# Patient Record
Sex: Female | Born: 1940 | Race: White | Hispanic: No | Marital: Married | Smoking: Former smoker
Health system: Southern US, Community
[De-identification: ages and names within clinical notes are randomized; demographics above are authoritative.]

## PROBLEM LIST (undated history)

## (undated) DIAGNOSIS — C7931 Secondary malignant neoplasm of brain: Secondary | ICD-10-CM

## (undated) DIAGNOSIS — I10 Essential (primary) hypertension: Secondary | ICD-10-CM

## (undated) DIAGNOSIS — C50919 Malignant neoplasm of unspecified site of unspecified female breast: Secondary | ICD-10-CM

## (undated) DIAGNOSIS — J45909 Unspecified asthma, uncomplicated: Secondary | ICD-10-CM

## (undated) DIAGNOSIS — C7800 Secondary malignant neoplasm of unspecified lung: Secondary | ICD-10-CM

## (undated) HISTORY — PX: BREAST LUMPECTOMY: SHX2

---

## 2016-01-03 ENCOUNTER — Other Ambulatory Visit: Payer: Self-pay | Admitting: Internal Medicine

## 2016-01-03 DIAGNOSIS — Z1231 Encounter for screening mammogram for malignant neoplasm of breast: Secondary | ICD-10-CM

## 2016-09-18 ENCOUNTER — Emergency Department (HOSPITAL_COMMUNITY): Payer: Medicare Other

## 2016-09-18 ENCOUNTER — Encounter (HOSPITAL_COMMUNITY): Payer: Self-pay | Admitting: Emergency Medicine

## 2016-09-18 ENCOUNTER — Inpatient Hospital Stay (HOSPITAL_COMMUNITY)
Admission: EM | Admit: 2016-09-18 | Discharge: 2016-09-24 | DRG: 054 | Disposition: A | Discharge status: Home Health | Payer: Medicare Other | Attending: Internal Medicine | Admitting: Internal Medicine

## 2016-09-18 DIAGNOSIS — C801 Malignant (primary) neoplasm, unspecified: Secondary | ICD-10-CM

## 2016-09-18 DIAGNOSIS — G936 Cerebral edema: Secondary | ICD-10-CM | POA: Diagnosis present

## 2016-09-18 DIAGNOSIS — R590 Localized enlarged lymph nodes: Secondary | ICD-10-CM | POA: Diagnosis present

## 2016-09-18 DIAGNOSIS — R112 Nausea with vomiting, unspecified: Secondary | ICD-10-CM

## 2016-09-18 DIAGNOSIS — Z923 Personal history of irradiation: Secondary | ICD-10-CM

## 2016-09-18 DIAGNOSIS — C7931 Secondary malignant neoplasm of brain: Principal | ICD-10-CM | POA: Diagnosis present

## 2016-09-18 DIAGNOSIS — I119 Hypertensive heart disease without heart failure: Secondary | ICD-10-CM | POA: Diagnosis present

## 2016-09-18 DIAGNOSIS — I495 Sick sinus syndrome: Secondary | ICD-10-CM | POA: Diagnosis present

## 2016-09-18 DIAGNOSIS — I4891 Unspecified atrial fibrillation: Secondary | ICD-10-CM

## 2016-09-18 DIAGNOSIS — H9203 Otalgia, bilateral: Secondary | ICD-10-CM | POA: Diagnosis present

## 2016-09-18 DIAGNOSIS — R600 Localized edema: Secondary | ICD-10-CM | POA: Diagnosis present

## 2016-09-18 DIAGNOSIS — Z7982 Long term (current) use of aspirin: Secondary | ICD-10-CM

## 2016-09-18 DIAGNOSIS — C7800 Secondary malignant neoplasm of unspecified lung: Secondary | ICD-10-CM | POA: Diagnosis present

## 2016-09-18 DIAGNOSIS — C50911 Malignant neoplasm of unspecified site of right female breast: Secondary | ICD-10-CM | POA: Diagnosis present

## 2016-09-18 DIAGNOSIS — J45909 Unspecified asthma, uncomplicated: Secondary | ICD-10-CM | POA: Diagnosis present

## 2016-09-18 DIAGNOSIS — D496 Neoplasm of unspecified behavior of brain: Secondary | ICD-10-CM | POA: Diagnosis present

## 2016-09-18 DIAGNOSIS — C799 Secondary malignant neoplasm of unspecified site: Secondary | ICD-10-CM

## 2016-09-18 DIAGNOSIS — Z79899 Other long term (current) drug therapy: Secondary | ICD-10-CM

## 2016-09-18 DIAGNOSIS — R278 Other lack of coordination: Secondary | ICD-10-CM | POA: Diagnosis present

## 2016-09-18 DIAGNOSIS — D259 Leiomyoma of uterus, unspecified: Secondary | ICD-10-CM | POA: Diagnosis present

## 2016-09-18 DIAGNOSIS — R001 Bradycardia, unspecified: Secondary | ICD-10-CM

## 2016-09-18 DIAGNOSIS — I6340 Cerebral infarction due to embolism of unspecified cerebral artery: Secondary | ICD-10-CM | POA: Diagnosis present

## 2016-09-18 DIAGNOSIS — Z853 Personal history of malignant neoplasm of breast: Secondary | ICD-10-CM

## 2016-09-18 DIAGNOSIS — I10 Essential (primary) hypertension: Secondary | ICD-10-CM

## 2016-09-18 DIAGNOSIS — R900 Intracranial space-occupying lesion found on diagnostic imaging of central nervous system: Secondary | ICD-10-CM

## 2016-09-18 DIAGNOSIS — I6523 Occlusion and stenosis of bilateral carotid arteries: Secondary | ICD-10-CM | POA: Diagnosis present

## 2016-09-18 DIAGNOSIS — I639 Cerebral infarction, unspecified: Secondary | ICD-10-CM

## 2016-09-18 HISTORY — DX: Unspecified asthma, uncomplicated: J45.909

## 2016-09-18 HISTORY — DX: Malignant neoplasm of unspecified site of unspecified female breast: C50.919

## 2016-09-18 HISTORY — DX: Essential (primary) hypertension: I10

## 2016-09-18 LAB — BASIC METABOLIC PANEL
ANION GAP: 9 (ref 5–15)
BUN: 12 mg/dL (ref 6–20)
CALCIUM: 9.6 mg/dL (ref 8.9–10.3)
CO2: 26 mmol/L (ref 22–32)
Chloride: 103 mmol/L (ref 101–111)
Creatinine, Ser: 0.74 mg/dL (ref 0.44–1.00)
GFR calc Af Amer: >60 mL/min (ref >60)
GFR calc non Af Amer: >60 mL/min (ref >60)
GLUCOSE: 112 mg/dL — AB (ref 65–99)
POTASSIUM: 3.6 mmol/L (ref 3.5–5.1)
Sodium: 138 mmol/L (ref 135–145)

## 2016-09-18 LAB — URINALYSIS, ROUTINE W REFLEX MICROSCOPIC
BACTERIA UA: NONE SEEN
Bilirubin Urine: NEGATIVE
GLUCOSE, UA: NEGATIVE mg/dL
KETONES UR: 80 mg/dL — AB
Nitrite: NEGATIVE
PROTEIN: 30 mg/dL — AB
Specific Gravity, Urine: 1.027 (ref 1.005–1.030)
pH: 5 (ref 5.0–8.0)

## 2016-09-18 LAB — RAPID STREP SCREEN (MED CTR MEBANE ONLY): Streptococcus, Group A Screen (Direct): NEGATIVE

## 2016-09-18 LAB — CBC
HEMATOCRIT: 43.3 % (ref 36.0–46.0)
HEMOGLOBIN: 14.9 g/dL (ref 12.0–15.0)
MCH: 32.2 pg (ref 26.0–34.0)
MCHC: 34.4 g/dL (ref 30.0–36.0)
MCV: 93.5 fL (ref 78.0–100.0)
Platelets: 286 10*3/uL (ref 150–400)
RBC: 4.63 MIL/uL (ref 3.87–5.11)
RDW: 12.1 % (ref 11.5–15.5)
WBC: 10.8 10*3/uL — ABNORMAL HIGH (ref 4.0–10.5)

## 2016-09-18 LAB — I-STAT TROPONIN, ED: Troponin i, poc: 0 ng/mL (ref 0.00–0.08)

## 2016-09-18 LAB — CBG MONITORING, ED: Glucose-Capillary: 107 mg/dL — ABNORMAL HIGH (ref 65–99)

## 2016-09-18 MED ORDER — FENTANYL CITRATE (PF) 100 MCG/2ML IJ SOLN
50.0000 ug | Freq: Once | INTRAMUSCULAR | Status: AC
  Administered 2016-09-18: 50 ug via INTRAVENOUS
  Filled 2016-09-18: qty 2

## 2016-09-18 MED ORDER — SODIUM CHLORIDE 0.9 % IV BOLUS (SEPSIS)
1000.0000 mL | Freq: Once | INTRAVENOUS | Status: AC
  Administered 2016-09-18: 1000 mL via INTRAVENOUS

## 2016-09-18 MED ORDER — ONDANSETRON HCL 4 MG/2ML IJ SOLN
4.0000 mg | Freq: Once | INTRAMUSCULAR | Status: AC
  Administered 2016-09-18: 4 mg via INTRAVENOUS
  Filled 2016-09-18: qty 2

## 2016-09-18 NOTE — ED Provider Notes (Signed)
[facility] DEPT Provider Note   CSN: [id number] Arrival date & time: 09/18/16  1533     History   Chief Complaint Chief Complaint  Patient presents with  . Emesis  . Generalized Body Aches  . Weakness    HPI Deanna Washington is a 76 y.o. female past medical history of asthma who presents with 3 days of generalized body aches/weakness, chills, nausea/vomiting. Patient states that she has not been able to tolerate any PO. She reports that she has had multiple episodes of nonbloody, nonbilious vomiting since onset. Patient reports that she also experiencedgeneralized weakness associated from the aches. No focal weakness complaints. Patient also reports gradually worsening headache that has been ongoing for the past few days. She states that headache is worsened when laying down and with positional changes. Patient states that she has not taken any medications for the pain. Patient reports that initially when symptoms began, she began experiencing some dizziness but states that has improved. Patient states that she also aches around her bilateral jaw, face and ears. She reports some bilateral ear pain, right greater than left. She reports that her granddaughter was recently diagnosed with strep and she had been around. Patient denies any fever, chest pain, difficulty breathing, abdominal pain, diarrhea, melena, dysuria, hematuria.  The history is provided by the patient.    Past Medical History:  Diagnosis Date  . Asthma     Patient Active Problem List   Diagnosis Date Noted  . Brain tumor ([place]) 09/19/2016    History reviewed. No pertinent surgical history.  OB History    No data available       Home Medications    Prior to Admission medications   Medication Sig Start Date End Date Taking? Authorizing Provider  ADVAIR DISKUS 500-50 MCG/DOSE AEPB Place 1 puff into alternate nostrils 2 (two) times daily. 08/23/16  Yes [provider]  aspirin 325 MG tablet Take 325  mg by mouth once.   Yes [provider]  diphenhydrAMINE (BENADRYL) 25 mg capsule Take 25 mg by mouth every 6 (six) hours as needed for sleep.   Yes [provider]  losartan-hydrochlorothiazide (HYZAAR) 50-12.5 MG tablet Take 1 tablet by mouth daily. 07/06/16  Yes [provider]  PROAIR HFA 108 (90 Base) MCG/ACT inhaler Place 1-2 puffs into alternate nostrils every 4 (four) hours as needed for wheezing. 08/01/16  Yes [provider]    Family History No family history on file.  Social History Social History  Substance Use Topics  . Smoking status: Never Smoker  . Smokeless tobacco: Never Used  . Alcohol use Not on file     Allergies   Patient has no known allergies.   Review of Systems Review of Systems  Constitutional: Negative for chills and fever.  HENT: Positive for ear pain. Negative for congestion and sore throat.   Eyes: Negative for visual disturbance.  Respiratory: Negative for cough and shortness of breath.   Cardiovascular: Negative for chest pain.  Gastrointestinal: Positive for nausea and vomiting. Negative for abdominal pain and diarrhea.  Genitourinary: Negative for dysuria and hematuria.  Musculoskeletal: Negative for back pain and neck pain.  Skin: Negative for rash.  Neurological: Positive for dizziness (improved), weakness (Generalized) and headaches. Negative for numbness.  Psychiatric/Behavioral: Negative for confusion.  All other systems reviewed and are negative.    Physical Exam Updated Vital Signs BP (!) 144/86 (BP Location: Right Arm)   Pulse (!) 51   Temp 98.1 F (36.7 C) (Oral)  Resp 19   Ht 5\' 8"  (1.727 m)   Wt 67.1 kg (148 lb)   SpO2 97%   BMI 22.50 kg/m   Physical Exam  Constitutional: She is oriented to person, place, and time. She appears well-developed and well-nourished.  Appears uncomfortable but no acute distress  HENT:  Head: Normocephalic and atraumatic.  Right Ear: Tympanic membrane  normal.  Left Ear: Tympanic membrane normal.  Mouth/Throat: Oropharynx is clear and moist and mucous membranes are normal.  Bilateral external and auditory canals are erythematous.  Eyes: Pupils are equal, round, and reactive to light. Conjunctivae, EOM and lids are normal.  Neck: Full passive range of motion without pain.  Cardiovascular: Normal rate, regular rhythm, normal heart sounds and normal pulses.  Exam reveals no gallop and no friction rub.   No murmur heard. Pulmonary/Chest: Effort normal and breath sounds normal.  Abdominal: Soft. Normal appearance. There is no tenderness. There is no rigidity and no guarding.  Musculoskeletal: Normal range of motion.  Neurological: She is alert and oriented to person, place, and time.  Cranial nerves III-XII intact Follows commands, Moves all extremities  5/5 strength to BUE and BLE  Sensation intact throughout all major nerve distributions Normal finger to nose. No dysdiadochokinesia. No pronator drift. No slurred speech. No facial droop.   Skin: Skin is warm and dry. Capillary refill takes less than 2 seconds.  Psychiatric: She has a normal mood and affect. Her speech is normal.  Nursing note and vitals reviewed.    ED Treatments / Results  Labs (all labs ordered are listed, but only abnormal results are displayed) Labs Reviewed  BASIC METABOLIC PANEL - Abnormal; Notable for the following:       Result Value   Glucose, Bld 112 (*)    All other components within normal limits  CBC - Abnormal; Notable for the following:    WBC 10.8 (*)    All other components within normal limits  URINALYSIS, ROUTINE W REFLEX MICROSCOPIC - Abnormal; Notable for the following:    Color, Urine AMBER (*)    APPearance HAZY (*)    Hgb urine dipstick SMALL (*)    Ketones, ur 80 (*)    Protein, ur 30 (*)    Leukocytes, UA MODERATE (*)    Squamous Epithelial / LPF 0-5 (*)    All other components within normal limits  CBG MONITORING, ED - Abnormal;  Notable for the following:    Glucose-Capillary 107 (*)    All other components within normal limits  RAPID STREP SCREEN (NOT AT [facility])  CULTURE, GROUP A STREP [facility])  ALT  AST  LIPASE, BLOOD  COMPREHENSIVE METABOLIC PANEL  CBC  I-STAT TROPONIN, ED    EKG  EKG Interpretation  Date/Time:  Wednesday September 18 2016 16:03:55 EDT Ventricular Rate:  59 PR Interval:    QRS Duration: 100 QT Interval:  426 QTC Calculation: 422 R Axis:   116 Text Interpretation:  Sinus rhythm Anteroseptal infarct, age indeterminate No acute changes No significant change since last tracing Confirmed by Varney Biles [phone]) on 09/18/2016 6:53:19 PM       Radiology Dg Chest 2 View  Result Date: 09/18/2016 CLINICAL DATA:  Generalized body aches, fever, chills, nausea and vomiting for 3 days. EXAM: CHEST  2 VIEW COMPARISON:  None. FINDINGS: There is cardiomegaly and vascular congestion. No consolidative process or pneumothorax. Trace bilateral pleural effusions noted. No acute bony abnormality. IMPRESSION: Cardiomegaly and pulmonary vascular congestion. Trace bilateral pleural effusions. Electronically Signed  By: Inge Rise M.D.   On: 09/18/2016 19:53   Ct Head Wo Contrast  Result Date: 09/18/2016 CLINICAL DATA:  Generalized body aches, fever, chills, nausea and vomiting for 3 days. RIGHT eye and ear pain. EXAM: CT HEAD WITHOUT CONTRAST TECHNIQUE: Contiguous axial images were obtained from the base of the skull through the vertex without intravenous contrast. COMPARISON:  None. FINDINGS: BRAIN: 4.3 x 7.6 cm rounded hypodensity with heterogeneous component RIGHT prop occipital lobe effacing the RIGHT atrium. Surrounding apparent vasogenic edema. Local mass-effect, 3 mm RIGHT to LEFT midline shift. No intraparenchymal hemorrhage. No hydrocephalus. No abnormal extra-axial fluid collections. VASCULAR: Moderate calcific atherosclerosis of the carotid siphons. SKULL: No skull fracture. No significant scalp  soft tissue swelling. SINUSES/ORBITS: Trace paranasal sinus mucosal thickening. Mastoid air cells are well aerated. The included ocular globes and orbital contents are non-suspicious. Status post bilateral ocular lens implants. OTHER: None. IMPRESSION: 1. Large RIGHT parieto-occipital masslike hypodensity, differential diagnosis includes tumor/metastasis, cerebritis or less likely subacute MCA/ posterior watershed territory infarct. 3 mm RIGHT to LEFT midline shift. Recommend MRI of the brain with and without contrast. Acute findings discussed with and reconfirmed by [facility] NANAVATI on 09/18/2016 at 10:20 pm. Electronically Signed   By: Elon Alas M.D.   On: 09/18/2016 22:23    Procedures Procedures (including critical care time)  Medications Ordered in ED Medications  dexamethasone (DECADRON) injection 4 mg (not administered)  acetaminophen (TYLENOL) tablet 650 mg (not administered)  morphine 2 MG/ML injection 2 mg (not administered)  ondansetron (ZOFRAN) injection 4 mg (not administered)  pantoprazole (PROTONIX) injection 40 mg (not administered)  mometasone-formoterol (DULERA) 200-5 MCG/ACT inhaler 2 puff (not administered)  diphenhydrAMINE (BENADRYL) capsule 25 mg (not administered)  losartan-hydrochlorothiazide (HYZAAR) 50-12.5 MG per tablet 1 tablet (not administered)  albuterol (PROVENTIL) (2.5 MG/3ML) 0.083% nebulizer solution 3 mL (not administered)  enoxaparin (LOVENOX) injection 40 mg (not administered)  0.9 %  sodium chloride infusion (not administered)  ondansetron (ZOFRAN) injection 4 mg (4 mg Intravenous Given 09/18/16 1730)  sodium chloride 0.9 % bolus 1,000 mL (0 mLs Intravenous Stopped 09/18/16 1852)  fentaNYL (SUBLIMAZE) injection 50 mcg (50 mcg Intravenous Given 09/18/16 2123)     Initial Impression / Assessment and Plan / ED Course  I have reviewed the triage vital signs and the nursing notes.  Pertinent labs & imaging results that were available during my care  of the patient were reviewed by me and considered in my medical decision making (see chart for details).     76 y.o. female who presents with 2 days of generalized weakness, nausea/vomiting. Also associated with gradually worsening headache for the last 3 days. No focal neuro complaints. No history of trauma/fall.  Patient is afebrile, non-toxic appearing, sitting comfortably on examination table. Vital signs reviewed and stable. Patient is hypertensive. She has not taken any hypertensive medications today. No focal neuro deficits. No abdominal tenderness on exam. Consider vertigo versus dehydration versus electrolyte imbalance versus acute infectious etiology. History/physical exam are not concerning for meningitis. Plan to check basic labs including CBC, BMP, UA, troponin, EKG, rapid strep, CXR. IVF given for fluid resuscitation. Antiemetics given in the department.  Labs and imaging reviewed. CBC shows slight leukocytosis. BMP is unremarkable. Troponin negative. Rapid strep negative. EKG shows sinus bradycardia, rate 59. No priors for comparison. Chest x-ray shows cardiomegaly but no acute infectious etiology.  Discussed results with patient. She reports that she has had no more episodes of vomiting while in the department. Will plan to  fluid challenge her. Patient reports that she is still expressing headache. Will plan to give analgesics for pain relief. Discussed patient with Dr. Kathrynn Humble. Will plan to evaluate patient in the ED.   Discussed with Dr. Kathrynn Humble after evaluation of the patient in the ED. during his exam, patient was slightly tremulous with finger to nose. Patient and played in the department but had an unsteady gait. Given neuro findings and history of worsening headache, Will order a CT head for further evaluation.  CT head reviewed. There is evidence of a large right parieto-occipital masslike hypodensity that could indicate tumor, metastasis, there is possibility that it could be a  subacute MCA/posterior watershed infarct but that is less likely. There is also a 3 mm right and left midline shift. There is recommendation for further MRI evaluation. Given findings on CT and patient's symptoms, will likely need admission for further workup. Plan to consult hospitalist for further admission.  Discussed patient with Dr. Albertine Grates. Would like to have neurosurgery consulted before admission for their recommendation. Will consult neurosurgery.  Discussed patient with Dr. Saintclair Halsted (neurosurgery). Regarding CT findings. He agrees that patient will likely need admission for further MRI evaluation of the brain. Will plan to consult on patient tomorrow during admission.  Discussed with Dr. Albertine Grates. Would like oncology consultation for their recommendation for Decadron dosing and further suggestion.  Discussed patient with Dr. Alen Blew (Oncology). Agrees with plan to admit. Recommends that patient have an MRI of the brain for further evaluation of the mass found on CT. Also recommends full body CT scan for evaluation of metastasis. Would like neurosurgery to consult on patient during admission. He will plan to consult on patient after patient has had medical workup and neurosurgery consultation. He recommends that patient be started on Decadron 4 mg every 6 hours.  Discussed consult with oncology with Dr. Albertine Grates.    Final Clinical Impressions(s) / ED Diagnoses   Final diagnoses:  Intracranial mass  Nausea and vomiting, intractability of vomiting not specified, unspecified vomiting type    New Prescriptions New Prescriptions   No medications on file     Desma Mcgregor 09/19/16 Louise, Ankit, MD 09/19/16 [phone]

## 2016-09-18 NOTE — ED Triage Notes (Signed)
Patient here from home with complaints of generalized body aches, fever, chills, nausea/vomiting x3 days. Pain increased in right ear and behind right eye.

## 2016-09-19 ENCOUNTER — Encounter (HOSPITAL_COMMUNITY): Payer: Self-pay | Admitting: Radiology

## 2016-09-19 ENCOUNTER — Ambulatory Visit
Admit: 2016-09-19 | Discharge: 2016-09-19 | Disposition: A | Discharge status: Home / Self Care | Payer: Medicare Other | Source: Ambulatory Visit | Attending: Radiation Oncology | Admitting: Radiation Oncology

## 2016-09-19 ENCOUNTER — Inpatient Hospital Stay (HOSPITAL_COMMUNITY): Payer: Medicare Other

## 2016-09-19 DIAGNOSIS — R900 Intracranial space-occupying lesion found on diagnostic imaging of central nervous system: Secondary | ICD-10-CM

## 2016-09-19 DIAGNOSIS — R001 Bradycardia, unspecified: Secondary | ICD-10-CM

## 2016-09-19 DIAGNOSIS — R51 Headache: Secondary | ICD-10-CM | POA: Diagnosis not present

## 2016-09-19 DIAGNOSIS — C799 Secondary malignant neoplasm of unspecified site: Secondary | ICD-10-CM | POA: Diagnosis not present

## 2016-09-19 DIAGNOSIS — M7989 Other specified soft tissue disorders: Secondary | ICD-10-CM

## 2016-09-19 DIAGNOSIS — I639 Cerebral infarction, unspecified: Secondary | ICD-10-CM

## 2016-09-19 DIAGNOSIS — I495 Sick sinus syndrome: Secondary | ICD-10-CM

## 2016-09-19 DIAGNOSIS — I361 Nonrheumatic tricuspid (valve) insufficiency: Secondary | ICD-10-CM | POA: Diagnosis not present

## 2016-09-19 DIAGNOSIS — H9203 Otalgia, bilateral: Secondary | ICD-10-CM | POA: Diagnosis present

## 2016-09-19 DIAGNOSIS — D496 Neoplasm of unspecified behavior of brain: Secondary | ICD-10-CM

## 2016-09-19 DIAGNOSIS — J45909 Unspecified asthma, uncomplicated: Secondary | ICD-10-CM

## 2016-09-19 DIAGNOSIS — I10 Essential (primary) hypertension: Secondary | ICD-10-CM | POA: Diagnosis not present

## 2016-09-19 DIAGNOSIS — I119 Hypertensive heart disease without heart failure: Secondary | ICD-10-CM | POA: Diagnosis present

## 2016-09-19 DIAGNOSIS — B370 Candidal stomatitis: Secondary | ICD-10-CM | POA: Insufficient documentation

## 2016-09-19 DIAGNOSIS — C7800 Secondary malignant neoplasm of unspecified lung: Secondary | ICD-10-CM | POA: Diagnosis present

## 2016-09-19 DIAGNOSIS — R278 Other lack of coordination: Secondary | ICD-10-CM | POA: Diagnosis present

## 2016-09-19 DIAGNOSIS — Z923 Personal history of irradiation: Secondary | ICD-10-CM | POA: Diagnosis not present

## 2016-09-19 DIAGNOSIS — I638 Other cerebral infarction: Secondary | ICD-10-CM | POA: Diagnosis not present

## 2016-09-19 DIAGNOSIS — R42 Dizziness and giddiness: Secondary | ICD-10-CM | POA: Diagnosis not present

## 2016-09-19 DIAGNOSIS — G939 Disorder of brain, unspecified: Secondary | ICD-10-CM | POA: Diagnosis not present

## 2016-09-19 DIAGNOSIS — Z853 Personal history of malignant neoplasm of breast: Secondary | ICD-10-CM | POA: Diagnosis not present

## 2016-09-19 DIAGNOSIS — J90 Pleural effusion, not elsewhere classified: Secondary | ICD-10-CM | POA: Diagnosis not present

## 2016-09-19 DIAGNOSIS — I4891 Unspecified atrial fibrillation: Secondary | ICD-10-CM | POA: Diagnosis not present

## 2016-09-19 DIAGNOSIS — Z7982 Long term (current) use of aspirin: Secondary | ICD-10-CM | POA: Diagnosis not present

## 2016-09-19 DIAGNOSIS — Z79899 Other long term (current) drug therapy: Secondary | ICD-10-CM | POA: Diagnosis not present

## 2016-09-19 DIAGNOSIS — R600 Localized edema: Secondary | ICD-10-CM | POA: Diagnosis present

## 2016-09-19 DIAGNOSIS — C50911 Malignant neoplasm of unspecified site of right female breast: Secondary | ICD-10-CM | POA: Diagnosis present

## 2016-09-19 DIAGNOSIS — C7931 Secondary malignant neoplasm of brain: Secondary | ICD-10-CM | POA: Diagnosis present

## 2016-09-19 DIAGNOSIS — R599 Enlarged lymph nodes, unspecified: Secondary | ICD-10-CM | POA: Diagnosis not present

## 2016-09-19 DIAGNOSIS — R112 Nausea with vomiting, unspecified: Secondary | ICD-10-CM | POA: Diagnosis not present

## 2016-09-19 DIAGNOSIS — R918 Other nonspecific abnormal finding of lung field: Secondary | ICD-10-CM | POA: Diagnosis not present

## 2016-09-19 DIAGNOSIS — G936 Cerebral edema: Secondary | ICD-10-CM | POA: Diagnosis present

## 2016-09-19 DIAGNOSIS — Z7951 Long term (current) use of inhaled steroids: Secondary | ICD-10-CM | POA: Insufficient documentation

## 2016-09-19 DIAGNOSIS — D259 Leiomyoma of uterus, unspecified: Secondary | ICD-10-CM | POA: Diagnosis present

## 2016-09-19 DIAGNOSIS — I6340 Cerebral infarction due to embolism of unspecified cerebral artery: Secondary | ICD-10-CM | POA: Diagnosis present

## 2016-09-19 DIAGNOSIS — J4530 Mild persistent asthma, uncomplicated: Secondary | ICD-10-CM

## 2016-09-19 DIAGNOSIS — C50919 Malignant neoplasm of unspecified site of unspecified female breast: Secondary | ICD-10-CM | POA: Diagnosis not present

## 2016-09-19 DIAGNOSIS — R590 Localized enlarged lymph nodes: Secondary | ICD-10-CM | POA: Diagnosis present

## 2016-09-19 DIAGNOSIS — I6523 Occlusion and stenosis of bilateral carotid arteries: Secondary | ICD-10-CM | POA: Diagnosis present

## 2016-09-19 DIAGNOSIS — F40240 Claustrophobia: Secondary | ICD-10-CM | POA: Insufficient documentation

## 2016-09-19 LAB — COMPREHENSIVE METABOLIC PANEL
ALBUMIN: 3.7 g/dL (ref 3.5–5.0)
ALK PHOS: 61 U/L (ref 38–126)
ALT: 18 U/L (ref 14–54)
ANION GAP: 6 (ref 5–15)
AST: 21 U/L (ref 15–41)
BUN: 12 mg/dL (ref 6–20)
CALCIUM: 8.9 mg/dL (ref 8.9–10.3)
CHLORIDE: 105 mmol/L (ref 101–111)
CO2: 27 mmol/L (ref 22–32)
CREATININE: 0.78 mg/dL (ref 0.44–1.00)
GFR calc non Af Amer: >60 mL/min (ref >60)
GLUCOSE: 126 mg/dL — AB (ref 65–99)
Potassium: 3.8 mmol/L (ref 3.5–5.1)
SODIUM: 138 mmol/L (ref 135–145)
Total Bilirubin: 0.6 mg/dL (ref 0.3–1.2)
Total Protein: 6.3 g/dL — ABNORMAL LOW (ref 6.5–8.1)

## 2016-09-19 LAB — CBC
HCT: 40.6 % (ref 36.0–46.0)
HEMOGLOBIN: 13.7 g/dL (ref 12.0–15.0)
MCH: 32.4 pg (ref 26.0–34.0)
MCHC: 33.7 g/dL (ref 30.0–36.0)
MCV: 96 fL (ref 78.0–100.0)
Platelets: 261 10*3/uL (ref 150–400)
RBC: 4.23 MIL/uL (ref 3.87–5.11)
RDW: 12.4 % (ref 11.5–15.5)
WBC: 11.1 10*3/uL — ABNORMAL HIGH (ref 4.0–10.5)

## 2016-09-19 LAB — TSH: TSH: 0.43 u[IU]/mL (ref 0.350–4.500)

## 2016-09-19 LAB — MRSA PCR SCREENING: MRSA by PCR: NEGATIVE

## 2016-09-19 LAB — TROPONIN I

## 2016-09-19 LAB — GLUCOSE, CAPILLARY: Glucose-Capillary: 116 mg/dL — ABNORMAL HIGH (ref 65–99)

## 2016-09-19 LAB — T4, FREE: FREE T4: 0.9 ng/dL (ref 0.61–1.12)

## 2016-09-19 LAB — LIPASE, BLOOD: LIPASE: 29 U/L (ref 11–51)

## 2016-09-19 MED ORDER — AMIODARONE HCL IN DEXTROSE 360-4.14 MG/200ML-% IV SOLN
60.0000 mg/h | INTRAVENOUS | Status: DC
Start: 2016-09-19 — End: 2016-09-19

## 2016-09-19 MED ORDER — IOPAMIDOL (ISOVUE-300) INJECTION 61%
INTRAVENOUS | Status: AC
  Administered 2016-09-19: 14:00:00 via INTRAVENOUS
  Filled 2016-09-19: qty 100

## 2016-09-19 MED ORDER — SODIUM CHLORIDE 0.9 % IV SOLN
INTRAVENOUS | Status: DC
  Administered 2016-09-19 – 2016-09-21 (×3): via INTRAVENOUS

## 2016-09-19 MED ORDER — DEXAMETHASONE SODIUM PHOSPHATE 4 MG/ML IJ SOLN
10.0000 mg | Freq: Four times a day (QID) | INTRAMUSCULAR | Status: DC
  Administered 2016-09-19 – 2016-09-21 (×9): 10 mg via INTRAVENOUS
  Administered 2016-09-21: 8 mg via INTRAVENOUS
  Administered 2016-09-21 – 2016-09-24 (×10): 10 mg via INTRAVENOUS
  Filled 2016-09-19 (×19): qty 3

## 2016-09-19 MED ORDER — IOPAMIDOL (ISOVUE-300) INJECTION 61%
100.0000 mL | Freq: Once | INTRAVENOUS | Status: AC | PRN
  Administered 2016-09-19: 100 mL via INTRAVENOUS

## 2016-09-19 MED ORDER — STROKE: EARLY STAGES OF RECOVERY BOOK
Freq: Once | Status: AC
  Administered 2016-09-19: 08:00:00
  Filled 2016-09-19: qty 1

## 2016-09-19 MED ORDER — ONDANSETRON HCL 4 MG/2ML IJ SOLN
4.0000 mg | Freq: Four times a day (QID) | INTRAMUSCULAR | Status: DC | PRN
  Administered 2016-09-19 (×2): 4 mg via INTRAVENOUS
  Filled 2016-09-19 (×2): qty 2

## 2016-09-19 MED ORDER — AMIODARONE HCL 200 MG PO TABS
200.0000 mg | ORAL_TABLET | Freq: Two times a day (BID) | ORAL | Status: DC
  Administered 2016-09-21 – 2016-09-23 (×3): 200 mg via ORAL
  Filled 2016-09-19 (×3): qty 1

## 2016-09-19 MED ORDER — DEXAMETHASONE SODIUM PHOSPHATE 4 MG/ML IJ SOLN
4.0000 mg | Freq: Four times a day (QID) | INTRAMUSCULAR | Status: DC
  Administered 2016-09-19 (×2): 4 mg via INTRAVENOUS
  Filled 2016-09-19 (×2): qty 1

## 2016-09-19 MED ORDER — IOPAMIDOL (ISOVUE-300) INJECTION 61%: 15.0000 mL | Freq: Once | INTRAVENOUS | Status: DC | PRN

## 2016-09-19 MED ORDER — IOPAMIDOL (ISOVUE-300) INJECTION 61%
INTRAVENOUS | Status: AC
  Administered 2016-09-19: 08:00:00
  Filled 2016-09-19: qty 30

## 2016-09-19 MED ORDER — ASPIRIN 300 MG RE SUPP: 300.0000 mg | Freq: Every day | RECTAL | Status: DC

## 2016-09-19 MED ORDER — DIPHENHYDRAMINE HCL 25 MG PO CAPS: 25.0000 mg | ORAL_CAPSULE | Freq: Four times a day (QID) | ORAL | Status: DC | PRN

## 2016-09-19 MED ORDER — PANTOPRAZOLE SODIUM 40 MG IV SOLR
40.0000 mg | Freq: Every day | INTRAVENOUS | Status: DC
  Administered 2016-09-19 – 2016-09-20 (×3): 40 mg via INTRAVENOUS
  Filled 2016-09-19 (×3): qty 40

## 2016-09-19 MED ORDER — ALBUTEROL SULFATE (2.5 MG/3ML) 0.083% IN NEBU: 3.0000 mL | INHALATION_SOLUTION | RESPIRATORY_TRACT | Status: DC | PRN

## 2016-09-19 MED ORDER — AMIODARONE HCL IN DEXTROSE 360-4.14 MG/200ML-% IV SOLN: 30.0000 mg/h | INTRAVENOUS | Status: DC

## 2016-09-19 MED ORDER — MOMETASONE FURO-FORMOTEROL FUM 200-5 MCG/ACT IN AERO
2.0000 | INHALATION_SPRAY | Freq: Two times a day (BID) | RESPIRATORY_TRACT | Status: DC
  Administered 2016-09-19 – 2016-09-24 (×11): 2 via RESPIRATORY_TRACT
  Filled 2016-09-19: qty 8.8

## 2016-09-19 MED ORDER — LEVETIRACETAM 500 MG PO TABS
500.0000 mg | ORAL_TABLET | Freq: Two times a day (BID) | ORAL | Status: DC
  Administered 2016-09-19 – 2016-09-24 (×11): 500 mg via ORAL
  Filled 2016-09-19 (×11): qty 1

## 2016-09-19 MED ORDER — ACETAMINOPHEN 325 MG PO TABS
650.0000 mg | ORAL_TABLET | Freq: Four times a day (QID) | ORAL | Status: DC | PRN
Start: 2016-09-19 — End: 2016-09-24
  Administered 2016-09-19 – 2016-09-24 (×5): 650 mg via ORAL
  Filled 2016-09-19 (×5): qty 2

## 2016-09-19 MED ORDER — GADOBENATE DIMEGLUMINE 529 MG/ML IV SOLN
13.0000 mL | Freq: Once | INTRAVENOUS | Status: AC | PRN
  Administered 2016-09-19: 13 mL via INTRAVENOUS

## 2016-09-19 MED ORDER — ENOXAPARIN SODIUM 40 MG/0.4ML ~~LOC~~ SOLN
40.0000 mg | SUBCUTANEOUS | Status: DC
  Administered 2016-09-20 – 2016-09-23 (×4): 40 mg via SUBCUTANEOUS
  Filled 2016-09-19 (×5): qty 0.4

## 2016-09-19 MED ORDER — MORPHINE SULFATE (PF) 2 MG/ML IV SOLN
2.0000 mg | INTRAVENOUS | Status: DC | PRN
  Administered 2016-09-19 (×2): 2 mg via INTRAVENOUS
  Filled 2016-09-19 (×3): qty 1

## 2016-09-19 MED ORDER — LOSARTAN POTASSIUM 50 MG PO TABS
50.0000 mg | ORAL_TABLET | Freq: Every day | ORAL | Status: DC
  Administered 2016-09-19 – 2016-09-23 (×5): 50 mg via ORAL
  Filled 2016-09-19 (×5): qty 1

## 2016-09-19 MED ORDER — AMIODARONE LOAD VIA INFUSION
150.0000 mg | Freq: Once | INTRAVENOUS | Status: DC
  Filled 2016-09-19: qty 83.34

## 2016-09-19 MED ORDER — HYDROCHLOROTHIAZIDE 12.5 MG PO CAPS
12.5000 mg | ORAL_CAPSULE | Freq: Every day | ORAL | Status: DC
  Administered 2016-09-19 – 2016-09-23 (×5): 12.5 mg via ORAL
  Filled 2016-09-19 (×5): qty 1

## 2016-09-19 MED ORDER — ASPIRIN 325 MG PO TABS
325.0000 mg | ORAL_TABLET | Freq: Every day | ORAL | Status: DC
  Filled 2016-09-19: qty 1

## 2016-09-19 MED ORDER — ENOXAPARIN SODIUM 40 MG/0.4ML ~~LOC~~ SOLN
40.0000 mg | SUBCUTANEOUS | Status: DC
  Administered 2016-09-19: 40 mg via SUBCUTANEOUS
  Filled 2016-09-19: qty 0.4

## 2016-09-19 MED ORDER — LOSARTAN POTASSIUM-HCTZ 50-12.5 MG PO TABS: 1.0000 | ORAL_TABLET | Freq: Every day | ORAL | Status: DC

## 2016-09-19 NOTE — H&P (Addendum)
History and Physical  Maram Bently IWP:[id number] DOB: 1940/07/30 DOA: 09/18/2016  PCP:  Haywood Pao, MD   Chief Complaint:  Headache vomiting  History of Present Illness:  Pt is a 76 yo female with hx of breast cancer s/p lumpectomy and radiation 8 years ago ( in remission) and asthma who came with cc of headache and nausea/vomiting for the past 3-4 days. Headache is generalized and moderate to severe. Vomitus is non bloody. She also has abdominal discomfort with vomiting. No diarrhea/constipation. No other complaints. No focal motor deficits or sensory abnormalities or slurred speech or aphasia. She also has  Been feeling dizzy. ED called for admission. I requested neurosurgery evaluation/consult. ED called back that neurosurgery advised admitting to medicine and pt will be seen in am. No recommendations regarding medical management overnight were given. I asked for oncology evaluation as well. Oncology recs to give decadron 4 mg IV Q6H.   Review of Systems:  CONSTITUTIONAL:     No night sweats.  No fatigue.  No fever. No chills. Eyes:                            No visual changes.  No eye pain.  No eye discharge.   ENT:                              No epistaxis.  No sinus pain.  No sore throat.   No congestion. RESPIRATORY:           No cough.  No wheeze.  No hemoptysis.  No dyspnea CARDIOVASCULAR   :  No chest pains.  No palpitations. GASTROINTESTINAL:  +abdominal pain.  +nausea. +vomiting.  No diarrhea. No constipation.  No hematemesis.  No hematochezia.  No melena. GENITOURINARY:      No urgency.  No frequency.  No dysuria.  No hematuria.  No obstructive symptoms.  No discharge.  No pain.   MUSCULOSKELETAL:  No musculoskeletal pain.  No joint swelling.  No arthritis. NEUROLOGICAL:        No confusion.  No weakness. +headache. No seizure. PSYCHIATRIC:             No depression. No anxiety. No suicidal ideation. SKIN:                             No rashes.  No lesions.   No wounds. ENDOCRINE:                No weight loss.  No polydipsia.  No polyuria.  No polyphagia. HEMATOLOGIC:           No purpura.  No petechiae.  No bleeding.  ALLERGIC                 : No pruritus.  No angioedema Other:  Past Medical and Surgical History:   Past Medical History:  Diagnosis Date  . Asthma    History reviewed. No pertinent surgical history.  Social History:   reports that she has never smoked. She has never used smokeless tobacco. Her alcohol and drug histories are not on file.    No Known Allergies  No family history on file.    Prior to Admission medications   Medication Sig Start Date End Date Taking? Authorizing Provider  ADVAIR DISKUS 500-50 MCG/DOSE AEPB Place 1 puff into alternate nostrils 2 (  two) times daily. 08/23/16  Yes [provider]  aspirin 325 MG tablet Take 325 mg by mouth once.   Yes [provider]  diphenhydrAMINE (BENADRYL) 25 mg capsule Take 25 mg by mouth every 6 (six) hours as needed for sleep.   Yes [provider]  losartan-hydrochlorothiazide (HYZAAR) 50-12.5 MG tablet Take 1 tablet by mouth daily. 07/06/16  Yes [provider]  PROAIR HFA 108 (90 Base) MCG/ACT inhaler Place 1-2 puffs into alternate nostrils every 4 (four) hours as needed for wheezing. 08/01/16  Yes [provider]    Physical Exam: BP (!) 144/86 (BP Location: Right Arm)   Pulse (!) 51   Temp 98.1 F (36.7 C) (Oral)   Resp 19   Ht 5\' 8"  (1.727 m)   Wt 67.1 kg (148 lb)   SpO2 97%   BMI 22.50 kg/m   GENERAL :   Alert and cooperative, and appears to be in mild acute distress. HEAD:           normocephalic. EYES:            PERRL, EOMI.  vision is grossly intact. EARS:           hearing grossly intact.  NECK:          supple CARDIAC:    Normal S1 and S2. No gallop. No murmurs.  Vascular:     no peripheral edema.  LUNGS:       Clear to auscultation  ABDOMEN: Positive bowel sounds. Soft, nondistended, nontender. No  guarding or rebound.      MSK:           No joint erythema or tenderness.  EXT           : No significant deformity or joint abnormality. Neuro        : Alert, oriented to person, place, and time.                      CN II-XII intact.  SKIN:            No rash. No lesions. PSYCH:       No hallucination. Patient is not suicidal.          Labs on Admission:  Reviewed.   Radiological Exams on Admission: Dg Chest 2 View  Result Date: 09/18/2016 CLINICAL DATA:  Generalized body aches, fever, chills, nausea and vomiting for 3 days. EXAM: CHEST  2 VIEW COMPARISON:  None. FINDINGS: There is cardiomegaly and vascular congestion. No consolidative process or pneumothorax. Trace bilateral pleural effusions noted. No acute bony abnormality. IMPRESSION: Cardiomegaly and pulmonary vascular congestion. Trace bilateral pleural effusions. Electronically Signed   By: Inge Rise M.D.   On: 09/18/2016 19:53   Ct Head Wo Contrast  Result Date: 09/18/2016 CLINICAL DATA:  Generalized body aches, fever, chills, nausea and vomiting for 3 days. RIGHT eye and ear pain. EXAM: CT HEAD WITHOUT CONTRAST TECHNIQUE: Contiguous axial images were obtained from the base of the skull through the vertex without intravenous contrast. COMPARISON:  None. FINDINGS: BRAIN: 4.3 x 7.6 cm rounded hypodensity with heterogeneous component RIGHT prop occipital lobe effacing the RIGHT atrium. Surrounding apparent vasogenic edema. Local mass-effect, 3 mm RIGHT to LEFT midline shift. No intraparenchymal hemorrhage. No hydrocephalus. No abnormal extra-axial fluid collections. VASCULAR: Moderate calcific atherosclerosis of the carotid siphons. SKULL: No skull fracture. No significant scalp soft tissue swelling. SINUSES/ORBITS: Trace paranasal sinus mucosal thickening. Mastoid air cells are well aerated.  The included ocular globes and orbital contents are non-suspicious. Status post bilateral ocular lens implants. OTHER: None. IMPRESSION: 1.  Large RIGHT parieto-occipital masslike hypodensity, differential diagnosis includes tumor/metastasis, cerebritis or less likely subacute MCA/ posterior watershed territory infarct. 3 mm RIGHT to LEFT midline shift. Recommend MRI of the brain with and without contrast. Acute findings discussed with and reconfirmed by [facility] NANAVATI on 09/18/2016 at 10:20 pm. Electronically Signed   By: Elon Alas M.D.   On: 09/18/2016 22:23      Assessment/Plan  Right brain mass with vasogenic edema and midline shift:  Neurosurgery advised admission by hospitalist,  to evaluate pt in am. No recommendations were given for medical management of likely increased ICP.  Started on decadron IV 4mg  Q6H per oncology recds per ED.  Repeat labs in am including sodium MRI brain w/wo contrast  Neuro check Q4H  HTN: continue home medicatoins  Hold aspirin for tonight.   Workup for right brain mass:  CT chest/abdomen/pelvis w/wo contrast  Oncology consult   Headache/vomiting: likely due to above.  Will check liver enzymes, lipase zofran prn N/V Continue IVF  Pt may eat now but will keep NPO after MN awaiting neurosurg eval.  Input & Output: NA Lines & Tubes: PIV DVT prophylaxis: SCDs for tonight GI prophylaxis: PPI Consultants: Neurosurgery , oncology Code Status: full  Family Communication: none at bedside Disposition Plan: TBD    Gennaro Africa M.D [facility]

## 2016-09-19 NOTE — Progress Notes (Signed)
I was contacted by the emergency department overnight regarding this patient. Her MRI reveals a brain mass with edema. I recommended neurosurgical evaluation.   I see no role for medical oncology at this time unless her CT scan shows discrete malignancy.   If medical oncology consult is needed, please contact the person on-call for an official consult.

## 2016-09-19 NOTE — ED Notes (Signed)
Attempted to call report on patient to the floor, was told the nurse had gone to lunch and will call back. Attempted to page hospitalist pertaining to CT scans and have yet to hear back.

## 2016-09-19 NOTE — ED Notes (Signed)
Paged hospitalist x2. Still awaiting call back.

## 2016-09-19 NOTE — Progress Notes (Signed)
Spoke with pt and daughter about their desire to have durable power of attorney.  Provided education around health care power of attorney and offered support around process of securing durable.  Daughter is going to confer with her attorney for paperwork.     [place], [place]

## 2016-09-19 NOTE — Progress Notes (Signed)
*  PRELIMINARY RESULTS* Vascular Ultrasound Carotid Duplex (Doppler) has been completed.  Findings suggest 1-39% internal carotid artery stenosis bilaterally. Vertebral arteries are patent with antegrade flow.  Right lower extremity venous duplex completed. Right lower extremity is negative for deep vein thrombosis. There is no evidence of right Baker's cyst.  Incidental finding: there is a vascularized hypoechoic homogenous area of the right groin measuring 2.8cm; etiology is unknown.   09/19/2016 4:26 PM  Maudry Mayhew, BS, RVT, RDCS, RDMS

## 2016-09-19 NOTE — Progress Notes (Signed)
Met with patient in her room; reviewed situation with her. She understands we have a Bx pending and also that inguinal nodes tend to be nonspecific; she may need more invasive procedures.  I would favor resection and XRT of the brain lesion. Will discuss with RadOnc  Will follow with you. Full note to follow

## 2016-09-19 NOTE — Progress Notes (Addendum)
PROGRESS NOTE  Deanna Washington  SWF:[id number] DOB: 1940-11-10 DOA: 09/18/2016 PCP: Haywood Pao, MD  Brief Narrative:   The patient is a 76 year old female with history of breast cancer status post lumpectomy and radiation 8 years ago who has been in remission and asthma who presented with progressive headache, nausea and vomiting. CT of the head demonstrated a large right parieto-occipital mass like hypodensity insistent with a tumor or metastases with 3 mm of right to left midline shift.  She was admitted and started on Decadron. MRI confirmed a 6 x 5 x 6.4 cm heterogeneous enhancing mass in the posterior right cerebrum spinning but the parietal and occipital lobes with scattered cystic spaces and blood products. There is local mass effect and mild vasogenic edema. There are also 2 additional masses a 10 mm left cerebellar mass and a 3 mm left parietal cortex mass suggestive of metastatic disease. In addition, she had punctate areas of cortical diffusion hyperintensity in the left occipital lobe consistent with small acute infarcts with a subacute ischemia in the upper right cerebellum.  Dr. Saintclair Halsted from neurosurgery reviewed the CT and MRI and felt that this might represent a high-grade glioma or glioblastoma. He increased the patient's Decadron to 10 mg IV every 6 hours and started Keppra for seizure prophylaxis.  CT of the chest/abdomen and pelvis demonstrates numerous lung nodules and mediastinal/hilar lymphadenopathy suggestive of possible prior lung CA vs. Recurrence of her breast cancer.    Assessment & Plan:   Principal Problem:   Brain tumor [facility]) Active Problems:   Asthma   Essential hypertension   Acute embolic stroke [facility])   Atrial fibrillation, new onset ([place])   Sinus bradycardia   Metastatic cancer ([place])  Right brain mass with vasogenic edema and midline shift with mild left hand dysmetria, headache and nausea and vomiting.  CT of the chest abdomen and pelvis demonstrated  numerous pulmonary nodules, necrotic enlarged mediastinal and hilar lymphadenopathy, a possible adrenal nodule, possible bone metastases, no significant lymphadenopathy in the abdomen but she had some enlarged right inguinal lymph nodes. -  Decadron 10 mg IV every 6 hours -  Keppra 500 mg twice a day -  IR consult did for biopsy of the right inguinal lymph node -  If right inguinal lymph node is nondiagnostic, the next step would be pulmonology for bronch and biopsy -  Oncology consultation:  Dr. Jana Hakim -  XRT Onc:  Dr. Lisbeth Renshaw  -  NSGY:  Dr. Saintclair Halsted  Acute and subacute infarcts, likely due to a-fib (new diagnosis as of 8/23) -  ECHO and carotid duplex  -  A1c and lipid panel -  PT/OT/SLP -  Stroke swallow screen and advance diet as tolerated  New onset a-fib/a-flutter with rates in the 110-120s, asymptomatic.  CHADs2vasc = 5.  Spontaneously converted to NSR while I was in room discussing with patient -  ECHO already ordered -  TSH and troponins -  Discussed with cardiology:  Start amiodarone if persistent or symptomatic -  Do NOT start diltiazem or BB due to baseline sinus bradycardia in the 30s and 40s.   -  No A/C due to possible biopsy  RLE edema -  Duplex RLE  HTN, blood pressures elevated overnight, but improved this morning - Continue hydrochlorothiazide and losartan  Asthma, stable, continue Dulera  Sinus bradycardia, but no evidence of herniation on CT or MRI.  Patient is not on AV nodal blocking medications.  DVT prophylaxis:  Lovenox Code Status:  Full  code Family Communication:  Return to the bedside this afternoon to speak with multiple family members. Disposition Plan:  Awaiting further work up for brain mass and metastatic cancer   Consultants:   Neurosurgery  Oncology  Radiation oncology  Procedures:  CT head MRI brain CT chest/ab/pelvis  Antimicrobials:  Anti-infectives    None       Subjective:  Headache persists but nausea and vomiting have  improved.  Has not noticed any focal numbness, tingling, weakness, or confusion.  She has had approximately 40 pounds of weight loss which she attributed to poor appetite because she has a caretaker for her husband. She denies fevers, night sweats. She is felt negative small bump on the top of her head but has not noticed any other lumps or bumps or adenopathy. She is up-to-date on her mammogram.  Denies any change in her stool frequency or consistency. Denies blood in her stools.  Objective: Vitals:   09/19/16 1000 09/19/16 1200 09/19/16 1300 09/19/16 1400  BP: (!) 118/53 (!) 130/45  135/87  Pulse: (!) 44 (!) 54 (!) 112   Resp: 18 (!) 25 17 15   Temp:  98.2 F (36.8 C)    TempSrc:  Oral    SpO2: 94% 93% 90%   Weight:      Height:        Intake/Output Summary (Last 24 hours) at 09/19/16 1553 Last data filed at 09/19/16 1300  Gross per 24 hour  Intake             1715 ml  Output                0 ml  Net             1715 ml   Filed Weights   09/18/16 1546  Weight: 67.1 kg (148 lb)    Examination:  General exam:  Adult Female.  No acute distress.  HEENT:  NCAT, MMM Respiratory system: Clear to auscultation bilaterally Cardiovascular system: Bradycardic, regular rhythm, normal S1/S2. No murmurs, rubs, gallops or clicks.  Warm extremities Gastrointestinal system: Normal active bowel sounds, soft, nondistended, nontender. MSK:  Normal tone and bulk, 2+ right lower extremity edema, no LLE edema Neuro:  Cranial nerves II through XII grossly intact. Strength 5 out of 5 throughout. Sensation intact to light touch throughout. Mild dysmetria of the left upper and lower extremities.    Data Reviewed: I have personally reviewed following labs and imaging studies  CBC:  Recent Labs Lab 09/18/16 1659 09/19/16 0319  WBC 10.8* 11.1*  HGB 14.9 13.7  HCT 43.3 40.6  MCV 93.5 96.0  PLT 286 676   Basic Metabolic Panel:  Recent Labs Lab 09/18/16 1659 09/19/16 0319  NA 138 138  K  3.6 3.8  CL 103 105  CO2 26 27  GLUCOSE 112* 126*  BUN 12 12  CREATININE 0.74 0.78  CALCIUM 9.6 8.9   GFR: Estimated Creatinine Clearance: 62.2 mL/min (by C-G formula based on SCr of 0.78 mg/dL). Liver Function Tests:  Recent Labs Lab 09/19/16 0319  AST 21  ALT 18  ALKPHOS 61  BILITOT 0.6  PROT 6.3*  ALBUMIN 3.7    Recent Labs Lab 09/19/16 0319  LIPASE 29   No results for input(s): AMMONIA in the last 168 hours. Coagulation Profile: No results for input(s): INR, PROTIME in the last 168 hours. Cardiac Enzymes: No results for input(s): CKTOTAL, CKMB, CKMBINDEX, TROPONINI in the last 168 hours. BNP (last 3 results) No results  for input(s): PROBNP in the last 8760 hours. HbA1C: No results for input(s): HGBA1C in the last 72 hours. CBG:  Recent Labs Lab 09/18/16 1711 09/19/16 0817  GLUCAP 107* 116*   Lipid Profile: No results for input(s): CHOL, HDL, LDLCALC, TRIG, CHOLHDL, LDLDIRECT in the last 72 hours. Thyroid Function Tests: No results for input(s): TSH, T4TOTAL, FREET4, T3FREE, THYROIDAB in the last 72 hours. Anemia Panel: No results for input(s): VITAMINB12, FOLATE, FERRITIN, TIBC, IRON, RETICCTPCT in the last 72 hours. Urine analysis:    Component Value Date/Time   COLORURINE AMBER (A) 09/18/2016 1659   APPEARANCEUR HAZY (A) 09/18/2016 1659   LABSPEC 1.027 09/18/2016 1659   PHURINE 5.0 09/18/2016 1659   GLUCOSEU NEGATIVE 09/18/2016 1659   HGBUR SMALL (A) 09/18/2016 1659   BILIRUBINUR NEGATIVE 09/18/2016 1659   KETONESUR 80 (A) 09/18/2016 1659   PROTEINUR 30 (A) 09/18/2016 1659   NITRITE NEGATIVE 09/18/2016 1659   LEUKOCYTESUR MODERATE (A) 09/18/2016 1659   Sepsis Labs: @LABRCNTIP (procalcitonin:4,lacticidven:4)  ) Recent Results (from the past 240 hour(s))  Rapid strep screen     Status: None   Collection Time: 09/18/16  5:20 PM  Result Value Ref Range Status   Streptococcus, Group A Screen (Direct) NEGATIVE NEGATIVE Final    Comment:  (NOTE) A Rapid Antigen test may result negative if the antigen level in the sample is below the detection level of this test. The FDA has not cleared this test as a stand-alone test therefore the rapid antigen negative result has reflexed to a Group A Strep culture.   Culture, group A strep     Status: None (Preliminary result)   Collection Time: 09/18/16  5:20 PM  Result Value Ref Range Status   Specimen Description THROAT  Final   Special Requests NONE Reflexed from [id number]  Final   Culture   Final    TOO YOUNG TO READ Performed at [facility], [place] [address] [place], [place] [address]    Report Status PENDING  Incomplete  MRSA PCR Screening     Status: None   Collection Time: 09/19/16  2:55 AM  Result Value Ref Range Status   MRSA by PCR NEGATIVE NEGATIVE Final    Comment:        The GeneXpert MRSA Assay (FDA approved for NASAL specimens only), is one component of a comprehensive MRSA colonization surveillance program. It is not intended to diagnose MRSA infection nor to guide or monitor treatment for MRSA infections.       Radiology Studies: Dg Chest 2 View  Result Date: 09/18/2016 CLINICAL DATA:  Generalized body aches, fever, chills, nausea and vomiting for 3 days. EXAM: CHEST  2 VIEW COMPARISON:  None. FINDINGS: There is cardiomegaly and vascular congestion. No consolidative process or pneumothorax. Trace bilateral pleural effusions noted. No acute bony abnormality. IMPRESSION: Cardiomegaly and pulmonary vascular congestion. Trace bilateral pleural effusions. Electronically Signed   By: Inge Rise M.D.   On: 09/18/2016 19:53   Ct Head Wo Contrast  Result Date: 09/18/2016 CLINICAL DATA:  Generalized body aches, fever, chills, nausea and vomiting for 3 days. RIGHT eye and ear pain. EXAM: CT HEAD WITHOUT CONTRAST TECHNIQUE: Contiguous axial images were obtained from the base of the skull through the vertex without intravenous contrast. COMPARISON:  None.  FINDINGS: BRAIN: 4.3 x 7.6 cm rounded hypodensity with heterogeneous component RIGHT prop occipital lobe effacing the RIGHT atrium. Surrounding apparent vasogenic edema. Local mass-effect, 3 mm RIGHT to LEFT midline shift. No intraparenchymal hemorrhage. No  hydrocephalus. No abnormal extra-axial fluid collections. VASCULAR: Moderate calcific atherosclerosis of the carotid siphons. SKULL: No skull fracture. No significant scalp soft tissue swelling. SINUSES/ORBITS: Trace paranasal sinus mucosal thickening. Mastoid air cells are well aerated. The included ocular globes and orbital contents are non-suspicious. Status post bilateral ocular lens implants. OTHER: None. IMPRESSION: 1. Large RIGHT parieto-occipital masslike hypodensity, differential diagnosis includes tumor/metastasis, cerebritis or less likely subacute MCA/ posterior watershed territory infarct. 3 mm RIGHT to LEFT midline shift. Recommend MRI of the brain with and without contrast. Acute findings discussed with and reconfirmed by [facility] NANAVATI on 09/18/2016 at 10:20 pm. Electronically Signed   By: Elon Alas M.D.   On: 09/18/2016 22:23   Ct Chest W Contrast  Result Date: 09/19/2016 CLINICAL DATA:  Remote history of breast cancer with recent brain MRI demonstrating metastatic brain lesions. Restaging CT scans. EXAM: CT CHEST, ABDOMEN, AND PELVIS WITH CONTRAST TECHNIQUE: Multidetector CT imaging of the chest, abdomen and pelvis was performed following the standard protocol during bolus administration of intravenous contrast. CONTRAST:  1106mL ISOVUE-300 IOPAMIDOL (ISOVUE-300) INJECTION 61% COMPARISON:  None. FINDINGS: CT CHEST FINDINGS Cardiovascular: The heart is normal in size. No pericardial effusion. Mild tortuosity and calcification of the thoracic aorta but no aneurysm or dissection. The branch vessels are patent. Scattered coronary artery calcifications are noted. Mediastinum/Nodes: Necrotic appearing mediastinal lymphadenopathy. 3.3 x  2.1 cm nodal mass in the aorticopulmonary window on image number 25. There is also cystic/necrotic subcarinal lymphadenopathy with maximum Acxel Dingee axis diameter of 25 mm on image number 33. Other scattered smaller lymph nodes are noted including a 9 mm right infrahilar lymph node on image number 38. The esophagus is grossly normal. Lungs/Pleura: Diffuse pulmonary metastatic disease with numerous bilateral pulmonary nodules. Index lesion in the right middle lobe on image number 115 measures 12 x 11 mm. Left upper lobe nodule on image number 85 measures 7 x 5 mm. Right lower lobe lesion just above the right hemidiaphragm on image number 124 measures 14.5 x 12.5 mm. Large necrotic lesion in the as ago esophageal recess could be a necrotic metastatic lung lesion or a necrotic lymph node. It measures 4.2 by 3.0 cm. Small bilateral pleural effusions and bibasilar atelectasis. Chest wall/ Musculoskeletal: Surgical clips surrounding a cystic area in the right breast, likely small liquified hematoma related to a prior lumpectomy. No obvious breast masses. No axillary lymphadenopathy. No supraclavicular lymphadenopathy. The thyroid gland appears normal. No definite metastatic bone disease. There is a small sclerotic lesion noted in the T1 vertebral body and also in the T3 vertebral body. I do not however see any other definite lesions to suggest metastatic disease. CT ABDOMEN PELVIS FINDINGS Hepatobiliary: No definite findings for hepatic metastatic disease. Layering high attenuation material in the gallbladder could be sludge or stones. No findings for acute cholecystitis. Pancreas: No mass, inflammation or ductal dilatation. Spleen: Normal size.  No focal lesions. Adrenals/Urinary Tract: The adrenal glands are unremarkable. Somewhat irregular appearing left upper pole cystic lesion measures less than 20 Hounsfield units and is most likely a cyst. No worrisome renal lesions or hydronephrosis. Stomach/Bowel: The stomach,  duodenum, small bowel and colon are grossly normal. No acute inflammatory process, mass lesions or obstructive findings. Colonic diverticulosis without findings for acute diverticulitis. Vascular/Lymphatic: Moderate atherosclerotic calcifications involving a tortuous abdominal aorta but no focal aneurysm or dissection. The branch vessels are patent. The major venous structures are patent. 11.5 mm gastrohepatic ligament lymph node is noted on image number 60. There is also a low-attenuation  retrocrural lymph node measures 11 mm on image number 59. No mesenteric or retroperitoneal mass or lymphadenopathy. Reproductive: The enlarged fibroid uterus. The ovaries appear normal. Other: No pelvic lymphadenopathy. There are enlarged right inguinal lymph nodes. Suspect postoperative changes in the right inguinal area. Musculoskeletal: No obvious metastatic bone disease. IMPRESSION: 1. Pulmonary metastatic disease and fairly extensive necrotic appearing mediastinal and hilar lymphadenopathy. 2. No obvious breast masses and no supraclavicular or axillary adenopathy. 3. No obvious abdominal/pelvic metastatic disease or osseous metastatic disease. A few small upper thoracic sclerotic bone lesions require observation. 4. Enlarged fibroid uterus. 5. Enlarged right inguinal lymph nodes and possible prior postoperative changes. Electronically Signed   By: Marijo Sanes M.D.   On: 09/19/2016 14:45   Mr Jeri Cos MV Contrast  Result Date: 09/19/2016 CLINICAL DATA:  Abnormal head CT. Headaches. History breast cancer. EXAM: MRI HEAD WITHOUT AND WITH CONTRAST TECHNIQUE: Multiplanar, multiecho pulse sequences of the brain and surrounding structures were obtained without and with intravenous contrast. CONTRAST:  68mL MULTIHANCE GADOBENATE DIMEGLUMINE 529 MG/ML IV SOLN COMPARISON:  Head CT from yesterday FINDINGS: Brain: Large heterogeneously enhancing mass in the posterior right cerebrum, spanning the parietal and occipital lobes and  measuring up to 6 x 5 x 6.4 cm. This mass contains scattered cystic spaces and probable blood products. The overlying cortex is not visible and there is a thin line of overlying dural thickening. There is local mass effect, mild for the size of tumor. Mild vasogenic edema. 10 mm left cerebellar and 3 mm left parietal cortex enhancing masses, overall pattern suggesting metastatic disease. Punctate areas of cortical diffusion hyperintensity in the left occipital pole consistent with small infarcts. Mild restricted diffusion seen in the right cerebellum, suggesting subacute ischemia. Diffusion within the right-sided mass from blood products or other paramagnetic substance. There is scattered FLAIR hyperintensities in the cerebral white matter consistent with chronic small vessel ischemia. Vascular: Major flow voids are preserved. Skull and upper cervical spine: No aggressive marrow lesion. Right parietal bone lesion has intrinsic T1 hyperintensity most consistent with hemangioma. Sinuses/Orbits: Bilateral cataract resection. Mild chronic sinusitis. IMPRESSION: 1. Metastatic pattern with 3 brain masses. The largest is in the posterior right cerebrum measuring up to 6.4 cm; local mass effect. The other lesions measure 1 cm in the left cerebellum and 3 mm in the left parietal cortex. 2. 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 Subacute ischemia in the upper right cerebellum. Electronically Signed   By: Monte Fantasia M.D.   On: 09/19/2016 07:49   Ct Abdomen Pelvis W Contrast  Result Date: 09/19/2016 CLINICAL DATA:  Remote history of breast cancer with recent brain MRI demonstrating metastatic brain lesions. Restaging CT scans. EXAM: CT CHEST, ABDOMEN, AND PELVIS WITH CONTRAST TECHNIQUE: Multidetector CT imaging of the chest, abdomen and pelvis was performed following the standard protocol during bolus administration of intravenous contrast. CONTRAST:  116mL ISOVUE-300 IOPAMIDOL (ISOVUE-300)  INJECTION 61% COMPARISON:  None. FINDINGS: CT CHEST FINDINGS Cardiovascular: The heart is normal in size. No pericardial effusion. Mild tortuosity and calcification of the thoracic aorta but no aneurysm or dissection. The branch vessels are patent. Scattered coronary artery calcifications are noted. Mediastinum/Nodes: Necrotic appearing mediastinal lymphadenopathy. 3.3 x 2.1 cm nodal mass in the aorticopulmonary window on image number 25. There is also cystic/necrotic subcarinal lymphadenopathy with maximum Stavroula Rohde axis diameter of 25 mm on image number 33. Other scattered smaller lymph nodes are noted including a 9 mm right infrahilar lymph node on image number 38.  The esophagus is grossly normal. Lungs/Pleura: Diffuse pulmonary metastatic disease with numerous bilateral pulmonary nodules. Index lesion in the right middle lobe on image number 115 measures 12 x 11 mm. Left upper lobe nodule on image number 85 measures 7 x 5 mm. Right lower lobe lesion just above the right hemidiaphragm on image number 124 measures 14.5 x 12.5 mm. Large necrotic lesion in the as ago esophageal recess could be a necrotic metastatic lung lesion or a necrotic lymph node. It measures 4.2 by 3.0 cm. Small bilateral pleural effusions and bibasilar atelectasis. Chest wall/ Musculoskeletal: Surgical clips surrounding a cystic area in the right breast, likely small liquified hematoma related to a prior lumpectomy. No obvious breast masses. No axillary lymphadenopathy. No supraclavicular lymphadenopathy. The thyroid gland appears normal. No definite metastatic bone disease. There is a small sclerotic lesion noted in the T1 vertebral body and also in the T3 vertebral body. I do not however see any other definite lesions to suggest metastatic disease. CT ABDOMEN PELVIS FINDINGS Hepatobiliary: No definite findings for hepatic metastatic disease. Layering high attenuation material in the gallbladder could be sludge or stones. No findings for acute  cholecystitis. Pancreas: No mass, inflammation or ductal dilatation. Spleen: Normal size.  No focal lesions. Adrenals/Urinary Tract: The adrenal glands are unremarkable. Somewhat irregular appearing left upper pole cystic lesion measures less than 20 Hounsfield units and is most likely a cyst. No worrisome renal lesions or hydronephrosis. Stomach/Bowel: The stomach, duodenum, small bowel and colon are grossly normal. No acute inflammatory process, mass lesions or obstructive findings. Colonic diverticulosis without findings for acute diverticulitis. Vascular/Lymphatic: Moderate atherosclerotic calcifications involving a tortuous abdominal aorta but no focal aneurysm or dissection. The branch vessels are patent. The major venous structures are patent. 11.5 mm gastrohepatic ligament lymph node is noted on image number 60. There is also a low-attenuation retrocrural lymph node measures 11 mm on image number 59. No mesenteric or retroperitoneal mass or lymphadenopathy. Reproductive: The enlarged fibroid uterus. The ovaries appear normal. Other: No pelvic lymphadenopathy. There are enlarged right inguinal lymph nodes. Suspect postoperative changes in the right inguinal area. Musculoskeletal: No obvious metastatic bone disease. IMPRESSION: 1. Pulmonary metastatic disease and fairly extensive necrotic appearing mediastinal and hilar lymphadenopathy. 2. No obvious breast masses and no supraclavicular or axillary adenopathy. 3. No obvious abdominal/pelvic metastatic disease or osseous metastatic disease. A few small upper thoracic sclerotic bone lesions require observation. 4. Enlarged fibroid uterus. 5. Enlarged right inguinal lymph nodes and possible prior postoperative changes. Electronically Signed   By: Marijo Sanes M.D.   On: 09/19/2016 14:45     Scheduled Meds: . aspirin  300 mg Rectal Daily   Or  . aspirin  325 mg Oral Daily  . dexamethasone  10 mg Intravenous Q6H  . enoxaparin (LOVENOX) injection  40 mg  Subcutaneous Q24H  . losartan  50 mg Oral Daily   And  . hydrochlorothiazide  12.5 mg Oral Daily  . levETIRAcetam  500 mg Oral BID  . mometasone-formoterol  2 puff Inhalation BID  . pantoprazole (PROTONIX) IV  40 mg Intravenous QHS   Continuous Infusions: . sodium chloride 75 mL/hr at 09/19/16 1300     LOS: 0 days    Time spent: 30 min    Janece Canterbury, MD [facility] Pager [phone]  If 7PM-7AM, please contact night-coverage [email or web address] Password [facility] 09/19/2016, 3:53 PM

## 2016-09-19 NOTE — Progress Notes (Signed)
Pt HR currently 38-40 bpm. RN contacted on-call for [facility] and discussed administration of Pacerone at 2200. On-call provider recommended holding 2200 dose of Pacerone tonight, as long as pt doesn't appear to be converting to A-fib/A-flutter, and have medication readdress by attending cardiology MD tomorrow. Will continue to monitor.

## 2016-09-19 NOTE — Care Management Note (Signed)
Case Management Note  Patient Details  Name: Sumiya Mamaril MRN: [id number] Date of Birth: 1940/07/23  Subjective/Objective:      Falls-ct scan of head shows brain swelling and mass              Action/Plan: Date:  September 19, 2016  Chart reviewed for concurrent status and case management needs.  Will continue to follow patient progress.  Discharge Planning: following for needs  Expected discharge date: [id number]  Velva Harman, BSN, [place], [place]   Expected Discharge Date:                  Expected Discharge Plan:  Home/Self Care  In-House Referral:     Discharge planning Services  CM Consult  Post Acute Care Choice:    Choice offered to:     DME Arranged:    DME Agency:     HH Arranged:    HH Agency:     Status of Service:  In process, will continue to follow  If discussed at Long Length of Stay Meetings, dates discussed:    Additional Comments:  Leeroy Cha, RN 09/19/2016, 9:09 AM

## 2016-09-19 NOTE — Consult Note (Signed)
Chief Complaint: Patient was seen in consultation today for lymphadenopathy  Referring Physician(s):  Dr. Janece Canterbury  Supervising Physician: Markus Daft  Patient Status: [facility] - Out-pt  History of Present Illness: Deanna Washington is a 76 y.o. female with past medical history of breast cancer s/p lumpectomy and radiation 8 years ago who presented to ED today with persistent headache, nausea, and vomiting for the past 3-4 days.   MR Head shows: 1. Metastatic pattern with 3 brain masses. The largest is in the posterior right cerebrum measuring up to 6.4 cm; local mass effect. The other lesions measure 1 cm in the left cerebellum and 3 mm in the left parietal cortex. 2. 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 Subacute ischemia in the upper right cerebellum.  CT Abd/Pelvis shows: 1. Pulmonary metastatic disease and fairly extensive necrotic appearing mediastinal and hilar lymphadenopathy. 2. No obvious breast masses and no supraclavicular or axillary adenopathy. 3. No obvious abdominal/pelvic metastatic disease or osseous metastatic disease. A few small upper thoracic sclerotic bone lesions require observation. 4. Enlarged fibroid uterus. 5. Enlarged right inguinal lymph nodes and possible prior postoperative changes.  IR consulted for inguinal lymph node biopsy.   Past Medical History:  Diagnosis Date  . Asthma     History reviewed. No pertinent surgical history.  Allergies: Patient has no known allergies.  Medications: Prior to Admission medications   Medication Sig Start Date End Date Taking? Authorizing Provider  ADVAIR DISKUS 500-50 MCG/DOSE AEPB Place 1 puff into alternate nostrils 2 (two) times daily. 08/23/16  Yes [provider]  aspirin 325 MG tablet Take 325 mg by mouth once.   Yes [provider]  diphenhydrAMINE (BENADRYL) 25 mg capsule Take 25 mg by mouth every 6 (six) hours as needed for sleep.   Yes [provider]  losartan-hydrochlorothiazide (HYZAAR) 50-12.5 MG tablet Take 1 tablet by mouth daily. 07/06/16  Yes [provider]  PROAIR HFA 108 (90 Base) MCG/ACT inhaler Place 1-2 puffs into alternate nostrils every 4 (four) hours as needed for wheezing. 08/01/16  Yes [provider]     No family history on file.  Social History   Social History  . Marital status: Married    Spouse name: N/A  . Number of children: N/A  . Years of education: N/A   Social History Main Topics  . Smoking status: Never Smoker  . Smokeless tobacco: Never Used  . Alcohol use None  . Drug use: Unknown  . Sexual activity: Not Asked   Other Topics Concern  . None   Social History Narrative  . None     Review of Systems  Constitutional: Negative for fatigue and fever.  Respiratory: Negative for cough and shortness of breath.   Cardiovascular: Negative for chest pain.  Gastrointestinal: Positive for nausea and vomiting.  Neurological: Positive for dizziness.  Psychiatric/Behavioral: Negative for behavioral problems and confusion.    Vital Signs: BP 135/87   Pulse (!) 112   Temp 98.3 F (36.8 C) (Oral)   Resp 15   Ht 5\' 8"  (1.727 m)   Wt 148 lb (67.1 kg)   SpO2 95%   BMI 22.50 kg/m   Physical Exam  Constitutional: She is oriented to person, place, and time. She appears well-developed.  Cardiovascular: Normal rate, regular rhythm and normal heart sounds.   Pulmonary/Chest: Effort normal and breath sounds normal. No respiratory distress.  Neurological: She is alert and oriented to person, place, and time.  Skin: Skin is warm and dry.  Psychiatric: She has a normal mood and affect. Her behavior is normal. Judgment and thought content normal.  Nursing note and vitals reviewed.   Mallampati Score:  MD Evaluation Airway: WNL Heart: WNL Abdomen: WNL Chest/ Lungs: WNL ASA  Classification: 3 Mallampati/Airway Score: Two  Imaging: Dg Chest 2 View  Result Date:  09/18/2016 CLINICAL DATA:  Generalized body aches, fever, chills, nausea and vomiting for 3 days. EXAM: CHEST  2 VIEW COMPARISON:  None. FINDINGS: There is cardiomegaly and vascular congestion. No consolidative process or pneumothorax. Trace bilateral pleural effusions noted. No acute bony abnormality. IMPRESSION: Cardiomegaly and pulmonary vascular congestion. Trace bilateral pleural effusions. Electronically Signed   By: Inge Rise M.D.   On: 09/18/2016 19:53   Ct Head Wo Contrast  Result Date: 09/18/2016 CLINICAL DATA:  Generalized body aches, fever, chills, nausea and vomiting for 3 days. RIGHT eye and ear pain. EXAM: CT HEAD WITHOUT CONTRAST TECHNIQUE: Contiguous axial images were obtained from the base of the skull through the vertex without intravenous contrast. COMPARISON:  None. FINDINGS: BRAIN: 4.3 x 7.6 cm rounded hypodensity with heterogeneous component RIGHT prop occipital lobe effacing the RIGHT atrium. Surrounding apparent vasogenic edema. Local mass-effect, 3 mm RIGHT to LEFT midline shift. No intraparenchymal hemorrhage. No hydrocephalus. No abnormal extra-axial fluid collections. VASCULAR: Moderate calcific atherosclerosis of the carotid siphons. SKULL: No skull fracture. No significant scalp soft tissue swelling. SINUSES/ORBITS: Trace paranasal sinus mucosal thickening. Mastoid air cells are well aerated. The included ocular globes and orbital contents are non-suspicious. Status post bilateral ocular lens implants. OTHER: None. IMPRESSION: 1. Large RIGHT parieto-occipital masslike hypodensity, differential diagnosis includes tumor/metastasis, cerebritis or less likely subacute MCA/ posterior watershed territory infarct. 3 mm RIGHT to LEFT midline shift. Recommend MRI of the brain with and without contrast. Acute findings discussed with and reconfirmed by [facility] NANAVATI on 09/18/2016 at 10:20 pm. Electronically Signed   By: Elon Alas M.D.   On: 09/18/2016 22:23   Ct Chest W  Contrast  Result Date: 09/19/2016 CLINICAL DATA:  Remote history of breast cancer with recent brain MRI demonstrating metastatic brain lesions. Restaging CT scans. EXAM: CT CHEST, ABDOMEN, AND PELVIS WITH CONTRAST TECHNIQUE: Multidetector CT imaging of the chest, abdomen and pelvis was performed following the standard protocol during bolus administration of intravenous contrast. CONTRAST:  178mL ISOVUE-300 IOPAMIDOL (ISOVUE-300) INJECTION 61% COMPARISON:  None. FINDINGS: CT CHEST FINDINGS Cardiovascular: The heart is normal in size. No pericardial effusion. Mild tortuosity and calcification of the thoracic aorta but no aneurysm or dissection. The branch vessels are patent. Scattered coronary artery calcifications are noted. Mediastinum/Nodes: Necrotic appearing mediastinal lymphadenopathy. 3.3 x 2.1 cm nodal mass in the aorticopulmonary window on image number 25. There is also cystic/necrotic subcarinal lymphadenopathy with maximum short axis diameter of 25 mm on image number 33. Other scattered smaller lymph nodes are noted including a 9 mm right infrahilar lymph node on image number 38. The esophagus is grossly normal. Lungs/Pleura: Diffuse pulmonary metastatic disease with numerous bilateral pulmonary nodules. Index lesion in the right middle lobe on image number 115 measures 12 x 11 mm. Left upper lobe nodule on image number 85 measures 7 x 5 mm. Right lower lobe lesion just above the right hemidiaphragm on image number 124 measures 14.5 x 12.5 mm. Large necrotic lesion in the as ago esophageal recess could be a necrotic metastatic lung lesion or a necrotic lymph node. It measures 4.2 by 3.0 cm. Small bilateral pleural effusions and bibasilar  atelectasis. Chest wall/ Musculoskeletal: Surgical clips surrounding a cystic area in the right breast, likely small liquified hematoma related to a prior lumpectomy. No obvious breast masses. No axillary lymphadenopathy. No supraclavicular lymphadenopathy. The thyroid  gland appears normal. No definite metastatic bone disease. There is a small sclerotic lesion noted in the T1 vertebral body and also in the T3 vertebral body. I do not however see any other definite lesions to suggest metastatic disease. CT ABDOMEN PELVIS FINDINGS Hepatobiliary: No definite findings for hepatic metastatic disease. Layering high attenuation material in the gallbladder could be sludge or stones. No findings for acute cholecystitis. Pancreas: No mass, inflammation or ductal dilatation. Spleen: Normal size.  No focal lesions. Adrenals/Urinary Tract: The adrenal glands are unremarkable. Somewhat irregular appearing left upper pole cystic lesion measures less than 20 Hounsfield units and is most likely a cyst. No worrisome renal lesions or hydronephrosis. Stomach/Bowel: The stomach, duodenum, small bowel and colon are grossly normal. No acute inflammatory process, mass lesions or obstructive findings. Colonic diverticulosis without findings for acute diverticulitis. Vascular/Lymphatic: Moderate atherosclerotic calcifications involving a tortuous abdominal aorta but no focal aneurysm or dissection. The branch vessels are patent. The major venous structures are patent. 11.5 mm gastrohepatic ligament lymph node is noted on image number 60. There is also a low-attenuation retrocrural lymph node measures 11 mm on image number 59. No mesenteric or retroperitoneal mass or lymphadenopathy. Reproductive: The enlarged fibroid uterus. The ovaries appear normal. Other: No pelvic lymphadenopathy. There are enlarged right inguinal lymph nodes. Suspect postoperative changes in the right inguinal area. Musculoskeletal: No obvious metastatic bone disease. IMPRESSION: 1. Pulmonary metastatic disease and fairly extensive necrotic appearing mediastinal and hilar lymphadenopathy. 2. No obvious breast masses and no supraclavicular or axillary adenopathy. 3. No obvious abdominal/pelvic metastatic disease or osseous metastatic  disease. A few small upper thoracic sclerotic bone lesions require observation. 4. Enlarged fibroid uterus. 5. Enlarged right inguinal lymph nodes and possible prior postoperative changes. Electronically Signed   By: Marijo Sanes M.D.   On: 09/19/2016 14:45   Mr Jeri Cos WR Contrast  Result Date: 09/19/2016 CLINICAL DATA:  Abnormal head CT. Headaches. History breast cancer. EXAM: MRI HEAD WITHOUT AND WITH CONTRAST TECHNIQUE: Multiplanar, multiecho pulse sequences of the brain and surrounding structures were obtained without and with intravenous contrast. CONTRAST:  14mL MULTIHANCE GADOBENATE DIMEGLUMINE 529 MG/ML IV SOLN COMPARISON:  Head CT from yesterday FINDINGS: Brain: Large heterogeneously enhancing mass in the posterior right cerebrum, spanning the parietal and occipital lobes and measuring up to 6 x 5 x 6.4 cm. This mass contains scattered cystic spaces and probable blood products. The overlying cortex is not visible and there is a thin line of overlying dural thickening. There is local mass effect, mild for the size of tumor. Mild vasogenic edema. 10 mm left cerebellar and 3 mm left parietal cortex enhancing masses, overall pattern suggesting metastatic disease. Punctate areas of cortical diffusion hyperintensity in the left occipital pole consistent with small infarcts. Mild restricted diffusion seen in the right cerebellum, suggesting subacute ischemia. Diffusion within the right-sided mass from blood products or other paramagnetic substance. There is scattered FLAIR hyperintensities in the cerebral white matter consistent with chronic small vessel ischemia. Vascular: Major flow voids are preserved. Skull and upper cervical spine: No aggressive marrow lesion. Right parietal bone lesion has intrinsic T1 hyperintensity most consistent with hemangioma. Sinuses/Orbits: Bilateral cataract resection. Mild chronic sinusitis. IMPRESSION: 1. Metastatic pattern with 3 brain masses. The largest is in the  posterior right cerebrum measuring  up to 6.4 cm; local mass effect. The other lesions measure 1 cm in the left cerebellum and 3 mm in the left parietal cortex. 2. 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 Subacute ischemia in the upper right cerebellum. Electronically Signed   By: Monte Fantasia M.D.   On: 09/19/2016 07:49   Ct Abdomen Pelvis W Contrast  Result Date: 09/19/2016 CLINICAL DATA:  Remote history of breast cancer with recent brain MRI demonstrating metastatic brain lesions. Restaging CT scans. EXAM: CT CHEST, ABDOMEN, AND PELVIS WITH CONTRAST TECHNIQUE: Multidetector CT imaging of the chest, abdomen and pelvis was performed following the standard protocol during bolus administration of intravenous contrast. CONTRAST:  117mL ISOVUE-300 IOPAMIDOL (ISOVUE-300) INJECTION 61% COMPARISON:  None. FINDINGS: CT CHEST FINDINGS Cardiovascular: The heart is normal in size. No pericardial effusion. Mild tortuosity and calcification of the thoracic aorta but no aneurysm or dissection. The branch vessels are patent. Scattered coronary artery calcifications are noted. Mediastinum/Nodes: Necrotic appearing mediastinal lymphadenopathy. 3.3 x 2.1 cm nodal mass in the aorticopulmonary window on image number 25. There is also cystic/necrotic subcarinal lymphadenopathy with maximum short axis diameter of 25 mm on image number 33. Other scattered smaller lymph nodes are noted including a 9 mm right infrahilar lymph node on image number 38. The esophagus is grossly normal. Lungs/Pleura: Diffuse pulmonary metastatic disease with numerous bilateral pulmonary nodules. Index lesion in the right middle lobe on image number 115 measures 12 x 11 mm. Left upper lobe nodule on image number 85 measures 7 x 5 mm. Right lower lobe lesion just above the right hemidiaphragm on image number 124 measures 14.5 x 12.5 mm. Large necrotic lesion in the as ago esophageal recess could be a necrotic metastatic lung lesion or a  necrotic lymph node. It measures 4.2 by 3.0 cm. Small bilateral pleural effusions and bibasilar atelectasis. Chest wall/ Musculoskeletal: Surgical clips surrounding a cystic area in the right breast, likely small liquified hematoma related to a prior lumpectomy. No obvious breast masses. No axillary lymphadenopathy. No supraclavicular lymphadenopathy. The thyroid gland appears normal. No definite metastatic bone disease. There is a small sclerotic lesion noted in the T1 vertebral body and also in the T3 vertebral body. I do not however see any other definite lesions to suggest metastatic disease. CT ABDOMEN PELVIS FINDINGS Hepatobiliary: No definite findings for hepatic metastatic disease. Layering high attenuation material in the gallbladder could be sludge or stones. No findings for acute cholecystitis. Pancreas: No mass, inflammation or ductal dilatation. Spleen: Normal size.  No focal lesions. Adrenals/Urinary Tract: The adrenal glands are unremarkable. Somewhat irregular appearing left upper pole cystic lesion measures less than 20 Hounsfield units and is most likely a cyst. No worrisome renal lesions or hydronephrosis. Stomach/Bowel: The stomach, duodenum, small bowel and colon are grossly normal. No acute inflammatory process, mass lesions or obstructive findings. Colonic diverticulosis without findings for acute diverticulitis. Vascular/Lymphatic: Moderate atherosclerotic calcifications involving a tortuous abdominal aorta but no focal aneurysm or dissection. The branch vessels are patent. The major venous structures are patent. 11.5 mm gastrohepatic ligament lymph node is noted on image number 60. There is also a low-attenuation retrocrural lymph node measures 11 mm on image number 59. No mesenteric or retroperitoneal mass or lymphadenopathy. Reproductive: The enlarged fibroid uterus. The ovaries appear normal. Other: No pelvic lymphadenopathy. There are enlarged right inguinal lymph nodes. Suspect  postoperative changes in the right inguinal area. Musculoskeletal: No obvious metastatic bone disease. IMPRESSION: 1. Pulmonary metastatic disease and fairly extensive necrotic  appearing mediastinal and hilar lymphadenopathy. 2. No obvious breast masses and no supraclavicular or axillary adenopathy. 3. No obvious abdominal/pelvic metastatic disease or osseous metastatic disease. A few small upper thoracic sclerotic bone lesions require observation. 4. Enlarged fibroid uterus. 5. Enlarged right inguinal lymph nodes and possible prior postoperative changes. Electronically Signed   By: Marijo Sanes M.D.   On: 09/19/2016 14:45    Labs:  CBC:  Recent Labs  09/18/16 1659 09/19/16 0319  WBC 10.8* 11.1*  HGB 14.9 13.7  HCT 43.3 40.6  PLT 286 261    COAGS: No results for input(s): INR, APTT in the last 8760 hours.  BMP:  Recent Labs  09/18/16 1659 09/19/16 0319  NA 138 138  K 3.6 3.8  CL 103 105  CO2 26 27  GLUCOSE 112* 126*  BUN 12 12  CALCIUM 9.6 8.9  CREATININE 0.74 0.78  GFRNONAA >60 >60  GFRAA >60 >60    LIVER FUNCTION TESTS:  Recent Labs  09/19/16 0319  BILITOT 0.6  AST 21  ALT 18  ALKPHOS 61  PROT 6.3*  ALBUMIN 3.7    TUMOR MARKERS: No results for input(s): AFPTM, CEA, CA199, CHROMGRNA in the last 8760 hours.  Assessment and Plan: Patient with past medical history of breast cancer 8 years ago with remission presents with complaint of nausea, vomiting, and headache.  Imaging shows large brain mass as well as other evidence of metastatic disease.    IR consulted for right inguinal lymph node biopsy at the request of Dr. Janece Canterbury. Case reviewed by Dr. Anselm Pancoast who approves patient for procedure.  Anticipate procedure in IR tomorrow as schedule allows.  Patient will be made NPO after midnight.  Blood thinners delay until tomorrow evening.  Risks and benefits discussed with the patient including, but not limited to bleeding, infection, damage to adjacent  structures or low yield requiring additional tests. All of the patient's questions were answered, patient is agreeable to proceed. Consent signed and in chart.   Thank you for this interesting consult.  I greatly enjoyed meeting Deanna Washington and look forward to participating in their care.  A copy of this report was sent to the requesting provider on this date.  Electronically Signed: Docia Barrier, PA 09/19/2016, 5:09 PM   I spent a total of 40 Minutes    in face to face in clinical consultation, greater than 50% of which was counseling/coordinating care for lymphadenopathy

## 2016-09-19 NOTE — Progress Notes (Signed)
Patient ID: Deanna Washington, female   DOB: 04-Oct-1940, 76 y.o.   MRN: [id number] Results of CTs noted...received word from Rad/Onc that plan for treatment is pending results of node biopsy. It appears patient has widely metastatic disease.  Large right occipital lesion is amenable to resection but is moderately high risk with its extension into the ventricle.  I will await word from Rad/Onc and Med/Onc at conference as to whether we are going to entertain any neurosurgery.

## 2016-09-19 NOTE — Consult Note (Signed)
Cardiology Consultation:   Patient ID: Deanna Washington; [id number]; 02-12-1940   Admit date: 09/18/2016 Date of Consult: 09/19/2016  Primary Care Provider: Haywood Pao, MD Primary Cardiologist: Dr. Oval Linsey (new)   Patient Profile:   Deanna Washington is a 76 y.o. female with a hx of with breast cancer s/p lumpectomy and XRT 8 years ago and asthma  who is being seen today for the evaluation of tachy/brady syndrome at the request of Dr. Sheran Fava.  History of Present Illness:   [name] presented to the ED with 3 days of nausea and emesis.  She denied diarrhea, fever, chills or abdominal pain.  She also reported dizziness and had a head CT that showed a large R parieto-occipital mass-like hypodensity with 86mm R-->L midline shift.  She was admitted to the Hospitalist service and started on IV dexamethasone.  She subsequently had an MRI of the brain that revealed 3 brain masses concerning for metastatic disease.  She also had a cluster of small, acute infarcts in the L occipital pole with subacute ischemia in the upper right cerebellum.  While in the hospital she was noted to be bradycardic with heart rates in the 30s-40s while awake.  She denies any lightheadedness, dizziness or palpitations.  On 8/23 she went into atrial fibrillation and atrial flutter with ventricular rates in the 120s.  She was completely asymptomatic.  She spontaneously converted to sinus rhythm.  Cardiology was consulted for rhythm management.  Prior to hospitalization [name] was doing well.  She denies any history of chest pain or shortness of breath.  She hasn't noted any lower extremity edema, orthopnea, PND or palpitations.  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  Her daughter has a pacemaker.   Past Medical History:  Diagnosis Date  . Asthma     History reviewed. No pertinent surgical history.   Home Medications:  Prior to Admission medications   Medication Sig Start Date End Date  Taking? Authorizing Provider  ADVAIR DISKUS 500-50 MCG/DOSE AEPB Place 1 puff into alternate nostrils 2 (two) times daily. 08/23/16  Yes [provider]  aspirin 325 MG tablet Take 325 mg by mouth once.   Yes [provider]  diphenhydrAMINE (BENADRYL) 25 mg capsule Take 25 mg by mouth every 6 (six) hours as needed for sleep.   Yes [provider]  losartan-hydrochlorothiazide (HYZAAR) 50-12.5 MG tablet Take 1 tablet by mouth daily. 07/06/16  Yes [provider]  PROAIR HFA 108 (90 Base) MCG/ACT inhaler Place 1-2 puffs into alternate nostrils every 4 (four) hours as needed for wheezing. 08/01/16  Yes [provider]    Inpatient Medications: Scheduled Meds: . amiodarone  150 mg Intravenous Once  . aspirin  300 mg Rectal Daily   Or  . aspirin  325 mg Oral Daily  . dexamethasone  10 mg Intravenous Q6H  . enoxaparin (LOVENOX) injection  40 mg Subcutaneous Q24H  . losartan  50 mg Oral Daily   And  . hydrochlorothiazide  12.5 mg Oral Daily  . levETIRAcetam  500 mg Oral BID  . mometasone-formoterol  2 puff Inhalation BID  . pantoprazole (PROTONIX) IV  40 mg Intravenous QHS   Continuous Infusions: . sodium chloride 75 mL/hr at 09/19/16 1300  . amiodarone     Followed by  . amiodarone     PRN Meds: acetaminophen, albuterol, diphenhydrAMINE, iopamidol, morphine injection, ondansetron (ZOFRAN) IV  Allergies:   No Known Allergies  Social History:   Social  History   Social History  . Marital status: Married    Spouse name: N/A  . Number of children: N/A  . Years of education: N/A   Occupational History  . Not on file.   Social History Main Topics  . Smoking status: Never Smoker  . Smokeless tobacco: Never Used  . Alcohol use Not on file  . Drug use: Unknown  . Sexual activity: Not on file   Other Topics Concern  . Not on file   Social History Narrative  . No narrative on file    Family History:   Daugher- pacemaker No history of  CAD, stroke or diabetes.  ROS:  Please see the history of present illness.  ROS  A 12 point review of systems was obtained and was negative with pertinent positives noted in the HPI.     Physical Exam/Data:   Vitals:   09/19/16 1000 09/19/16 1200 09/19/16 1300 09/19/16 1400  BP: (!) 118/53 (!) 130/45  135/87  Pulse: (!) 44 (!) 54 (!) 112   Resp: 18 (!) 25 17 15   Temp:  98.2 F (36.8 C)    TempSrc:  Oral    SpO2: 94% 93% 90%   Weight:      Height:        Intake/Output Summary (Last 24 hours) at 09/19/16 1540 Last data filed at 09/19/16 1300  Gross per 24 hour  Intake             1715 ml  Output                0 ml  Net             1715 ml   Filed Weights   09/18/16 1546  Weight: 67.1 kg (148 lb)  VS:  BP 135/87   Pulse (!) 112   Temp 98.3 F (36.8 C) (Oral)   Resp 15   Ht 5\' 8"  (1.727 m)   Wt 67.1 kg (148 lb)   SpO2 95%   BMI 22.50 kg/m  , BMI Body mass index is 22.5 kg/m. GENERAL:  Well appearing HEENT: Pupils equal round and reactive, fundi not visualized, oral mucosa unremarkable NECK:  No jugular venous distention, waveform within normal limits, carotid upstroke brisk and symmetric, no bruits, no thyromegaly LYMPHATICS:  No cervical adenopathy LUNGS:  Clear to auscultation bilaterally HEART:  RRR.  PMI not displaced or sustained,S1 and S2 within normal limits, no S3, no S4, no clicks, no rubs, no murmurs ABD:  Flat, positive bowel sounds normal in frequency in pitch, no bruits, no rebound, no guarding, no midline pulsatile mass, no hepatomegaly, no splenomegaly EXT:  2 plus pulses throughout, no edema, no cyanosis no clubbing SKIN:  No rashes no nodules NEURO:  Cranial nerves II through XII grossly intact, motor grossly intact throughout PSYCH:  Cognitively intact, oriented to person place and time   EKG:  The EKG was personally reviewed and demonstrates:   09/19/16 @ 16:27: Sinus bradycardia.  Rate 50 bpm.  Short PR.   Telemetry:  Telemetry was personally  reviewed and demonstrates:   Sinus rhythm, sinus bradycardia.  Rate 30s-70s.  Atrial fibrillation and atrial flutter in the 120s.   Relevant CV Studies:  Echo pending  Laboratory Data:  Chemistry Recent Labs Lab 09/18/16 1659 09/19/16 0319  NA 138 138  K 3.6 3.8  CL 103 105  CO2 26 27  GLUCOSE 112* 126*  BUN 12 12  CREATININE 0.74 0.78  CALCIUM  9.6 8.9  GFRNONAA >60 >60  GFRAA >60 >60  ANIONGAP 9 6     Recent Labs Lab 09/19/16 0319  PROT 6.3*  ALBUMIN 3.7  AST 21  ALT 18  ALKPHOS 61  BILITOT 0.6   Hematology Recent Labs Lab 09/18/16 1659 09/19/16 0319  WBC 10.8* 11.1*  RBC 4.63 4.23  HGB 14.9 13.7  HCT 43.3 40.6  MCV 93.5 96.0  MCH 32.2 32.4  MCHC 34.4 33.7  RDW 12.1 12.4  PLT 286 261   Cardiac EnzymesNo results for input(s): TROPONINI in the last 168 hours.  Recent Labs Lab 09/18/16 1727  TROPIPOC 0.00    BNPNo results for input(s): BNP, PROBNP in the last 168 hours.  DDimer No results for input(s): DDIMER in the last 168 hours.  Radiology/Studies:  Dg Chest 2 View  Result Date: 09/18/2016 CLINICAL DATA:  Generalized body aches, fever, chills, nausea and vomiting for 3 days. EXAM: CHEST  2 VIEW COMPARISON:  None. FINDINGS: There is cardiomegaly and vascular congestion. No consolidative process or pneumothorax. Trace bilateral pleural effusions noted. No acute bony abnormality. IMPRESSION: Cardiomegaly and pulmonary vascular congestion. Trace bilateral pleural effusions. Electronically Signed   By: Inge Rise M.D.   On: 09/18/2016 19:53   Ct Head Wo Contrast  Result Date: 09/18/2016 CLINICAL DATA:  Generalized body aches, fever, chills, nausea and vomiting for 3 days. RIGHT eye and ear pain. EXAM: CT HEAD WITHOUT CONTRAST TECHNIQUE: Contiguous axial images were obtained from the base of the skull through the vertex without intravenous contrast. COMPARISON:  None. FINDINGS: BRAIN: 4.3 x 7.6 cm rounded hypodensity with heterogeneous component  RIGHT prop occipital lobe effacing the RIGHT atrium. Surrounding apparent vasogenic edema. Local mass-effect, 3 mm RIGHT to LEFT midline shift. No intraparenchymal hemorrhage. No hydrocephalus. No abnormal extra-axial fluid collections. VASCULAR: Moderate calcific atherosclerosis of the carotid siphons. SKULL: No skull fracture. No significant scalp soft tissue swelling. SINUSES/ORBITS: Trace paranasal sinus mucosal thickening. Mastoid air cells are well aerated. The included ocular globes and orbital contents are non-suspicious. Status post bilateral ocular lens implants. OTHER: None. IMPRESSION: 1. Large RIGHT parieto-occipital masslike hypodensity, differential diagnosis includes tumor/metastasis, cerebritis or less likely subacute MCA/ posterior watershed territory infarct. 3 mm RIGHT to LEFT midline shift. Recommend MRI of the brain with and without contrast. Acute findings discussed with and reconfirmed by [facility] NANAVATI on 09/18/2016 at 10:20 pm. Electronically Signed   By: Elon Alas M.D.   On: 09/18/2016 22:23   Ct Chest W Contrast  Result Date: 09/19/2016 CLINICAL DATA:  Remote history of breast cancer with recent brain MRI demonstrating metastatic brain lesions. Restaging CT scans. EXAM: CT CHEST, ABDOMEN, AND PELVIS WITH CONTRAST TECHNIQUE: Multidetector CT imaging of the chest, abdomen and pelvis was performed following the standard protocol during bolus administration of intravenous contrast. CONTRAST:  168mL ISOVUE-300 IOPAMIDOL (ISOVUE-300) INJECTION 61% COMPARISON:  None. FINDINGS: CT CHEST FINDINGS Cardiovascular: The heart is normal in size. No pericardial effusion. Mild tortuosity and calcification of the thoracic aorta but no aneurysm or dissection. The branch vessels are patent. Scattered coronary artery calcifications are noted. Mediastinum/Nodes: Necrotic appearing mediastinal lymphadenopathy. 3.3 x 2.1 cm nodal mass in the aorticopulmonary window on image number 25. There is  also cystic/necrotic subcarinal lymphadenopathy with maximum short axis diameter of 25 mm on image number 33. Other scattered smaller lymph nodes are noted including a 9 mm right infrahilar lymph node on image number 38. The esophagus is grossly normal. Lungs/Pleura: Diffuse pulmonary metastatic disease with numerous  bilateral pulmonary nodules. Index lesion in the right middle lobe on image number 115 measures 12 x 11 mm. Left upper lobe nodule on image number 85 measures 7 x 5 mm. Right lower lobe lesion just above the right hemidiaphragm on image number 124 measures 14.5 x 12.5 mm. Large necrotic lesion in the as ago esophageal recess could be a necrotic metastatic lung lesion or a necrotic lymph node. It measures 4.2 by 3.0 cm. Small bilateral pleural effusions and bibasilar atelectasis. Chest wall/ Musculoskeletal: Surgical clips surrounding a cystic area in the right breast, likely small liquified hematoma related to a prior lumpectomy. No obvious breast masses. No axillary lymphadenopathy. No supraclavicular lymphadenopathy. The thyroid gland appears normal. No definite metastatic bone disease. There is a small sclerotic lesion noted in the T1 vertebral body and also in the T3 vertebral body. I do not however see any other definite lesions to suggest metastatic disease. CT ABDOMEN PELVIS FINDINGS Hepatobiliary: No definite findings for hepatic metastatic disease. Layering high attenuation material in the gallbladder could be sludge or stones. No findings for acute cholecystitis. Pancreas: No mass, inflammation or ductal dilatation. Spleen: Normal size.  No focal lesions. Adrenals/Urinary Tract: The adrenal glands are unremarkable. Somewhat irregular appearing left upper pole cystic lesion measures less than 20 Hounsfield units and is most likely a cyst. No worrisome renal lesions or hydronephrosis. Stomach/Bowel: The stomach, duodenum, small bowel and colon are grossly normal. No acute inflammatory process,  mass lesions or obstructive findings. Colonic diverticulosis without findings for acute diverticulitis. Vascular/Lymphatic: Moderate atherosclerotic calcifications involving a tortuous abdominal aorta but no focal aneurysm or dissection. The branch vessels are patent. The major venous structures are patent. 11.5 mm gastrohepatic ligament lymph node is noted on image number 60. There is also a low-attenuation retrocrural lymph node measures 11 mm on image number 59. No mesenteric or retroperitoneal mass or lymphadenopathy. Reproductive: The enlarged fibroid uterus. The ovaries appear normal. Other: No pelvic lymphadenopathy. There are enlarged right inguinal lymph nodes. Suspect postoperative changes in the right inguinal area. Musculoskeletal: No obvious metastatic bone disease. IMPRESSION: 1. Pulmonary metastatic disease and fairly extensive necrotic appearing mediastinal and hilar lymphadenopathy. 2. No obvious breast masses and no supraclavicular or axillary adenopathy. 3. No obvious abdominal/pelvic metastatic disease or osseous metastatic disease. A few small upper thoracic sclerotic bone lesions require observation. 4. Enlarged fibroid uterus. 5. Enlarged right inguinal lymph nodes and possible prior postoperative changes. Electronically Signed   By: Marijo Sanes M.D.   On: 09/19/2016 14:45   Mr Jeri Cos JJ Contrast  Result Date: 09/19/2016 CLINICAL DATA:  Abnormal head CT. Headaches. History breast cancer. EXAM: MRI HEAD WITHOUT AND WITH CONTRAST TECHNIQUE: Multiplanar, multiecho pulse sequences of the brain and surrounding structures were obtained without and with intravenous contrast. CONTRAST:  57mL MULTIHANCE GADOBENATE DIMEGLUMINE 529 MG/ML IV SOLN COMPARISON:  Head CT from yesterday FINDINGS: Brain: Large heterogeneously enhancing mass in the posterior right cerebrum, spanning the parietal and occipital lobes and measuring up to 6 x 5 x 6.4 cm. This mass contains scattered cystic spaces and probable  blood products. The overlying cortex is not visible and there is a thin line of overlying dural thickening. There is local mass effect, mild for the size of tumor. Mild vasogenic edema. 10 mm left cerebellar and 3 mm left parietal cortex enhancing masses, overall pattern suggesting metastatic disease. Punctate areas of cortical diffusion hyperintensity in the left occipital pole consistent with small infarcts. Mild restricted diffusion seen in the right cerebellum,  suggesting subacute ischemia. Diffusion within the right-sided mass from blood products or other paramagnetic substance. There is scattered FLAIR hyperintensities in the cerebral white matter consistent with chronic small vessel ischemia. Vascular: Major flow voids are preserved. Skull and upper cervical spine: No aggressive marrow lesion. Right parietal bone lesion has intrinsic T1 hyperintensity most consistent with hemangioma. Sinuses/Orbits: Bilateral cataract resection. Mild chronic sinusitis. IMPRESSION: 1. Metastatic pattern with 3 brain masses. The largest is in the posterior right cerebrum measuring up to 6.4 cm; local mass effect. The other lesions measure 1 cm in the left cerebellum and 3 mm in the left parietal cortex. 2. 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 Subacute ischemia in the upper right cerebellum. Electronically Signed   By: Monte Fantasia M.D.   On: 09/19/2016 07:49   Ct Abdomen Pelvis W Contrast  Result Date: 09/19/2016 CLINICAL DATA:  Remote history of breast cancer with recent brain MRI demonstrating metastatic brain lesions. Restaging CT scans. EXAM: CT CHEST, ABDOMEN, AND PELVIS WITH CONTRAST TECHNIQUE: Multidetector CT imaging of the chest, abdomen and pelvis was performed following the standard protocol during bolus administration of intravenous contrast. CONTRAST:  172mL ISOVUE-300 IOPAMIDOL (ISOVUE-300) INJECTION 61% COMPARISON:  None. FINDINGS: CT CHEST FINDINGS Cardiovascular: The heart is  normal in size. No pericardial effusion. Mild tortuosity and calcification of the thoracic aorta but no aneurysm or dissection. The branch vessels are patent. Scattered coronary artery calcifications are noted. Mediastinum/Nodes: Necrotic appearing mediastinal lymphadenopathy. 3.3 x 2.1 cm nodal mass in the aorticopulmonary window on image number 25. There is also cystic/necrotic subcarinal lymphadenopathy with maximum short axis diameter of 25 mm on image number 33. Other scattered smaller lymph nodes are noted including a 9 mm right infrahilar lymph node on image number 38. The esophagus is grossly normal. Lungs/Pleura: Diffuse pulmonary metastatic disease with numerous bilateral pulmonary nodules. Index lesion in the right middle lobe on image number 115 measures 12 x 11 mm. Left upper lobe nodule on image number 85 measures 7 x 5 mm. Right lower lobe lesion just above the right hemidiaphragm on image number 124 measures 14.5 x 12.5 mm. Large necrotic lesion in the as ago esophageal recess could be a necrotic metastatic lung lesion or a necrotic lymph node. It measures 4.2 by 3.0 cm. Small bilateral pleural effusions and bibasilar atelectasis. Chest wall/ Musculoskeletal: Surgical clips surrounding a cystic area in the right breast, likely small liquified hematoma related to a prior lumpectomy. No obvious breast masses. No axillary lymphadenopathy. No supraclavicular lymphadenopathy. The thyroid gland appears normal. No definite metastatic bone disease. There is a small sclerotic lesion noted in the T1 vertebral body and also in the T3 vertebral body. I do not however see any other definite lesions to suggest metastatic disease. CT ABDOMEN PELVIS FINDINGS Hepatobiliary: No definite findings for hepatic metastatic disease. Layering high attenuation material in the gallbladder could be sludge or stones. No findings for acute cholecystitis. Pancreas: No mass, inflammation or ductal dilatation. Spleen: Normal size.   No focal lesions. Adrenals/Urinary Tract: The adrenal glands are unremarkable. Somewhat irregular appearing left upper pole cystic lesion measures less than 20 Hounsfield units and is most likely a cyst. No worrisome renal lesions or hydronephrosis. Stomach/Bowel: The stomach, duodenum, small bowel and colon are grossly normal. No acute inflammatory process, mass lesions or obstructive findings. Colonic diverticulosis without findings for acute diverticulitis. Vascular/Lymphatic: Moderate atherosclerotic calcifications involving a tortuous abdominal aorta but no focal aneurysm or dissection. The branch vessels are patent. The  major venous structures are patent. 11.5 mm gastrohepatic ligament lymph node is noted on image number 60. There is also a low-attenuation retrocrural lymph node measures 11 mm on image number 59. No mesenteric or retroperitoneal mass or lymphadenopathy. Reproductive: The enlarged fibroid uterus. The ovaries appear normal. Other: No pelvic lymphadenopathy. There are enlarged right inguinal lymph nodes. Suspect postoperative changes in the right inguinal area. Musculoskeletal: No obvious metastatic bone disease. IMPRESSION: 1. Pulmonary metastatic disease and fairly extensive necrotic appearing mediastinal and hilar lymphadenopathy. 2. No obvious breast masses and no supraclavicular or axillary adenopathy. 3. No obvious abdominal/pelvic metastatic disease or osseous metastatic disease. A few small upper thoracic sclerotic bone lesions require observation. 4. Enlarged fibroid uterus. 5. Enlarged right inguinal lymph nodes and possible prior postoperative changes. Electronically Signed   By: Marijo Sanes M.D.   On: 09/19/2016 14:45    Assessment and Plan:   # Atrial fibrillation/flutter:   # Tachy/brady syndrome: Ms. Gaspari has been in sinus bradycardia in the 30s-50s and then had an episode of atrial fibrillation/flutter today.  Rates were in the 120s.  She has been asymptomatic  throughout.  She is back in sinus rhythm in the 60s-70s.  We will start amiodarone 200mg  bid.  Plan for a slower load given her bradycardia.  If her heart rates get lower or she becomes symptomatic, we will get EP to see her.  Given her metastatic disease and emboli, she likely isn't a good candidate for anticoagulation.  Will defer to neuro and the primary team.  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  Echo pending.  Would add bubble study.  TEE is only helpful if she is a candidate for anticoagulation.     Signed, Skeet Latch, MD  09/19/2016 3:40 PM

## 2016-09-19 NOTE — Progress Notes (Signed)
Patient ID: Deanna Washington, female   DOB: [birth year], 76 y.o.   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 Would recommend full metastatic workup for completeness sake. I will and on seeing the patient later on this afternoon after finishing the [place]. Recommend continue the patient on IV Decadron 10 IV every 6 as well as initiate Keppra 500 twice a day for seizure prophylaxis.

## 2016-09-20 ENCOUNTER — Inpatient Hospital Stay (HOSPITAL_COMMUNITY): Payer: Medicare Other

## 2016-09-20 ENCOUNTER — Encounter (HOSPITAL_COMMUNITY): Payer: Self-pay | Admitting: Radiation Oncology

## 2016-09-20 DIAGNOSIS — R51 Headache: Secondary | ICD-10-CM

## 2016-09-20 DIAGNOSIS — I361 Nonrheumatic tricuspid (valve) insufficiency: Secondary | ICD-10-CM

## 2016-09-20 DIAGNOSIS — R112 Nausea with vomiting, unspecified: Secondary | ICD-10-CM

## 2016-09-20 DIAGNOSIS — I4891 Unspecified atrial fibrillation: Secondary | ICD-10-CM

## 2016-09-20 DIAGNOSIS — I10 Essential (primary) hypertension: Secondary | ICD-10-CM

## 2016-09-20 DIAGNOSIS — R599 Enlarged lymph nodes, unspecified: Secondary | ICD-10-CM

## 2016-09-20 DIAGNOSIS — R001 Bradycardia, unspecified: Secondary | ICD-10-CM

## 2016-09-20 DIAGNOSIS — G939 Disorder of brain, unspecified: Secondary | ICD-10-CM

## 2016-09-20 DIAGNOSIS — C799 Secondary malignant neoplasm of unspecified site: Secondary | ICD-10-CM

## 2016-09-20 DIAGNOSIS — J90 Pleural effusion, not elsewhere classified: Secondary | ICD-10-CM

## 2016-09-20 DIAGNOSIS — R918 Other nonspecific abnormal finding of lung field: Secondary | ICD-10-CM

## 2016-09-20 DIAGNOSIS — Z853 Personal history of malignant neoplasm of breast: Secondary | ICD-10-CM

## 2016-09-20 DIAGNOSIS — I638 Other cerebral infarction: Secondary | ICD-10-CM

## 2016-09-20 DIAGNOSIS — R42 Dizziness and giddiness: Secondary | ICD-10-CM

## 2016-09-20 LAB — BASIC METABOLIC PANEL
Anion gap: 6 (ref 5–15)
BUN: 13 mg/dL (ref 6–20)
CHLORIDE: 108 mmol/L (ref 101–111)
CO2: 28 mmol/L (ref 22–32)
CREATININE: 0.67 mg/dL (ref 0.44–1.00)
Calcium: 9.3 mg/dL (ref 8.9–10.3)
GFR calc Af Amer: >60 mL/min (ref >60)
GFR calc non Af Amer: >60 mL/min (ref >60)
GLUCOSE: 139 mg/dL — AB (ref 65–99)
Potassium: 4.3 mmol/L (ref 3.5–5.1)
SODIUM: 142 mmol/L (ref 135–145)

## 2016-09-20 LAB — VAS US CAROTID
LCCAPDIAS: 9 cm/s
LEFT ECA DIAS: -7 cm/s
LEFT VERTEBRAL DIAS: 9 cm/s
LICADDIAS: -17 cm/s
LICADSYS: -46 cm/s
LICAPDIAS: -10 cm/s
LICAPSYS: -33 cm/s
Left CCA dist dias: -11 cm/s
Left CCA dist sys: -50 cm/s
Left CCA prox sys: 59 cm/s
RCCAPDIAS: 11 cm/s
RIGHT VERTEBRAL DIAS: 7 cm/s
Right CCA prox sys: 54 cm/s
Right cca dist sys: -58 cm/s

## 2016-09-20 LAB — CBC
HEMATOCRIT: 40.4 % (ref 36.0–46.0)
Hemoglobin: 13.6 g/dL (ref 12.0–15.0)
MCH: 32 pg (ref 26.0–34.0)
MCHC: 33.7 g/dL (ref 30.0–36.0)
MCV: 95.1 fL (ref 78.0–100.0)
PLATELETS: 297 10*3/uL (ref 150–400)
RBC: 4.25 MIL/uL (ref 3.87–5.11)
RDW: 12.3 % (ref 11.5–15.5)
WBC: 9.5 10*3/uL (ref 4.0–10.5)

## 2016-09-20 LAB — LIPID PANEL
CHOL/HDL RATIO: 3 ratio
CHOLESTEROL: 157 mg/dL (ref 0–200)
HDL: 52 mg/dL (ref >40)
LDL Cholesterol: 87 mg/dL (ref 0–99)
TRIGLYCERIDES: 91 mg/dL (ref <150)
VLDL: 18 mg/dL (ref 0–40)

## 2016-09-20 LAB — PROTIME-INR
INR: 1.03
Prothrombin Time: 13.5 seconds (ref 11.4–15.2)

## 2016-09-20 LAB — ECHOCARDIOGRAM COMPLETE
Height: 68 in
Weight: 2368 oz

## 2016-09-20 LAB — GLUCOSE, CAPILLARY: GLUCOSE-CAPILLARY: 124 mg/dL — AB (ref 65–99)

## 2016-09-20 LAB — TROPONIN I
Troponin I: <0.03 ng/mL (ref <0.03)
Troponin I: <0.03 ng/mL (ref <0.03)

## 2016-09-20 LAB — HEMOGLOBIN A1C
Hgb A1c MFr Bld: 5.4 % (ref 4.8–5.6)
MEAN PLASMA GLUCOSE: 108.28 mg/dL

## 2016-09-20 LAB — MAGNESIUM: Magnesium: 1.9 mg/dL (ref 1.7–2.4)

## 2016-09-20 MED ORDER — FUROSEMIDE 10 MG/ML IJ SOLN
INTRAMUSCULAR | Status: AC
  Filled 2016-09-20: qty 4

## 2016-09-20 MED ORDER — MIDAZOLAM HCL 2 MG/2ML IJ SOLN
INTRAMUSCULAR | Status: AC | PRN
  Administered 2016-09-20 (×2): 1 mg via INTRAVENOUS

## 2016-09-20 MED ORDER — MIDAZOLAM HCL 2 MG/2ML IJ SOLN
INTRAMUSCULAR | Status: AC
  Administered 2016-09-20: 12:00:00
  Filled 2016-09-20: qty 2

## 2016-09-20 MED ORDER — FENTANYL CITRATE (PF) 100 MCG/2ML IJ SOLN
INTRAMUSCULAR | Status: AC | PRN
  Administered 2016-09-20 (×2): 50 ug via INTRAVENOUS

## 2016-09-20 MED ORDER — FENTANYL CITRATE (PF) 100 MCG/2ML IJ SOLN
INTRAMUSCULAR | Status: AC
  Administered 2016-09-20: 12:00:00
  Filled 2016-09-20: qty 2

## 2016-09-20 MED ORDER — LIDOCAINE-EPINEPHRINE (PF) 2 %-1:200000 IJ SOLN
INTRAMUSCULAR | Status: AC
  Filled 2016-09-20: qty 20

## 2016-09-20 NOTE — Progress Notes (Signed)
PROGRESS NOTE  Zeriyah Wain  KKX:[id number] DOB: 1940-04-26 DOA: 09/18/2016 PCP: Haywood Pao, MD  Brief Narrative:   The patient is a 76 year old female with history of breast cancer status post lumpectomy and radiation 8 years ago who has been in remission and asthma who presented with progressive headache, nausea and vomiting. CT of the head demonstrated a large right parieto-occipital mass like hypodensity insistent with a tumor or metastases with 3 mm of right to left midline shift.  She was admitted and started on Decadron. MRI confirmed a 6 x 5 x 6.4 cm heterogeneous enhancing mass in the posterior right cerebrum spinning but the parietal and occipital lobes with scattered cystic spaces and blood products. There is local mass effect and mild vasogenic edema. There are also 2 additional masses a 10 mm left cerebellar mass and a 3 mm left parietal cortex mass suggestive of metastatic disease. In addition, she had punctate areas of cortical diffusion hyperintensity in the left occipital lobe consistent with small acute infarcts with a subacute ischemia in the upper right cerebellum.  Dr. Saintclair Halsted from neurosurgery reviewed the CT and MRI and felt that this might represent a high-grade glioma or glioblastoma. He increased the patient's Decadron to 10 mg IV every 6 hours and started Keppra for seizure prophylaxis.  CT of the chest/abdomen and pelvis demonstrates numerous lung nodules and mediastinal/hilar lymphadenopathy suggestive of possible prior lung CA vs. Recurrence of her breast cancer.    Assessment & Plan:   Principal Problem:   Brain tumor [facility] [place]) Active Problems:   Asthma   Essential hypertension   Acute embolic stroke [facility])   Atrial fibrillation, new onset (HCC)   Sinus bradycardia   Metastatic cancer (HCC)   Bradycardia   Tachy-brady syndrome (HCC)  Right brain mass with vasogenic edema and midline shift with mild left hand and leg dysmetria, headache and nausea and vomiting.   CT of the chest abdomen and pelvis demonstrated numerous pulmonary nodules, necrotic enlarged mediastinal and hilar lymphadenopathy, a possible adrenal nodule, possible bone metastases, no significant lymphadenopathy in the abdomen but she had some enlarged right inguinal lymph nodes. -  Decadron 10 mg IV every 6 hours -  Keppra 500 mg twice a day -  IR performed right inguinal lymph node biopsy on 8/24, appreciate assistance -  If right inguinal lymph node is nondiagnostic, the next step would be pulmonology for bronch and biopsy -  Oncology consultation:  Dr. Jana Hakim -  XRT Onc:  Dr. Lisbeth Renshaw  -  NSGY:  Dr. Saintclair Halsted  Acute and subacute embolic cerebral infarcts, likely due to a-fib (new diagnosis as of 8/23) -  ECHO with bubble, report pending -  Carotid duplex:  Findings suggest 1-39% internal carotid artery stenosis bilaterally. -  A1c 5.4 -  LDL 87 -  PT/OT/SLP:  Home health PT, cane, 24 hour supervision -  Ongoing speech/congition therapy   New onset a-fib/a-flutter with rates in the 110-120s, asymptomatic.  CHADs2vasc = 5. Sinus bradycardia at baseline.  No evidence of herniation on CT or MRI.  Patient is not on AV nodal blocking medications.  Spontaneously converted to NSR while I was in room discussing with patient -  ECHO pending -  TSH 0.43 -  troponins negative -  Cardiology consult appreciated.   -  EP consult pending -  No A/C due to possible biopsy  RLE edema -  Duplex RLE:  No DVT.  Incidentally found a vascularized hypoechoic homogenous area of the right groin  measuring 2.8cm  HTN, blood pressures mildly elevated  - Continue hydrochlorothiazide and losartan  Asthma, stable, continue Dulera   DVT prophylaxis:  Lovenox Code Status:  Full code Family Communication:  Patient and her daughter who was present at bedside Disposition Plan:  Awaiting further work up for brain mass and metastatic cancer.  EP consult is currently pending.  Tumor board will meet on Monday to  discuss next steps.  If biopsy nondiagnostic, will need bronch with biopsy.   Consultants:   Neurosurgery  Oncology  Radiation oncology  Procedures:  CT head MRI brain CT chest/ab/pelvis Right inguinal lymph node biopsy  Antimicrobials:  Anti-infectives    None       Subjective:  Headache persists.  Mild intermittent nausea but no vomiting.  Denies focal numbness, tingling, weakness.  Denies chest pains, lightheadedness, SOB.    Objective: Vitals:   09/20/16 1200 09/20/16 1204 09/20/16 1215 09/20/16 1220  BP: (!) 153/72 (!) 153/72 (!) 156/73 139/70  Pulse:  (!) 52 (!) 49 62  Resp: (!) 21 16 14 18   Temp:      TempSrc:      SpO2:  98% 99% 90%  Weight:      Height:        Intake/Output Summary (Last 24 hours) at 09/20/16 1532 Last data filed at 09/20/16 1200  Gross per 24 hour  Intake             1725 ml  Output             1150 ml  Net              575 ml   Filed Weights   09/18/16 1546  Weight: 67.1 kg (148 lb)    Examination:  General exam:  Adult Female.  No acute distress.   HEENT:  NCAT, MMM Respiratory system: Clear to auscultation bilaterally Cardiovascular system: Bradycardic, regular rhythm, normal S1/S2. No murmurs, rubs, gallops or clicks.  Warm extremities Gastrointestinal system: Normal active bowel sounds, soft, nondistended, nontender. MSK:  Normal tone and bulk, 2+ right lower extremity edema, no LLE edema Neuro:  Cranial nerves II through XII grossly intact. Strength 5 out of 5 throughout. Sensation intact to light touch throughout. Mild dysmetria of the left upper and lower extremities. Right groin:  Palpable 1-2 cm mass or lymph node Psych:  Has some cognitive processing difficulties/ difficulty following instructions and some confusion when answering questions.      Data Reviewed: I have personally reviewed following labs and imaging studies  CBC:  Recent Labs Lab 09/18/16 1659 09/19/16 0319 09/20/16 0253  WBC 10.8* 11.1*  9.5  HGB 14.9 13.7 13.6  HCT 43.3 40.6 40.4  MCV 93.5 96.0 95.1  PLT 286 261 299   Basic Metabolic Panel:  Recent Labs Lab 09/18/16 1659 09/19/16 0319 09/20/16 0253  NA 138 138 142  K 3.6 3.8 4.3  CL 103 105 108  CO2 26 27 28   GLUCOSE 112* 126* 139*  BUN 12 12 13   CREATININE 0.74 0.78 0.67  CALCIUM 9.6 8.9 9.3  MG  --   --  1.9   GFR: Estimated Creatinine Clearance: 62.2 mL/min (by C-G formula based on SCr of 0.67 mg/dL). Liver Function Tests:  Recent Labs Lab 09/19/16 0319  AST 21  ALT 18  ALKPHOS 61  BILITOT 0.6  PROT 6.3*  ALBUMIN 3.7    Recent Labs Lab 09/19/16 0319  LIPASE 29   No results for input(s): AMMONIA in  the last 168 hours. Coagulation Profile:  Recent Labs Lab 09/20/16 0253  INR 1.03   Cardiac Enzymes:  Recent Labs Lab 09/19/16 1710 09/19/16 2302 09/20/16 0253  TROPONINI <0.03 <0.03 <0.03   BNP (last 3 results) No results for input(s): PROBNP in the last 8760 hours. HbA1C:  Recent Labs  09/20/16 0253  HGBA1C 5.4   CBG:  Recent Labs Lab 09/18/16 1711 09/19/16 0817 09/20/16 0858  GLUCAP 107* 116* 124*   Lipid Profile:  Recent Labs  09/20/16 0253  CHOL 157  HDL 52  LDLCALC 87  TRIG 91  CHOLHDL 3.0   Thyroid Function Tests:  Recent Labs  09/19/16 1511  TSH 0.430  FREET4 0.90   Anemia Panel: No results for input(s): VITAMINB12, FOLATE, FERRITIN, TIBC, IRON, RETICCTPCT in the last 72 hours. Urine analysis:    Component Value Date/Time   COLORURINE AMBER (A) 09/18/2016 1659   APPEARANCEUR HAZY (A) 09/18/2016 1659   LABSPEC 1.027 09/18/2016 1659   PHURINE 5.0 09/18/2016 1659   GLUCOSEU NEGATIVE 09/18/2016 1659   HGBUR SMALL (A) 09/18/2016 1659   BILIRUBINUR NEGATIVE 09/18/2016 1659   KETONESUR 80 (A) 09/18/2016 1659   PROTEINUR 30 (A) 09/18/2016 1659   NITRITE NEGATIVE 09/18/2016 1659   LEUKOCYTESUR MODERATE (A) 09/18/2016 1659   Sepsis Labs: @LABRCNTIP (procalcitonin:4,lacticidven:4)  ) Recent  Results (from the past 240 hour(s))  Rapid strep screen     Status: None   Collection Time: 09/18/16  5:20 PM  Result Value Ref Range Status   Streptococcus, Group A Screen (Direct) NEGATIVE NEGATIVE Final    Comment: (NOTE) A Rapid Antigen test may result negative if the antigen level in the sample is below the detection level of this test. The FDA has not cleared this test as a stand-alone test therefore the rapid antigen negative result has reflexed to a Group A Strep culture.   Culture, group A strep     Status: None (Preliminary result)   Collection Time: 09/18/16  5:20 PM  Result Value Ref Range Status   Specimen Description THROAT  Final   Special Requests NONE Reflexed from [id number]  Final   Culture   Final    CULTURE REINCUBATED FOR BETTER GROWTH Performed at [facility], [place] [address], [place], [place] [address]    Report Status PENDING  Incomplete  MRSA PCR Screening     Status: None   Collection Time: 09/19/16  2:55 AM  Result Value Ref Range Status   MRSA by PCR NEGATIVE NEGATIVE Final    Comment:        The GeneXpert MRSA Assay (FDA approved for NASAL specimens only), is one component of a comprehensive MRSA colonization surveillance program. It is not intended to diagnose MRSA infection nor to guide or monitor treatment for MRSA infections.       Radiology Studies: Dg Chest 2 View  Result Date: 09/18/2016 CLINICAL DATA:  Generalized body aches, fever, chills, nausea and vomiting for 3 days. EXAM: CHEST  2 VIEW COMPARISON:  None. FINDINGS: There is cardiomegaly and vascular congestion. No consolidative process or pneumothorax. Trace bilateral pleural effusions noted. No acute bony abnormality. IMPRESSION: Cardiomegaly and pulmonary vascular congestion. Trace bilateral pleural effusions. Electronically Signed   By: Inge Rise M.D.   On: 09/18/2016 19:53   Ct Head Wo Contrast  Result Date: 09/18/2016 CLINICAL DATA:  Generalized body aches, fever,  chills, nausea and vomiting for 3 days. RIGHT eye and ear pain. EXAM: CT HEAD WITHOUT CONTRAST TECHNIQUE: Contiguous  axial images were obtained from the base of the skull through the vertex without intravenous contrast. COMPARISON:  None. FINDINGS: BRAIN: 4.3 x 7.6 cm rounded hypodensity with heterogeneous component RIGHT prop occipital lobe effacing the RIGHT atrium. Surrounding apparent vasogenic edema. Local mass-effect, 3 mm RIGHT to LEFT midline shift. No intraparenchymal hemorrhage. No hydrocephalus. No abnormal extra-axial fluid collections. VASCULAR: Moderate calcific atherosclerosis of the carotid siphons. SKULL: No skull fracture. No significant scalp soft tissue swelling. SINUSES/ORBITS: Trace paranasal sinus mucosal thickening. Mastoid air cells are well aerated. The included ocular globes and orbital contents are non-suspicious. Status post bilateral ocular lens implants. OTHER: None. IMPRESSION: 1. Large RIGHT parieto-occipital masslike hypodensity, differential diagnosis includes tumor/metastasis, cerebritis or less likely subacute MCA/ posterior watershed territory infarct. 3 mm RIGHT to LEFT midline shift. Recommend MRI of the brain with and without contrast. Acute findings discussed with and reconfirmed by [facility] NANAVATI on 09/18/2016 at 10:20 pm. Electronically Signed   By: Elon Alas M.D.   On: 09/18/2016 22:23   Ct Chest W Contrast  Result Date: 09/19/2016 CLINICAL DATA:  Remote history of breast cancer with recent brain MRI demonstrating metastatic brain lesions. Restaging CT scans. EXAM: CT CHEST, ABDOMEN, AND PELVIS WITH CONTRAST TECHNIQUE: Multidetector CT imaging of the chest, abdomen and pelvis was performed following the standard protocol during bolus administration of intravenous contrast. CONTRAST:  155mL ISOVUE-300 IOPAMIDOL (ISOVUE-300) INJECTION 61% COMPARISON:  None. FINDINGS: CT CHEST FINDINGS Cardiovascular: The heart is normal in size. No pericardial effusion. Mild  tortuosity and calcification of the thoracic aorta but no aneurysm or dissection. The branch vessels are patent. Scattered coronary artery calcifications are noted. Mediastinum/Nodes: Necrotic appearing mediastinal lymphadenopathy. 3.3 x 2.1 cm nodal mass in the aorticopulmonary window on image number 25. There is also cystic/necrotic subcarinal lymphadenopathy with maximum Remberto Lienhard axis diameter of 25 mm on image number 33. Other scattered smaller lymph nodes are noted including a 9 mm right infrahilar lymph node on image number 38. The esophagus is grossly normal. Lungs/Pleura: Diffuse pulmonary metastatic disease with numerous bilateral pulmonary nodules. Index lesion in the right middle lobe on image number 115 measures 12 x 11 mm. Left upper lobe nodule on image number 85 measures 7 x 5 mm. Right lower lobe lesion just above the right hemidiaphragm on image number 124 measures 14.5 x 12.5 mm. Large necrotic lesion in the as ago esophageal recess could be a necrotic metastatic lung lesion or a necrotic lymph node. It measures 4.2 by 3.0 cm. Small bilateral pleural effusions and bibasilar atelectasis. Chest wall/ Musculoskeletal: Surgical clips surrounding a cystic area in the right breast, likely small liquified hematoma related to a prior lumpectomy. No obvious breast masses. No axillary lymphadenopathy. No supraclavicular lymphadenopathy. The thyroid gland appears normal. No definite metastatic bone disease. There is a small sclerotic lesion noted in the T1 vertebral body and also in the T3 vertebral body. I do not however see any other definite lesions to suggest metastatic disease. CT ABDOMEN PELVIS FINDINGS Hepatobiliary: No definite findings for hepatic metastatic disease. Layering high attenuation material in the gallbladder could be sludge or stones. No findings for acute cholecystitis. Pancreas: No mass, inflammation or ductal dilatation. Spleen: Normal size.  No focal lesions. Adrenals/Urinary Tract: The  adrenal glands are unremarkable. Somewhat irregular appearing left upper pole cystic lesion measures less than 20 Hounsfield units and is most likely a cyst. No worrisome renal lesions or hydronephrosis. Stomach/Bowel: The stomach, duodenum, small bowel and colon are grossly normal. No acute inflammatory process,  mass lesions or obstructive findings. Colonic diverticulosis without findings for acute diverticulitis. Vascular/Lymphatic: Moderate atherosclerotic calcifications involving a tortuous abdominal aorta but no focal aneurysm or dissection. The branch vessels are patent. The major venous structures are patent. 11.5 mm gastrohepatic ligament lymph node is noted on image number 60. There is also a low-attenuation retrocrural lymph node measures 11 mm on image number 59. No mesenteric or retroperitoneal mass or lymphadenopathy. Reproductive: The enlarged fibroid uterus. The ovaries appear normal. Other: No pelvic lymphadenopathy. There are enlarged right inguinal lymph nodes. Suspect postoperative changes in the right inguinal area. Musculoskeletal: No obvious metastatic bone disease. IMPRESSION: 1. Pulmonary metastatic disease and fairly extensive necrotic appearing mediastinal and hilar lymphadenopathy. 2. No obvious breast masses and no supraclavicular or axillary adenopathy. 3. No obvious abdominal/pelvic metastatic disease or osseous metastatic disease. A few small upper thoracic sclerotic bone lesions require observation. 4. Enlarged fibroid uterus. 5. Enlarged right inguinal lymph nodes and possible prior postoperative changes. Electronically Signed   By: Marijo Sanes M.D.   On: 09/19/2016 14:45   Mr Jeri Cos JM Contrast  Result Date: 09/19/2016 CLINICAL DATA:  Abnormal head CT. Headaches. History breast cancer. EXAM: MRI HEAD WITHOUT AND WITH CONTRAST TECHNIQUE: Multiplanar, multiecho pulse sequences of the brain and surrounding structures were obtained without and with intravenous contrast. CONTRAST:   56mL MULTIHANCE GADOBENATE DIMEGLUMINE 529 MG/ML IV SOLN COMPARISON:  Head CT from yesterday FINDINGS: Brain: Large heterogeneously enhancing mass in the posterior right cerebrum, spanning the parietal and occipital lobes and measuring up to 6 x 5 x 6.4 cm. This mass contains scattered cystic spaces and probable blood products. The overlying cortex is not visible and there is a thin line of overlying dural thickening. There is local mass effect, mild for the size of tumor. Mild vasogenic edema. 10 mm left cerebellar and 3 mm left parietal cortex enhancing masses, overall pattern suggesting metastatic disease. Punctate areas of cortical diffusion hyperintensity in the left occipital pole consistent with small infarcts. Mild restricted diffusion seen in the right cerebellum, suggesting subacute ischemia. Diffusion within the right-sided mass from blood products or other paramagnetic substance. There is scattered FLAIR hyperintensities in the cerebral white matter consistent with chronic small vessel ischemia. Vascular: Major flow voids are preserved. Skull and upper cervical spine: No aggressive marrow lesion. Right parietal bone lesion has intrinsic T1 hyperintensity most consistent with hemangioma. Sinuses/Orbits: Bilateral cataract resection. Mild chronic sinusitis. IMPRESSION: 1. Metastatic pattern with 3 brain masses. The largest is in the posterior right cerebrum measuring up to 6.4 cm; local mass effect. The other lesions measure 1 cm in the left cerebellum and 3 mm in the left parietal cortex. 2. 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 Subacute ischemia in the upper right cerebellum. Electronically Signed   By: Monte Fantasia M.D.   On: 09/19/2016 07:49   Ct Abdomen Pelvis W Contrast  Result Date: 09/19/2016 CLINICAL DATA:  Remote history of breast cancer with recent brain MRI demonstrating metastatic brain lesions. Restaging CT scans. EXAM: CT CHEST, ABDOMEN, AND PELVIS WITH CONTRAST  TECHNIQUE: Multidetector CT imaging of the chest, abdomen and pelvis was performed following the standard protocol during bolus administration of intravenous contrast. CONTRAST:  122mL ISOVUE-300 IOPAMIDOL (ISOVUE-300) INJECTION 61% COMPARISON:  None. FINDINGS: CT CHEST FINDINGS Cardiovascular: The heart is normal in size. No pericardial effusion. Mild tortuosity and calcification of the thoracic aorta but no aneurysm or dissection. The branch vessels are patent. Scattered coronary artery calcifications are noted. Mediastinum/Nodes:  Necrotic appearing mediastinal lymphadenopathy. 3.3 x 2.1 cm nodal mass in the aorticopulmonary window on image number 25. There is also cystic/necrotic subcarinal lymphadenopathy with maximum Aylen Stradford axis diameter of 25 mm on image number 33. Other scattered smaller lymph nodes are noted including a 9 mm right infrahilar lymph node on image number 38. The esophagus is grossly normal. Lungs/Pleura: Diffuse pulmonary metastatic disease with numerous bilateral pulmonary nodules. Index lesion in the right middle lobe on image number 115 measures 12 x 11 mm. Left upper lobe nodule on image number 85 measures 7 x 5 mm. Right lower lobe lesion just above the right hemidiaphragm on image number 124 measures 14.5 x 12.5 mm. Large necrotic lesion in the as ago esophageal recess could be a necrotic metastatic lung lesion or a necrotic lymph node. It measures 4.2 by 3.0 cm. Small bilateral pleural effusions and bibasilar atelectasis. Chest wall/ Musculoskeletal: Surgical clips surrounding a cystic area in the right breast, likely small liquified hematoma related to a prior lumpectomy. No obvious breast masses. No axillary lymphadenopathy. No supraclavicular lymphadenopathy. The thyroid gland appears normal. No definite metastatic bone disease. There is a small sclerotic lesion noted in the T1 vertebral body and also in the T3 vertebral body. I do not however see any other definite lesions to suggest  metastatic disease. CT ABDOMEN PELVIS FINDINGS Hepatobiliary: No definite findings for hepatic metastatic disease. Layering high attenuation material in the gallbladder could be sludge or stones. No findings for acute cholecystitis. Pancreas: No mass, inflammation or ductal dilatation. Spleen: Normal size.  No focal lesions. Adrenals/Urinary Tract: The adrenal glands are unremarkable. Somewhat irregular appearing left upper pole cystic lesion measures less than 20 Hounsfield units and is most likely a cyst. No worrisome renal lesions or hydronephrosis. Stomach/Bowel: The stomach, duodenum, small bowel and colon are grossly normal. No acute inflammatory process, mass lesions or obstructive findings. Colonic diverticulosis without findings for acute diverticulitis. Vascular/Lymphatic: Moderate atherosclerotic calcifications involving a tortuous abdominal aorta but no focal aneurysm or dissection. The branch vessels are patent. The major venous structures are patent. 11.5 mm gastrohepatic ligament lymph node is noted on image number 60. There is also a low-attenuation retrocrural lymph node measures 11 mm on image number 59. No mesenteric or retroperitoneal mass or lymphadenopathy. Reproductive: The enlarged fibroid uterus. The ovaries appear normal. Other: No pelvic lymphadenopathy. There are enlarged right inguinal lymph nodes. Suspect postoperative changes in the right inguinal area. Musculoskeletal: No obvious metastatic bone disease. IMPRESSION: 1. Pulmonary metastatic disease and fairly extensive necrotic appearing mediastinal and hilar lymphadenopathy. 2. No obvious breast masses and no supraclavicular or axillary adenopathy. 3. No obvious abdominal/pelvic metastatic disease or osseous metastatic disease. A few small upper thoracic sclerotic bone lesions require observation. 4. Enlarged fibroid uterus. 5. Enlarged right inguinal lymph nodes and possible prior postoperative changes. Electronically Signed   By: Marijo Sanes M.D.   On: 09/19/2016 14:45   US Biopsy  Result Date: 09/20/2016 INDICATION: METASTATIC DISEASE, BRAIN METASTASES, INGUINAL ADENOPATHY, UNKNOWN PRIMARY EXAM: ULTRASOUND RIGHT INGUINAL ADENOPATHY 18 GAUGE CORE BIOPSY MEDICATIONS: 1% lidocaine local ANESTHESIA/SEDATION: Moderate (conscious) sedation was employed during this procedure. A total of Versed 2.0 mg and Fentanyl 100 mcg was administered intravenously. Moderate Sedation Time: 10 Minutes. The patient's level of consciousness and vital signs were monitored continuously by radiology nursing throughout the procedure under my direct supervision. FLUOROSCOPY TIME:  Fluoroscopy Time: None. COMPLICATIONS: None immediate. PROCEDURE: Informed written consent was obtained from the patient after a thorough discussion of the  procedural risks, benefits and alternatives. All questions were addressed. Maximal Sterile Barrier Technique was utilized including caps, mask, sterile gowns, sterile gloves, sterile drape, hand hygiene and skin antiseptic. A timeout was performed prior to the initiation of the procedure. Previous imaging reviewed. Preliminary ultrasound performed. Right inguinal abnormal adenopathy localized. Overlying skin marked. Under sterile conditions and local anesthesia, an 18 gauge core biopsy medial was advanced to the right inguinal abnormal adenopathy. 5 18 gauge core biopsies obtained. Samples placed in saline. Needle removed. Postprocedure imaging demonstrates no hemorrhage or hematoma. Patient tolerated the biopsy well. No Complication. IMPRESSION: Successful ultrasound right inguinal adenopathy 18 gauge core biopsies Electronically Signed   By: Jerilynn Mages.  Shick M.D.   On: 09/20/2016 12:50     Scheduled Meds: . amiodarone  200 mg Oral BID  . aspirin  300 mg Rectal Daily   Or  . aspirin  325 mg Oral Daily  . dexamethasone  10 mg Intravenous Q6H  . enoxaparin (LOVENOX) injection  40 mg Subcutaneous Q24H  . losartan  50 mg Oral Daily    And  . hydrochlorothiazide  12.5 mg Oral Daily  . levETIRAcetam  500 mg Oral BID  . lidocaine-EPINEPHrine      . mometasone-formoterol  2 puff Inhalation BID  . pantoprazole (PROTONIX) IV  40 mg Intravenous QHS   Continuous Infusions: . sodium chloride 75 mL/hr at 09/20/16 1200     LOS: 1 day    Time spent: 30 min    Janece Canterbury, MD [facility] Pager [phone]  If 7PM-7AM, please contact night-coverage [email or web address] Password [facility] 09/20/2016, 3:32 PM

## 2016-09-20 NOTE — Procedures (Signed)
S/p rt inguinal LN core bx  EBL 0 No comp Stable Path pending Full report in PACS

## 2016-09-20 NOTE — Consult Note (Signed)
[place]  Telephone:([phone]) [phone] [phone]) [phone]     ID: [name] DOB: [id number]  MR#: [id number]  GDJ#:[id number]  Patient Care Team: Quincy Carnes, FNP as PCP - General (Family Medicine) Magrinat, Virgie Dad, MD as Consulting Physician (Oncology) Kyung Rudd, MD as Consulting Physician (Radiation Oncology) Kary Kos, MD as Consulting Physician (Neurosurgery) Chauncey Cruel, MD OTHER MD:  CHIEF COMPLAINT: Apparent brain and lung metastases, pathology pending  CURRENT TREATMENT: Considering surgery, systemic treatment   HISTORY OF CURRENT ILLNESS: Marlen underwent right lumpectomy approximately 7 years ago in [place]. She does not recall the size of the tumor or the name of her physician except to say that she received postoperative radiation but no chemotherapy or anti-estrogens.  More recently [place] presented to the emergency room at [facility] 09/18/2016 complaining of headaches, nausea, vomiting, and dizziness. A noncontrast head CT was obtained which showed what appeared to be a 7.6 rounded hypodensity in the right occipital lobe, with significant edema. There was a 3 mm right to left shift. She was admitted, stabilized, and on 09/19/2016 had a brain MRI with and without contrast, showing a 6.4 cm right cerebral mass spanning the parietal and occipital lobes, a separate 1 cm mass in the left cerebellum, and a 0.3 cm mass in the left parietal cortex. There was a cluster of small acute-appearing infarct in the left occipital pole.  Neurosurgery was consulted and initially felt this was likely to be a glioblastoma. This suggested completing staging studies which were obtained 09/19/2016. The CT of the chest abdomen and pelvis showed multiple bilateral lung lesions, significant mediastinal, aorto pulmonary and subcarinal lymphadenopathy, small bilateral effusions, and incidentally surgical clips around a cystic area in the right breast, likely the  site of the patient's earlier breast cancer.  There are small sclerotic lesions at T1 and T3 which are not clearly metastatic. The liver was not clearly involved.  The patient's subsequent history is as detailed below.  INTERVAL HISTORY: I met with the patient in her hospital room and later visited with her daughters Butch Penny and [name] also in the room as well as the patient's granddaughter (works at [employer] in [employer]).  REVIEW OF SYSTEMS: The patient is currently receiving steroids. They have apparently significant cleared her sensorium. She tells me her headache has completely resolved. She has no nausea and no vomiting problems. She denies shortness of breath, cough, or pleurisy. She has not had a bowel movement in 48 hours and of course she has not been eating very much either. She is not aware of any recent breast changes. A detailed review of systems today was otherwise noncontributory  PAST MEDICAL HISTORY: Past Medical History:  Diagnosis Date  . Asthma   . Breast cancer ([place])   . HTN (hypertension)     PAST SURGICAL HISTORY: Past Surgical History:  Procedure Laterality Date  . BREAST LUMPECTOMY      FAMILY HISTORY No family history on file.  GYNECOLOGIC HISTORY:  No LMP recorded. Patient is postmenopausal. Menarche late, around age 24; first live birth age 76. The patient has 5 children. She status post bilateral tubal ligation. 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 Her husband Marcello Moores has significant medical problems related to rectal cancer, including fistula problems, his suprapubic cath, and an ostomy, all of which are generally taken care of by Izora Gala. Her children are [employer], lives in [place] and  works as a Writer; son Laverna Peace who lives in [place] and owns rental property there; daughter Butch Penny who lives in [place] and works in Engineer, mining as; son Kyung Rudd who lives in  [place] and works as a Development worker, international aid; and son Legrand Como who lives with the patient and is currently primary caregiver to his father, the patient's husband Marcello Moores. The patient has 8 grandchildren and 3 great-grandchildren. She is a Furniture conservator/restorer    ADVANCED DIRECTIVES:    HEALTH MAINTENANCE: Social History  Substance Use Topics  . Smoking status: Never Smoker  . Smokeless tobacco: Never Used  . Alcohol use Not on file     Colonoscopy:  PAP:  Bone density:   No Known Allergies  Current Facility-Administered Medications  Medication Dose Route Frequency Provider Last Rate Last Dose  . 0.9 %  sodium chloride infusion   Intravenous Continuous Gennaro Africa, MD 75 mL/hr at 09/20/16 1700    . acetaminophen (TYLENOL) tablet 650 mg  650 mg Oral Q6H PRN Gennaro Africa, MD   650 mg at 09/19/16 0731  . albuterol (PROVENTIL) (2.5 MG/3ML) 0.083% nebulizer solution 3 mL  3 mL Inhalation Q4H PRN Gennaro Africa, MD      . amiodarone (PACERONE) tablet 200 mg  200 mg Oral BID Skeet Latch, MD   Stopped at 09/19/16 2200  . aspirin suppository 300 mg  300 mg Rectal Daily Short, Mackenzie, MD       Or  . aspirin tablet 325 mg  325 mg Oral Daily Short, Mackenzie, MD      . dexamethasone (DECADRON) injection 10 mg  10 mg Intravenous Q6H Kary Kos, MD   10 mg at 09/20/16 1734  . diphenhydrAMINE (BENADRYL) capsule 25 mg  25 mg Oral Q6H PRN Gennaro Africa, MD      . enoxaparin (LOVENOX) injection 40 mg  40 mg Subcutaneous Q24H Brynda Greathouse Sue-Ellen, PA      . losartan (COZAAR) tablet 50 mg  50 mg Oral Daily Gennaro Africa, MD   50 mg at 09/20/16 4034   And  . hydrochlorothiazide (MICROZIDE) capsule 12.5 mg  12.5 mg Oral Daily Gennaro Africa, MD   12.5 mg at 09/20/16 0950  . levETIRAcetam (KEPPRA) tablet 500 mg  500 mg Oral BID Kary Kos, MD   500 mg at 09/20/16 0950  . lidocaine-EPINEPHrine (XYLOCAINE W/EPI) 2 %-1:200000 (PF) injection           . mometasone-formoterol (DULERA) 200-5 MCG/ACT inhaler 2 puff  2  puff Inhalation BID Gennaro Africa, MD   2 puff at 09/20/16 0920  . morphine 2 MG/ML injection 2 mg  2 mg Intravenous Q4H PRN Gennaro Africa, MD   2 mg at 09/19/16 1441  . ondansetron (ZOFRAN) injection 4 mg  4 mg Intravenous Q6H PRN Gennaro Africa, MD   4 mg at 09/19/16 1947  . pantoprazole (PROTONIX) injection 40 mg  40 mg Intravenous QHS Gennaro Africa, MD   40 mg at 09/19/16 2131    OBJECTIVE: Middle-aged white woman examined in bed  Vitals:   09/20/16 1600 09/20/16 1700  BP: 91/72 (!) 148/74  Pulse: 83 (!) 50  Resp: 18 19  Temp:    SpO2: 95% 95%     Body mass index is 22.5 kg/m.   Wt Readings from Last 3 Encounters:  09/18/16 148 lb (67.1 kg)  09/19/16 148 lb (67.1 kg)      ECOG FS:3 - Symptomatic, >50% confined to bed  Ocular: Sclerae unicteric, pupils round and equal,  EOMs intact Ear-nose-throat: Oropharynx clear and moist Lungs no rales or rhonchi Heart regular rate and rhythm Abd soft, nontender, positive bowel sounds Neuro: non-focal, well-oriented, appropriate affect Breasts: I do not palpate a mass in either breast. The right breast is status post lumpectomy and radiation. There is no evidence of local recurrence. Both axillae are benign.   LAB RESULTS:  CMP     Component Value Date/Time   NA 142 09/20/2016 0253   K 4.3 09/20/2016 0253   CL 108 09/20/2016 0253   CO2 28 09/20/2016 0253   GLUCOSE 139 (H) 09/20/2016 0253   BUN 13 09/20/2016 0253   CREATININE 0.67 09/20/2016 0253   CALCIUM 9.3 09/20/2016 0253   PROT 6.3 (L) 09/19/2016 0319   ALBUMIN 3.7 09/19/2016 0319   AST 21 09/19/2016 0319   ALT 18 09/19/2016 0319   ALKPHOS 61 09/19/2016 0319   BILITOT 0.6 09/19/2016 0319   GFRNONAA >60 09/20/2016 0253   GFRAA >60 09/20/2016 0253    No results found for: TOTALPROTELP, ALBUMINELP, A1GS, A2GS, BETS, BETA2SER, GAMS, MSPIKE, SPEI  No results found for: Nils Pyle, [facility]  Lab Results  Component Value Date   WBC 9.5 09/20/2016   HGB  13.6 09/20/2016   HCT 40.4 09/20/2016   MCV 95.1 09/20/2016   PLT 297 09/20/2016    _0 @  No results found for: LABCA2  No components found for: [id number]   Recent Labs Lab 09/20/16 0253  INR 1.03    No results found for: LABCA2  No results found for: WUJ811  No results found for: BJY782  No results found for: NFA213  No results found for: CA2729  No components found for: HGQUANT  No results found for: CEA1 / No results found for: CEA1   No results found for: AFPTUMOR  No results found for: CHROMOGRNA  No results found for: PSA1  Admission on 09/18/2016  Component Date Value Ref Range Status  . Sodium 09/18/2016 138  135 - 145 mmol/L Final  . Potassium 09/18/2016 3.6  3.5 - 5.1 mmol/L Final  . Chloride 09/18/2016 103  101 - 111 mmol/L Final  . CO2 09/18/2016 26  22 - 32 mmol/L Final  . Glucose, Bld 09/18/2016 112* 65 - 99 mg/dL Final  . BUN 09/18/2016 12  6 - 20 mg/dL Final  . Creatinine, Ser 09/18/2016 0.74  0.44 - 1.00 mg/dL Final  . Calcium 09/18/2016 9.6  8.9 - 10.3 mg/dL Final  . GFR calc non Af Amer 09/18/2016 >60  >60 mL/min Final  . GFR calc Af Amer 09/18/2016 >60  >60 mL/min Final   Comment: (NOTE) The eGFR has been calculated using the CKD EPI equation. This calculation has not been validated in all clinical situations. eGFR's persistently <60 mL/min signify possible Chronic Kidney Disease.   . Anion gap 09/18/2016 9  5 - 15 Final  . WBC 09/18/2016 10.8* 4.0 - 10.5 K/uL Final  . RBC 09/18/2016 4.63  3.87 - 5.11 MIL/uL Final  . Hemoglobin 09/18/2016 14.9  12.0 - 15.0 g/dL Final  . HCT 09/18/2016 43.3  36.0 - 46.0 % Final  . MCV 09/18/2016 93.5  78.0 - 100.0 fL Final  . MCH 09/18/2016 32.2  26.0 - 34.0 pg Final  . MCHC 09/18/2016 34.4  30.0 - 36.0 g/dL Final  . RDW 09/18/2016 12.1  11.5 - 15.5 % Final  . Platelets 09/18/2016 286  150 - 400 K/uL Final  . Color, Urine 09/18/2016 AMBER* YELLOW Final   BIOCHEMICALS  MAY BE AFFECTED BY  COLOR  . APPearance 09/18/2016 HAZY* CLEAR Final  . Specific Gravity, Urine 09/18/2016 1.027  1.005 - 1.030 Final  . pH 09/18/2016 5.0  5.0 - 8.0 Final  . Glucose, UA 09/18/2016 NEGATIVE  NEGATIVE mg/dL Final  . Hgb urine dipstick 09/18/2016 SMALL* NEGATIVE Final  . Bilirubin Urine 09/18/2016 NEGATIVE  NEGATIVE Final  . Ketones, ur 09/18/2016 80* NEGATIVE mg/dL Final  . Protein, ur 09/18/2016 30* NEGATIVE mg/dL Final  . Nitrite 09/18/2016 NEGATIVE  NEGATIVE Final  . Leukocytes, UA 09/18/2016 MODERATE* NEGATIVE Final  . RBC / HPF 09/18/2016 0-5  0 - 5 RBC/hpf Final  . WBC, UA 09/18/2016 6-30  0 - 5 WBC/hpf Final  . Bacteria, UA 09/18/2016 NONE SEEN  NONE SEEN Final  . Squamous Epithelial / LPF 09/18/2016 0-5* NONE SEEN Final  . Mucus 09/18/2016 PRESENT   Final  . Glucose-Capillary 09/18/2016 107* 65 - 99 mg/dL Final  . Troponin i, poc 09/18/2016 0.00  0.00 - 0.08 ng/mL Final  . Comment 3 09/18/2016          Final   Comment: Due to the release kinetics of cTnI, a negative result within the first hours of the onset of symptoms does not rule out myocardial infarction with certainty. If myocardial infarction is still suspected, repeat the test at appropriate intervals.   . Streptococcus, Group A Screen (Dir* 09/18/2016 NEGATIVE  NEGATIVE Final   Comment: (NOTE) A Rapid Antigen test may result negative if the antigen level in the sample is below the detection level of this test. The FDA has not cleared this test as a stand-alone test therefore the rapid antigen negative result has reflexed to a Group A Strep culture.   Marland Kitchen Specimen Description 09/18/2016 THROAT   Final  . Special Requests 09/18/2016 NONE Reflexed from [id number]   Final  . Culture 09/18/2016    Final                   Value:CULTURE REINCUBATED FOR BETTER GROWTH Performed at [facility], [place] [address], [place], [place] [address]   . Report Status 09/18/2016 PENDING   Incomplete  . Lipase 09/19/2016 29  11 - 51 U/L  Final  . Sodium 09/19/2016 138  135 - 145 mmol/L Final  . Potassium 09/19/2016 3.8  3.5 - 5.1 mmol/L Final  . Chloride 09/19/2016 105  101 - 111 mmol/L Final  . CO2 09/19/2016 27  22 - 32 mmol/L Final  . Glucose, Bld 09/19/2016 126* 65 - 99 mg/dL Final  . BUN 09/19/2016 12  6 - 20 mg/dL Final  . Creatinine, Ser 09/19/2016 0.78  0.44 - 1.00 mg/dL Final  . Calcium 09/19/2016 8.9  8.9 - 10.3 mg/dL Final  . Total Protein 09/19/2016 6.3* 6.5 - 8.1 g/dL Final  . Albumin 09/19/2016 3.7  3.5 - 5.0 g/dL Final  . AST 09/19/2016 21  15 - 41 U/L Final  . ALT 09/19/2016 18  14 - 54 U/L Final  . Alkaline Phosphatase 09/19/2016 61  38 - 126 U/L Final  . Total Bilirubin 09/19/2016 0.6  0.3 - 1.2 mg/dL Final  . GFR calc non Af Amer 09/19/2016 >60  >60 mL/min Final  . GFR calc Af Amer 09/19/2016 >60  >60 mL/min Final   Comment: (NOTE) The eGFR has been calculated using the CKD EPI equation. This calculation has not been validated in all clinical situations. eGFR's persistently <60 mL/min signify possible Chronic Kidney Disease.   Marland Kitchen  Anion gap 09/19/2016 6  5 - 15 Final  . WBC 09/19/2016 11.1* 4.0 - 10.5 K/uL Final  . RBC 09/19/2016 4.23  3.87 - 5.11 MIL/uL Final  . Hemoglobin 09/19/2016 13.7  12.0 - 15.0 g/dL Final  . HCT 09/19/2016 40.6  36.0 - 46.0 % Final  . MCV 09/19/2016 96.0  78.0 - 100.0 fL Final  . MCH 09/19/2016 32.4  26.0 - 34.0 pg Final  . MCHC 09/19/2016 33.7  30.0 - 36.0 g/dL Final  . RDW 09/19/2016 12.4  11.5 - 15.5 % Final  . Platelets 09/19/2016 261  150 - 400 K/uL Final  . MRSA by PCR 09/19/2016 NEGATIVE  NEGATIVE Final   Comment:        The GeneXpert MRSA Assay (FDA approved for NASAL specimens only), is one component of a comprehensive MRSA colonization surveillance program. It is not intended to diagnose MRSA infection nor to guide or monitor treatment for MRSA infections.   . Right CCA prox sys 09/19/2016 54  cm/s Final  . Right CCA prox dias 09/19/2016 11  cm/s Final    . Right cca dist sys 09/19/2016 -58  cm/s Final  . Left CCA prox sys 09/19/2016 59  cm/s Final  . Left CCA prox dias 09/19/2016 9  cm/s Final  . Left CCA dist sys 09/19/2016 -50  cm/s Final  . Left CCA dist dias 09/19/2016 -11  cm/s Final  . Left ICA prox sys 09/19/2016 -33  cm/s Final  . Left ICA prox dias 09/19/2016 -10  cm/s Final  . Left ICA dist sys 09/19/2016 -46  cm/s Final  . Left ICA dist dias 09/19/2016 -17  cm/s Final  . RIGHT VERTEBRAL DIAS 09/19/2016 7.00  cm/s Final  . LEFT ECA DIAS 09/19/2016 -7.00  cm/s Final  . LEFT VERTEBRAL DIAS 09/19/2016 9.00  cm/s Final  . Glucose-Capillary 09/19/2016 116* 65 - 99 mg/dL Final  . Troponin I 09/19/2016 <0.03  <0.03 ng/mL Final  . Troponin I 09/20/2016 <0.03  <0.03 ng/mL Final  . TSH 09/19/2016 0.430  0.350 - 4.500 uIU/mL Final   Performed by a 3rd Generation assay with a functional sensitivity of <=0.01 uIU/mL.  . Free T4 09/19/2016 0.90  0.61 - 1.12 ng/dL Final   Comment: (NOTE) Biotin ingestion may interfere with free T4 tests. If the results are inconsistent with the TSH level, previous test results, or the clinical presentation, then consider biotin interference. If needed, order repeat testing after stopping biotin. Performed at [facility], [place] [address] [place], [place] [address]   . Troponin I 09/19/2016 <0.03  <0.03 ng/mL Final  . Hgb A1c MFr Bld 09/20/2016 5.4  4.8 - 5.6 % Final   Comment: (NOTE) Pre diabetes:          5.7%-6.4% Diabetes:              >6.4% Glycemic control for   <7.0% adults with diabetes   . Mean Plasma Glucose 09/20/2016 108.28  mg/dL Final   Performed at [place] [address] [place], [place] [address]  . Cholesterol 09/20/2016 157  0 - 200 mg/dL Final  . Triglycerides 09/20/2016 91  <150 mg/dL Final  . HDL 09/20/2016 52  >40 mg/dL Final  . Total CHOL/HDL Ratio 09/20/2016 3.0  RATIO Final  . VLDL 09/20/2016 18  0 - 40 mg/dL Final  . LDL Cholesterol 09/20/2016 87  0 -  99 mg/dL Final   Comment:  Total Cholesterol/HDL:CHD Risk Coronary Heart Disease Risk Table                     Men   Women  1/2 Average Risk   3.4   3.3  Average Risk       5.0   4.4  2 X Average Risk   9.6   7.1  3 X Average Risk  23.4   11.0        Use the calculated Patient Ratio above and the CHD Risk Table to determine the patient's CHD Risk.        ATP III CLASSIFICATION (LDL):  <100     mg/dL   Optimal  100-129  mg/dL   Near or Above                    Optimal  130-159  mg/dL   Borderline  160-189  mg/dL   High  >190     mg/dL   Very High Performed at [place] [address] [place], [place] [address]   . Sodium 09/20/2016 142  135 - 145 mmol/L Final  . Potassium 09/20/2016 4.3  3.5 - 5.1 mmol/L Final  . Chloride 09/20/2016 108  101 - 111 mmol/L Final  . CO2 09/20/2016 28  22 - 32 mmol/L Final  . Glucose, Bld 09/20/2016 139* 65 - 99 mg/dL Final  . BUN 09/20/2016 13  6 - 20 mg/dL Final  . Creatinine, Ser 09/20/2016 0.67  0.44 - 1.00 mg/dL Final  . Calcium 09/20/2016 9.3  8.9 - 10.3 mg/dL Final  . GFR calc non Af Amer 09/20/2016 >60  >60 mL/min Final  . GFR calc Af Amer 09/20/2016 >60  >60 mL/min Final   Comment: (NOTE) The eGFR has been calculated using the CKD EPI equation. This calculation has not been validated in all clinical situations. eGFR's persistently <60 mL/min signify possible Chronic Kidney Disease.   . Anion gap 09/20/2016 6  5 - 15 Final  . WBC 09/20/2016 9.5  4.0 - 10.5 K/uL Final  . RBC 09/20/2016 4.25  3.87 - 5.11 MIL/uL Final  . Hemoglobin 09/20/2016 13.6  12.0 - 15.0 g/dL Final  . HCT 09/20/2016 40.4  36.0 - 46.0 % Final  . MCV 09/20/2016 95.1  78.0 - 100.0 fL Final  . MCH 09/20/2016 32.0  26.0 - 34.0 pg Final  . MCHC 09/20/2016 33.7  30.0 - 36.0 g/dL Final  . RDW 09/20/2016 12.3  11.5 - 15.5 % Final  . Platelets 09/20/2016 297  150 - 400 K/uL Final  . Magnesium 09/20/2016 1.9  1.7 - 2.4 mg/dL Final  . Weight 09/20/2016  2368  oz Final  . Height 09/20/2016 68  in Final  . BP 09/20/2016 139/70  mmHg Final  . Prothrombin Time 09/20/2016 13.5  11.4 - 15.2 seconds Final  . INR 09/20/2016 1.03   Final  . Glucose-Capillary 09/20/2016 124* 65 - 99 mg/dL Final    (this displays the last labs from the last 3 days)  No results found for: TOTALPROTELP, ALBUMINELP, A1GS, A2GS, BETS, BETA2SER, GAMS, MSPIKE, SPEI (this displays SPEP labs)  No results found for: KPAFRELGTCHN, LAMBDASER, KAPLAMBRATIO (kappa/lambda light chains)  No results found for: HGBA, HGBA2QUANT, HGBFQUANT, HGBSQUAN (Hemoglobinopathy evaluation)   No results found for: LDH  No results found for: IRON, TIBC, IRONPCTSAT (Iron and TIBC)  No results found for: FERRITIN  Urinalysis    Component Value Date/Time  COLORURINE AMBER (A) 09/18/2016 1659   APPEARANCEUR HAZY (A) 09/18/2016 1659   LABSPEC 1.027 09/18/2016 1659   PHURINE 5.0 09/18/2016 1659   GLUCOSEU NEGATIVE 09/18/2016 1659   HGBUR SMALL (A) 09/18/2016 1659   BILIRUBINUR NEGATIVE 09/18/2016 1659   KETONESUR 80 (A) 09/18/2016 1659   PROTEINUR 30 (A) 09/18/2016 1659   NITRITE NEGATIVE 09/18/2016 1659   LEUKOCYTESUR MODERATE (A) 09/18/2016 1659     STUDIES: Dg Chest 2 View  Result Date: 09/18/2016 CLINICAL DATA:  Generalized body aches, fever, chills, nausea and vomiting for 3 days. EXAM: CHEST  2 VIEW COMPARISON:  None. FINDINGS: There is cardiomegaly and vascular congestion. No consolidative process or pneumothorax. Trace bilateral pleural effusions noted. No acute bony abnormality. IMPRESSION: Cardiomegaly and pulmonary vascular congestion. Trace bilateral pleural effusions. Electronically Signed   By: Inge Rise M.D.   On: 09/18/2016 19:53   Ct Head Wo Contrast  Result Date: 09/18/2016 CLINICAL DATA:  Generalized body aches, fever, chills, nausea and vomiting for 3 days. RIGHT eye and ear pain. EXAM: CT HEAD WITHOUT CONTRAST TECHNIQUE: Contiguous axial images were  obtained from the base of the skull through the vertex without intravenous contrast. COMPARISON:  None. FINDINGS: BRAIN: 4.3 x 7.6 cm rounded hypodensity with heterogeneous component RIGHT prop occipital lobe effacing the RIGHT atrium. Surrounding apparent vasogenic edema. Local mass-effect, 3 mm RIGHT to LEFT midline shift. No intraparenchymal hemorrhage. No hydrocephalus. No abnormal extra-axial fluid collections. VASCULAR: Moderate calcific atherosclerosis of the carotid siphons. SKULL: No skull fracture. No significant scalp soft tissue swelling. SINUSES/ORBITS: Trace paranasal sinus mucosal thickening. Mastoid air cells are well aerated. The included ocular globes and orbital contents are non-suspicious. Status post bilateral ocular lens implants. OTHER: None. IMPRESSION: 1. Large RIGHT parieto-occipital masslike hypodensity, differential diagnosis includes tumor/metastasis, cerebritis or less likely subacute MCA/ posterior watershed territory infarct. 3 mm RIGHT to LEFT midline shift. Recommend MRI of the brain with and without contrast. Acute findings discussed with and reconfirmed by [facility] NANAVATI on 09/18/2016 at 10:20 pm. Electronically Signed   By: Elon Alas M.D.   On: 09/18/2016 22:23   Ct Chest W Contrast  Result Date: 09/19/2016 CLINICAL DATA:  Remote history of breast cancer with recent brain MRI demonstrating metastatic brain lesions. Restaging CT scans. EXAM: CT CHEST, ABDOMEN, AND PELVIS WITH CONTRAST TECHNIQUE: Multidetector CT imaging of the chest, abdomen and pelvis was performed following the standard protocol during bolus administration of intravenous contrast. CONTRAST:  14m ISOVUE-300 IOPAMIDOL (ISOVUE-300) INJECTION 61% COMPARISON:  None. FINDINGS: CT CHEST FINDINGS Cardiovascular: The heart is normal in size. No pericardial effusion. Mild tortuosity and calcification of the thoracic aorta but no aneurysm or dissection. The branch vessels are patent. Scattered coronary  artery calcifications are noted. Mediastinum/Nodes: Necrotic appearing mediastinal lymphadenopathy. 3.3 x 2.1 cm nodal mass in the aorticopulmonary window on image number 25. There is also cystic/necrotic subcarinal lymphadenopathy with maximum short axis diameter of 25 mm on image number 33. Other scattered smaller lymph nodes are noted including a 9 mm right infrahilar lymph node on image number 38. The esophagus is grossly normal. Lungs/Pleura: Diffuse pulmonary metastatic disease with numerous bilateral pulmonary nodules. Index lesion in the right middle lobe on image number 115 measures 12 x 11 mm. Left upper lobe nodule on image number 85 measures 7 x 5 mm. Right lower lobe lesion just above the right hemidiaphragm on image number 124 measures 14.5 x 12.5 mm. Large necrotic lesion in the as ago esophageal recess could be a necrotic  metastatic lung lesion or a necrotic lymph node. It measures 4.2 by 3.0 cm. Small bilateral pleural effusions and bibasilar atelectasis. Chest wall/ Musculoskeletal: Surgical clips surrounding a cystic area in the right breast, likely small liquified hematoma related to a prior lumpectomy. No obvious breast masses. No axillary lymphadenopathy. No supraclavicular lymphadenopathy. The thyroid gland appears normal. No definite metastatic bone disease. There is a small sclerotic lesion noted in the T1 vertebral body and also in the T3 vertebral body. I do not however see any other definite lesions to suggest metastatic disease. CT ABDOMEN PELVIS FINDINGS Hepatobiliary: No definite findings for hepatic metastatic disease. Layering high attenuation material in the gallbladder could be sludge or stones. No findings for acute cholecystitis. Pancreas: No mass, inflammation or ductal dilatation. Spleen: Normal size.  No focal lesions. Adrenals/Urinary Tract: The adrenal glands are unremarkable. Somewhat irregular appearing left upper pole cystic lesion measures less than 20 Hounsfield units  and is most likely a cyst. No worrisome renal lesions or hydronephrosis. Stomach/Bowel: The stomach, duodenum, small bowel and colon are grossly normal. No acute inflammatory process, mass lesions or obstructive findings. Colonic diverticulosis without findings for acute diverticulitis. Vascular/Lymphatic: Moderate atherosclerotic calcifications involving a tortuous abdominal aorta but no focal aneurysm or dissection. The branch vessels are patent. The major venous structures are patent. 11.5 mm gastrohepatic ligament lymph node is noted on image number 60. There is also a low-attenuation retrocrural lymph node measures 11 mm on image number 59. No mesenteric or retroperitoneal mass or lymphadenopathy. Reproductive: The enlarged fibroid uterus. The ovaries appear normal. Other: No pelvic lymphadenopathy. There are enlarged right inguinal lymph nodes. Suspect postoperative changes in the right inguinal area. Musculoskeletal: No obvious metastatic bone disease. IMPRESSION: 1. Pulmonary metastatic disease and fairly extensive necrotic appearing mediastinal and hilar lymphadenopathy. 2. No obvious breast masses and no supraclavicular or axillary adenopathy. 3. No obvious abdominal/pelvic metastatic disease or osseous metastatic disease. A few small upper thoracic sclerotic bone lesions require observation. 4. Enlarged fibroid uterus. 5. Enlarged right inguinal lymph nodes and possible prior postoperative changes. Electronically Signed   By: Marijo Sanes M.D.   On: 09/19/2016 14:45   Mr Jeri Cos EN Contrast  Result Date: 09/19/2016 CLINICAL DATA:  Abnormal head CT. Headaches. History breast cancer. EXAM: MRI HEAD WITHOUT AND WITH CONTRAST TECHNIQUE: Multiplanar, multiecho pulse sequences of the brain and surrounding structures were obtained without and with intravenous contrast. CONTRAST:  36m MULTIHANCE GADOBENATE DIMEGLUMINE 529 MG/ML IV SOLN COMPARISON:  Head CT from yesterday FINDINGS: Brain: Large  heterogeneously enhancing mass in the posterior right cerebrum, spanning the parietal and occipital lobes and measuring up to 6 x 5 x 6.4 cm. This mass contains scattered cystic spaces and probable blood products. The overlying cortex is not visible and there is a thin line of overlying dural thickening. There is local mass effect, mild for the size of tumor. Mild vasogenic edema. 10 mm left cerebellar and 3 mm left parietal cortex enhancing masses, overall pattern suggesting metastatic disease. Punctate areas of cortical diffusion hyperintensity in the left occipital pole consistent with small infarcts. Mild restricted diffusion seen in the right cerebellum, suggesting subacute ischemia. Diffusion within the right-sided mass from blood products or other paramagnetic substance. There is scattered FLAIR hyperintensities in the cerebral white matter consistent with chronic small vessel ischemia. Vascular: Major flow voids are preserved. Skull and upper cervical spine: No aggressive marrow lesion. Right parietal bone lesion has intrinsic T1 hyperintensity most consistent with hemangioma. Sinuses/Orbits: Bilateral cataract resection. Mild  chronic sinusitis. IMPRESSION: 1. Metastatic pattern with 3 brain masses. The largest is in the posterior right cerebrum measuring up to 6.4 cm; local mass effect. The other lesions measure 1 cm in the left cerebellum and 3 mm in the left parietal cortex. 2. 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 Subacute ischemia in the upper right cerebellum. Electronically Signed   By: Monte Fantasia M.D.   On: 09/19/2016 07:49   Ct Abdomen Pelvis W Contrast  Result Date: 09/19/2016 CLINICAL DATA:  Remote history of breast cancer with recent brain MRI demonstrating metastatic brain lesions. Restaging CT scans. EXAM: CT CHEST, ABDOMEN, AND PELVIS WITH CONTRAST TECHNIQUE: Multidetector CT imaging of the chest, abdomen and pelvis was performed following the standard protocol  during bolus administration of intravenous contrast. CONTRAST:  133m ISOVUE-300 IOPAMIDOL (ISOVUE-300) INJECTION 61% COMPARISON:  None. FINDINGS: CT CHEST FINDINGS Cardiovascular: The heart is normal in size. No pericardial effusion. Mild tortuosity and calcification of the thoracic aorta but no aneurysm or dissection. The branch vessels are patent. Scattered coronary artery calcifications are noted. Mediastinum/Nodes: Necrotic appearing mediastinal lymphadenopathy. 3.3 x 2.1 cm nodal mass in the aorticopulmonary window on image number 25. There is also cystic/necrotic subcarinal lymphadenopathy with maximum short axis diameter of 25 mm on image number 33. Other scattered smaller lymph nodes are noted including a 9 mm right infrahilar lymph node on image number 38. The esophagus is grossly normal. Lungs/Pleura: Diffuse pulmonary metastatic disease with numerous bilateral pulmonary nodules. Index lesion in the right middle lobe on image number 115 measures 12 x 11 mm. Left upper lobe nodule on image number 85 measures 7 x 5 mm. Right lower lobe lesion just above the right hemidiaphragm on image number 124 measures 14.5 x 12.5 mm. Large necrotic lesion in the as ago esophageal recess could be a necrotic metastatic lung lesion or a necrotic lymph node. It measures 4.2 by 3.0 cm. Small bilateral pleural effusions and bibasilar atelectasis. Chest wall/ Musculoskeletal: Surgical clips surrounding a cystic area in the right breast, likely small liquified hematoma related to a prior lumpectomy. No obvious breast masses. No axillary lymphadenopathy. No supraclavicular lymphadenopathy. The thyroid gland appears normal. No definite metastatic bone disease. There is a small sclerotic lesion noted in the T1 vertebral body and also in the T3 vertebral body. I do not however see any other definite lesions to suggest metastatic disease. CT ABDOMEN PELVIS FINDINGS Hepatobiliary: No definite findings for hepatic metastatic disease.  Layering high attenuation material in the gallbladder could be sludge or stones. No findings for acute cholecystitis. Pancreas: No mass, inflammation or ductal dilatation. Spleen: Normal size.  No focal lesions. Adrenals/Urinary Tract: The adrenal glands are unremarkable. Somewhat irregular appearing left upper pole cystic lesion measures less than 20 Hounsfield units and is most likely a cyst. No worrisome renal lesions or hydronephrosis. Stomach/Bowel: The stomach, duodenum, small bowel and colon are grossly normal. No acute inflammatory process, mass lesions or obstructive findings. Colonic diverticulosis without findings for acute diverticulitis. Vascular/Lymphatic: Moderate atherosclerotic calcifications involving a tortuous abdominal aorta but no focal aneurysm or dissection. The branch vessels are patent. The major venous structures are patent. 11.5 mm gastrohepatic ligament lymph node is noted on image number 60. There is also a low-attenuation retrocrural lymph node measures 11 mm on image number 59. No mesenteric or retroperitoneal mass or lymphadenopathy. Reproductive: The enlarged fibroid uterus. The ovaries appear normal. Other: No pelvic lymphadenopathy. There are enlarged right inguinal lymph nodes. Suspect postoperative changes in  the right inguinal area. Musculoskeletal: No obvious metastatic bone disease. IMPRESSION: 1. Pulmonary metastatic disease and fairly extensive necrotic appearing mediastinal and hilar lymphadenopathy. 2. No obvious breast masses and no supraclavicular or axillary adenopathy. 3. No obvious abdominal/pelvic metastatic disease or osseous metastatic disease. A few small upper thoracic sclerotic bone lesions require observation. 4. Enlarged fibroid uterus. 5. Enlarged right inguinal lymph nodes and possible prior postoperative changes. Electronically Signed   By: Marijo Sanes M.D.   On: 09/19/2016 14:45   US Biopsy  Result Date: 09/20/2016 INDICATION: METASTATIC DISEASE,  BRAIN METASTASES, INGUINAL ADENOPATHY, UNKNOWN PRIMARY EXAM: ULTRASOUND RIGHT INGUINAL ADENOPATHY 18 GAUGE CORE BIOPSY MEDICATIONS: 1% lidocaine local ANESTHESIA/SEDATION: Moderate (conscious) sedation was employed during this procedure. A total of Versed 2.0 mg and Fentanyl 100 mcg was administered intravenously. Moderate Sedation Time: 10 Minutes. The patient's level of consciousness and vital signs were monitored continuously by radiology nursing throughout the procedure under my direct supervision. FLUOROSCOPY TIME:  Fluoroscopy Time: None. COMPLICATIONS: None immediate. PROCEDURE: Informed written consent was obtained from the patient after a thorough discussion of the procedural risks, benefits and alternatives. All questions were addressed. Maximal Sterile Barrier Technique was utilized including caps, mask, sterile gowns, sterile gloves, sterile drape, hand hygiene and skin antiseptic. A timeout was performed prior to the initiation of the procedure. Previous imaging reviewed. Preliminary ultrasound performed. Right inguinal abnormal adenopathy localized. Overlying skin marked. Under sterile conditions and local anesthesia, an 18 gauge core biopsy medial was advanced to the right inguinal abnormal adenopathy. 5 18 gauge core biopsies obtained. Samples placed in saline. Needle removed. Postprocedure imaging demonstrates no hemorrhage or hematoma. Patient tolerated the biopsy well. No Complication. IMPRESSION: Successful ultrasound right inguinal adenopathy 18 gauge core biopsies Electronically Signed   By: Jerilynn Mages.  Shick M.D.   On: 09/20/2016 12:50    ELIGIBLE FOR AVAILABLE RESEARCH PROTOCOL: no  ASSESSMENT: 76 y.o. [place] woman presenting with headaches, dizziness, and vomiting 09/18/2016, initial noncontrasted CT showing a large right parieto-occipital hypodensity, status post inguinal node biopsy 09/20/2016 with results pending  (1) status post remote right lumpectomy in [place],  followed by radiation; no chemotherapy or anti-estrogens  METASTATIC DISEASE: August 2018 (2) brain MRI 09/19/2016 shows 3 brain masses the largest measuring 6.4 cm, involving right cerebrum, cerebellum, and left parietal cortex; CT of the chest abdomen and pelvis 09/19/2016 shows multiple bilateral pulmonary nodules, small bilateral effusions, mediastinal, subcarinal and aortopulmonary window adenopathy, no liver or bone involvement  (a) right inguinal lymph node core biopsy 09/20/2016, results pending  PLAN: I spent approximately one hour with the patient and today some time with her family going over her situation. Given her prior history of breast cancer and the lack of another obvious primary, most likely we are dealing with metastatic breast cancer.  If this is metastatic breast cancer, unfortunately with our knowledge based today this is not curable. However in many cases it can be treatable and survival is very variable. Much will depend on whether the tumor is estrogen receptor and/or progesterone receptor positive and or HER-2 amplified. This is of course assuming we are indeed dealing with breast cancer.  We are hoping the inguinal lymph node biopsy will give [place] the answer, but the patient apparently has had some surgery to that area in the past and this may be chronic and unrelated.  Her case will be presented at the brain tumor conference 09/23/2016. If craniotomy is felt to be a good option for her, that would provide  [place] the necessary tissue. Otherwise she may need biopsy of one of the lung lesions.  Today we specifically discussed the fact that the treatment of metastatic breast cancer with brain involvement is really 2 separate treatments. For the brain we use radiation and surgery. For the rest of the body we do systemic therapy. If her tumor proves to be estrogen receptor positive then anti-estrogens are preferred. However since she did not receive antiestrogen's with her original  lumpectomy presumably the tumor was estrogen receptor negative. In that case chemotherapy would be the only option unless the tumor proves to be HER-2 positive.  I will follow until we have a definitive diagnosis and should be able to advise the patient regarding specific systemic treatment options at that point.   Chauncey Cruel, MD   09/20/2016 5:42 PM Medical Oncology and Hematology [facility] [address] [place], [place] [address] Tel. [phone]    Fax. [phone]

## 2016-09-20 NOTE — Evaluation (Signed)
Speech Language Pathology Evaluation Patient Details Name: Deanna Washington MRN: [id number] DOB: November 10, 1940 Today's Date: 09/20/2016 Time: 2119-4174 SLP Time Calculation (min) (ACUTE ONLY): 49 min  Problem List:  Patient Active Problem List   Diagnosis Date Noted  . Brain tumor ([place]) 09/19/2016  . Asthma 09/19/2016  . Essential hypertension 09/19/2016  . Acute embolic stroke ([place]) 09/10/4816  . Atrial fibrillation, new onset ([place]) 09/19/2016  . Sinus bradycardia 09/19/2016  . Metastatic cancer ([place]) 09/19/2016  . Bradycardia   . Tachy-brady syndrome [facility])    Past Medical History:  Past Medical History:  Diagnosis Date  . Asthma   . Breast cancer ([place])   . HTN (hypertension)    Past Surgical History:  Past Surgical History:  Procedure Laterality Date  . BREAST LUMPECTOMY     HPI:  76 year old female with history of breast cancer status post lumpectomy and radiation 8 years ago who has been in remission and asthma who presented with progressive headache, nausea and vomiting. CT of the head demonstrated a large right parieto-occipital mass like hypodensity insistent with a tumor or metastases with 3 mm of right to left midline shift.  MRI confirmed a 6 x 5 x 6.4 cm heterogeneous enhancing mass in the posterior right cerebrum spinning but the parietal and occipital lobes with scattered cystic spaces and blood products. There are also 2 additional masses a 10 mm left cerebellar mass and a 3 mm left parietal cortex mass suggestive of metastatic disease. In addition, she had punctate areas of cortical diffusion hyperintensity in the left occipital lobe consistent with small acute infarcts with a subacute ischemia in the upper right cerebellum.  Dr. 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  CT of the chest/abdomen and pelvis demonstrates numerous lung nodules and mediastinal/hilar lymphadenopathy  suggestive of possible prior lung CA vs. Recurrence of her breast cancer.  ST, OT and PT ordered as part of stroke work up.     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  Pt strengths included naming, attention,orientation to month, year, diagnosis and place.  Pt demonstrated difficulties with clock drawing, trail task, fluency, sequential mental subtraction and delayed recall.  Some word finding deficits apparent during session for which pt attempts to compensate.  Pt demonstrates decreased awareness to her deficits stating "I have a hard time with numbers" and "I don't like to pay bills" when incorrectly paying power bill.  Daughter [name] present and admits to pt having cognitive deficits prior to admit but states she thought it was just due to lack of sleep as pt cares for husband.  SLP will follow up briefly to assess further and provide compensation strategies to pt/family.      SLP Assessment  SLP Recommendation/Assessment: Patient needs continued Speech Lanaguage Pathology Services SLP Visit Diagnosis: Attention and concentration deficit;Cognitive communication deficit (R41.841);Aphasia (R47.01) Attention and concentration deficit following: Cerebral infarction (multiple brain masses)    Follow Up Recommendations   (tbd)    Frequency and Duration min 1 x/week  1 week      SLP Evaluation Cognition  Overall Cognitive Status: Impaired/Different from baseline Arousal/Alertness: Awake/alert Orientation Level: Oriented to person;Oriented to place;Oriented to situation (month and year oriented) Attention: Sustained Sustained Attention: Appears intact Memory: Impaired Memory Impairment: Storage deficit;Retrieval deficit Awareness: Impaired Awareness  Impairment: Intellectual impairment Problem Solving: Impaired Safety/Judgment: Impaired        Comprehension  Auditory Comprehension Overall Auditory Comprehension: Appears within functional limits for tasks assessed Yes/No Questions: Not tested Commands: Within Functional Limits Conversation: Complex Other Conversation Comments: occasional word finding deficit with pt awareness Interfering Components:  (? visual impairment, involvement of occipital lobe) EffectiveTechniques: Extra processing time;Repetition Visual Recognition/Discrimination Discrimination: Not tested Reading Comprehension Reading Status: Within funtional limits (for calendar in room, did not test further)    Expression Expression Primary Mode of Expression: Verbal Verbal Expression Overall Verbal Expression: Impaired Initiation: No impairment Repetition: Impaired Level of Impairment: Sentence level Naming: No impairment (3/3 items, self corrected on 1) Pragmatics: No impairment Written Expression Dominant Hand: Right Written Expression:  (clock drawing difficult, able to draw circle but numbers not placed correctly and unable to document correct time)   Oral / Motor  Oral Motor/Sensory Function Overall Oral Motor/Sensory Function: Within functional limits Motor Speech Overall Motor Speech: Appears within functional limits for tasks assessed Respiration: Within functional limits Phonation: Normal Resonance: Within functional limits Articulation: Within functional limitis Intelligibility: Intelligible Motor Planning: Witnin functional limits Motor Speech Errors: Not applicable   GO            Luanna Salk, [place] [facility] SLP [phone]         Macario Golds 09/20/2016, 12:44 PM

## 2016-09-20 NOTE — Progress Notes (Signed)
Progress Note  Patient Name: Deanna Washington Date of Encounter: 09/20/2016  Primary Cardiologist: Dr. Oval Linsey (new)  Subjective   Feeling well.  She had a little dizziness with walking with PT.    Inpatient Medications    Scheduled Meds: . amiodarone  200 mg Oral BID  . aspirin  300 mg Rectal Daily   Or  . aspirin  325 mg Oral Daily  . dexamethasone  10 mg Intravenous Q6H  . enoxaparin (LOVENOX) injection  40 mg Subcutaneous Q24H  . losartan  50 mg Oral Daily   And  . hydrochlorothiazide  12.5 mg Oral Daily  . levETIRAcetam  500 mg Oral BID  . mometasone-formoterol  2 puff Inhalation BID  . pantoprazole (PROTONIX) IV  40 mg Intravenous QHS   Continuous Infusions: . sodium chloride 75 mL/hr at 09/20/16 0600   PRN Meds: acetaminophen, albuterol, diphenhydrAMINE, iopamidol, morphine injection, ondansetron (ZOFRAN) IV   Vital Signs    Vitals:   09/20/16 0600 09/20/16 0700 09/20/16 0800 09/20/16 0921  BP: (!) 125/59     Pulse: (!) 38 (!) 43    Resp: 16 15    Temp:   98 F (36.7 C)   TempSrc:   Oral   SpO2: 94% 94%  94%  Weight:      Height:        Intake/Output Summary (Last 24 hours) at 09/20/16 1036 Last data filed at 09/20/16 0900  Gross per 24 hour  Intake             1500 ml  Output             1150 ml  Net              350 ml   Filed Weights   09/18/16 1546  Weight: 67.1 kg (148 lb)    Telemetry    Sinus bradycardia.  Rate 30s-50s.  Short run of atrial fibrillation.  - Personally Reviewed  ECG    N/a  - Personally Reviewed  Physical Exam   VS:  BP (!) 125/59   Pulse (!) 43   Temp 98 F (36.7 C) (Oral)   Resp 15   Ht 5\' 8"  (1.727 m)   Wt 67.1 kg (148 lb)   SpO2 94%   BMI 22.50 kg/m  , BMI Body mass index is 22.5 kg/m. GENERAL:  Well appearing.  No acute distress.  HEENT: Pupils equal round and reactive, fundi not visualized, oral mucosa unremarkable NECK:  No jugular venous distention, waveform within normal limits, carotid  upstroke brisk and symmetric, no bruits LUNGS:  Clear to auscultation bilaterally HEART:  Bradycardic.  Regular rhythm.   PMI not displaced or sustained,S1 and S2 within normal limits, no S3, no S4, no clicks, no rubs, no murmurs ABD:  Flat, positive bowel sounds normal in frequency in pitch, no bruits, no rebound, no guarding, no midline pulsatile mass, no hepatomegaly, no splenomegaly EXT:  2 plus pulses throughout, no edema, no cyanosis no clubbing SKIN:  No rashes no nodules NEURO:  Cranial nerves II through XII grossly intact, motor grossly intact throughout PSYCH:  Cognitively intact, oriented to person place and time   Labs    Chemistry Recent Labs Lab 09/18/16 1659 09/19/16 0319 09/20/16 0253  NA 138 138 142  K 3.6 3.8 4.3  CL 103 105 108  CO2 26 27 28   GLUCOSE 112* 126* 139*  BUN 12 12 13   CREATININE 0.74 0.78 0.67  CALCIUM 9.6 8.9  9.3  PROT  --  6.3*  --   ALBUMIN  --  3.7  --   AST  --  21  --   ALT  --  18  --   ALKPHOS  --  61  --   BILITOT  --  0.6  --   GFRNONAA >60 >60 >60  GFRAA >60 >60 >60  ANIONGAP 9 6 6      Hematology Recent Labs Lab 09/18/16 1659 09/19/16 0319 09/20/16 0253  WBC 10.8* 11.1* 9.5  RBC 4.63 4.23 4.25  HGB 14.9 13.7 13.6  HCT 43.3 40.6 40.4  MCV 93.5 96.0 95.1  MCH 32.2 32.4 32.0  MCHC 34.4 33.7 33.7  RDW 12.1 12.4 12.3  PLT 286 261 297    Cardiac Enzymes Recent Labs Lab 09/19/16 1710 09/19/16 2302 09/20/16 0253  TROPONINI <0.03 <0.03 <0.03    Recent Labs Lab 09/18/16 1727  TROPIPOC 0.00     BNPNo results for input(s): BNP, PROBNP in the last 168 hours.   DDimer No results for input(s): DDIMER in the last 168 hours.   Radiology    Dg Chest 2 View  Result Date: 09/18/2016 CLINICAL DATA:  Generalized body aches, fever, chills, nausea and vomiting for 3 days. EXAM: CHEST  2 VIEW COMPARISON:  None. FINDINGS: There is cardiomegaly and vascular congestion. No consolidative process or pneumothorax. Trace bilateral  pleural effusions noted. No acute bony abnormality. IMPRESSION: Cardiomegaly and pulmonary vascular congestion. Trace bilateral pleural effusions. Electronically Signed   By: Inge Rise M.D.   On: 09/18/2016 19:53   Ct Head Wo Contrast  Result Date: 09/18/2016 CLINICAL DATA:  Generalized body aches, fever, chills, nausea and vomiting for 3 days. RIGHT eye and ear pain. EXAM: CT HEAD WITHOUT CONTRAST TECHNIQUE: Contiguous axial images were obtained from the base of the skull through the vertex without intravenous contrast. COMPARISON:  None. FINDINGS: BRAIN: 4.3 x 7.6 cm rounded hypodensity with heterogeneous component RIGHT prop occipital lobe effacing the RIGHT atrium. Surrounding apparent vasogenic edema. Local mass-effect, 3 mm RIGHT to LEFT midline shift. No intraparenchymal hemorrhage. No hydrocephalus. No abnormal extra-axial fluid collections. VASCULAR: Moderate calcific atherosclerosis of the carotid siphons. SKULL: No skull fracture. No significant scalp soft tissue swelling. SINUSES/ORBITS: Trace paranasal sinus mucosal thickening. Mastoid air cells are well aerated. The included ocular globes and orbital contents are non-suspicious. Status post bilateral ocular lens implants. OTHER: None. IMPRESSION: 1. Large RIGHT parieto-occipital masslike hypodensity, differential diagnosis includes tumor/metastasis, cerebritis or less likely subacute MCA/ posterior watershed territory infarct. 3 mm RIGHT to LEFT midline shift. Recommend MRI of the brain with and without contrast. Acute findings discussed with and reconfirmed by [facility] NANAVATI on 09/18/2016 at 10:20 pm. Electronically Signed   By: Elon Alas M.D.   On: 09/18/2016 22:23   Ct Chest W Contrast  Result Date: 09/19/2016 CLINICAL DATA:  Remote history of breast cancer with recent brain MRI demonstrating metastatic brain lesions. Restaging CT scans. EXAM: CT CHEST, ABDOMEN, AND PELVIS WITH CONTRAST TECHNIQUE: Multidetector CT imaging of  the chest, abdomen and pelvis was performed following the standard protocol during bolus administration of intravenous contrast. CONTRAST:  127mL ISOVUE-300 IOPAMIDOL (ISOVUE-300) INJECTION 61% COMPARISON:  None. FINDINGS: CT CHEST FINDINGS Cardiovascular: The heart is normal in size. No pericardial effusion. Mild tortuosity and calcification of the thoracic aorta but no aneurysm or dissection. The branch vessels are patent. Scattered coronary artery calcifications are noted. Mediastinum/Nodes: Necrotic appearing mediastinal lymphadenopathy. 3.3 x 2.1 cm nodal mass in the  aorticopulmonary window on image number 25. There is also cystic/necrotic subcarinal lymphadenopathy with maximum short axis diameter of 25 mm on image number 33. Other scattered smaller lymph nodes are noted including a 9 mm right infrahilar lymph node on image number 38. The esophagus is grossly normal. Lungs/Pleura: Diffuse pulmonary metastatic disease with numerous bilateral pulmonary nodules. Index lesion in the right middle lobe on image number 115 measures 12 x 11 mm. Left upper lobe nodule on image number 85 measures 7 x 5 mm. Right lower lobe lesion just above the right hemidiaphragm on image number 124 measures 14.5 x 12.5 mm. Large necrotic lesion in the as ago esophageal recess could be a necrotic metastatic lung lesion or a necrotic lymph node. It measures 4.2 by 3.0 cm. Small bilateral pleural effusions and bibasilar atelectasis. Chest wall/ Musculoskeletal: Surgical clips surrounding a cystic area in the right breast, likely small liquified hematoma related to a prior lumpectomy. No obvious breast masses. No axillary lymphadenopathy. No supraclavicular lymphadenopathy. The thyroid gland appears normal. No definite metastatic bone disease. There is a small sclerotic lesion noted in the T1 vertebral body and also in the T3 vertebral body. I do not however see any other definite lesions to suggest metastatic disease. CT ABDOMEN PELVIS  FINDINGS Hepatobiliary: No definite findings for hepatic metastatic disease. Layering high attenuation material in the gallbladder could be sludge or stones. No findings for acute cholecystitis. Pancreas: No mass, inflammation or ductal dilatation. Spleen: Normal size.  No focal lesions. Adrenals/Urinary Tract: The adrenal glands are unremarkable. Somewhat irregular appearing left upper pole cystic lesion measures less than 20 Hounsfield units and is most likely a cyst. No worrisome renal lesions or hydronephrosis. Stomach/Bowel: The stomach, duodenum, small bowel and colon are grossly normal. No acute inflammatory process, mass lesions or obstructive findings. Colonic diverticulosis without findings for acute diverticulitis. Vascular/Lymphatic: Moderate atherosclerotic calcifications involving a tortuous abdominal aorta but no focal aneurysm or dissection. The branch vessels are patent. The major venous structures are patent. 11.5 mm gastrohepatic ligament lymph node is noted on image number 60. There is also a low-attenuation retrocrural lymph node measures 11 mm on image number 59. No mesenteric or retroperitoneal mass or lymphadenopathy. Reproductive: The enlarged fibroid uterus. The ovaries appear normal. Other: No pelvic lymphadenopathy. There are enlarged right inguinal lymph nodes. Suspect postoperative changes in the right inguinal area. Musculoskeletal: No obvious metastatic bone disease. IMPRESSION: 1. Pulmonary metastatic disease and fairly extensive necrotic appearing mediastinal and hilar lymphadenopathy. 2. No obvious breast masses and no supraclavicular or axillary adenopathy. 3. No obvious abdominal/pelvic metastatic disease or osseous metastatic disease. A few small upper thoracic sclerotic bone lesions require observation. 4. Enlarged fibroid uterus. 5. Enlarged right inguinal lymph nodes and possible prior postoperative changes. Electronically Signed   By: Marijo Sanes M.D.   On: 09/19/2016  14:45   Mr Jeri Cos WJ Contrast  Result Date: 09/19/2016 CLINICAL DATA:  Abnormal head CT. Headaches. History breast cancer. EXAM: MRI HEAD WITHOUT AND WITH CONTRAST TECHNIQUE: Multiplanar, multiecho pulse sequences of the brain and surrounding structures were obtained without and with intravenous contrast. CONTRAST:  82mL MULTIHANCE GADOBENATE DIMEGLUMINE 529 MG/ML IV SOLN COMPARISON:  Head CT from yesterday FINDINGS: Brain: Large heterogeneously enhancing mass in the posterior right cerebrum, spanning the parietal and occipital lobes and measuring up to 6 x 5 x 6.4 cm. This mass contains scattered cystic spaces and probable blood products. The overlying cortex is not visible and there is a thin line of overlying dural  thickening. There is local mass effect, mild for the size of tumor. Mild vasogenic edema. 10 mm left cerebellar and 3 mm left parietal cortex enhancing masses, overall pattern suggesting metastatic disease. Punctate areas of cortical diffusion hyperintensity in the left occipital pole consistent with small infarcts. Mild restricted diffusion seen in the right cerebellum, suggesting subacute ischemia. Diffusion within the right-sided mass from blood products or other paramagnetic substance. There is scattered FLAIR hyperintensities in the cerebral white matter consistent with chronic small vessel ischemia. Vascular: Major flow voids are preserved. Skull and upper cervical spine: No aggressive marrow lesion. Right parietal bone lesion has intrinsic T1 hyperintensity most consistent with hemangioma. Sinuses/Orbits: Bilateral cataract resection. Mild chronic sinusitis. IMPRESSION: 1. Metastatic pattern with 3 brain masses. The largest is in the posterior right cerebrum measuring up to 6.4 cm; local mass effect. The other lesions measure 1 cm in the left cerebellum and 3 mm in the left parietal cortex. 2. 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 Subacute ischemia in the upper right  cerebellum. Electronically Signed   By: Monte Fantasia M.D.   On: 09/19/2016 07:49   Ct Abdomen Pelvis W Contrast  Result Date: 09/19/2016 CLINICAL DATA:  Remote history of breast cancer with recent brain MRI demonstrating metastatic brain lesions. Restaging CT scans. EXAM: CT CHEST, ABDOMEN, AND PELVIS WITH CONTRAST TECHNIQUE: Multidetector CT imaging of the chest, abdomen and pelvis was performed following the standard protocol during bolus administration of intravenous contrast. CONTRAST:  145mL ISOVUE-300 IOPAMIDOL (ISOVUE-300) INJECTION 61% COMPARISON:  None. FINDINGS: CT CHEST FINDINGS Cardiovascular: The heart is normal in size. No pericardial effusion. Mild tortuosity and calcification of the thoracic aorta but no aneurysm or dissection. The branch vessels are patent. Scattered coronary artery calcifications are noted. Mediastinum/Nodes: Necrotic appearing mediastinal lymphadenopathy. 3.3 x 2.1 cm nodal mass in the aorticopulmonary window on image number 25. There is also cystic/necrotic subcarinal lymphadenopathy with maximum short axis diameter of 25 mm on image number 33. Other scattered smaller lymph nodes are noted including a 9 mm right infrahilar lymph node on image number 38. The esophagus is grossly normal. Lungs/Pleura: Diffuse pulmonary metastatic disease with numerous bilateral pulmonary nodules. Index lesion in the right middle lobe on image number 115 measures 12 x 11 mm. Left upper lobe nodule on image number 85 measures 7 x 5 mm. Right lower lobe lesion just above the right hemidiaphragm on image number 124 measures 14.5 x 12.5 mm. Large necrotic lesion in the as ago esophageal recess could be a necrotic metastatic lung lesion or a necrotic lymph node. It measures 4.2 by 3.0 cm. Small bilateral pleural effusions and bibasilar atelectasis. Chest wall/ Musculoskeletal: Surgical clips surrounding a cystic area in the right breast, likely small liquified hematoma related to a prior  lumpectomy. No obvious breast masses. No axillary lymphadenopathy. No supraclavicular lymphadenopathy. The thyroid gland appears normal. No definite metastatic bone disease. There is a small sclerotic lesion noted in the T1 vertebral body and also in the T3 vertebral body. I do not however see any other definite lesions to suggest metastatic disease. CT ABDOMEN PELVIS FINDINGS Hepatobiliary: No definite findings for hepatic metastatic disease. Layering high attenuation material in the gallbladder could be sludge or stones. No findings for acute cholecystitis. Pancreas: No mass, inflammation or ductal dilatation. Spleen: Normal size.  No focal lesions. Adrenals/Urinary Tract: The adrenal glands are unremarkable. Somewhat irregular appearing left upper pole cystic lesion measures less than 20 Hounsfield units and is most likely a  cyst. No worrisome renal lesions or hydronephrosis. Stomach/Bowel: The stomach, duodenum, small bowel and colon are grossly normal. No acute inflammatory process, mass lesions or obstructive findings. Colonic diverticulosis without findings for acute diverticulitis. Vascular/Lymphatic: Moderate atherosclerotic calcifications involving a tortuous abdominal aorta but no focal aneurysm or dissection. The branch vessels are patent. The major venous structures are patent. 11.5 mm gastrohepatic ligament lymph node is noted on image number 60. There is also a low-attenuation retrocrural lymph node measures 11 mm on image number 59. No mesenteric or retroperitoneal mass or lymphadenopathy. Reproductive: The enlarged fibroid uterus. The ovaries appear normal. Other: No pelvic lymphadenopathy. There are enlarged right inguinal lymph nodes. Suspect postoperative changes in the right inguinal area. Musculoskeletal: No obvious metastatic bone disease. IMPRESSION: 1. Pulmonary metastatic disease and fairly extensive necrotic appearing mediastinal and hilar lymphadenopathy. 2. No obvious breast masses and  no supraclavicular or axillary adenopathy. 3. No obvious abdominal/pelvic metastatic disease or osseous metastatic disease. A few small upper thoracic sclerotic bone lesions require observation. 4. Enlarged fibroid uterus. 5. Enlarged right inguinal lymph nodes and possible prior postoperative changes. Electronically Signed   By: Marijo Sanes M.D.   On: 09/19/2016 14:45    Cardiac Studies   Echo pending   Patient Profile     76 y.o. 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   Assessment & Plan    # Atrial fibrillation/flutter:   # Tachy/brady syndrome: Ms. Irion has been mostly in sinus bradycardia with rates in the 30s-50s.  She has a very short run of atrial fibrillation but no further sustained events since 8/23 in the afternoon.  Rate control is difficult given her baseline bradycardia.  She reported dizziness when ambulating with PT.  We will ask EP to see her for consideration of PPM.  Continue amiodarone 200mg  bid.  Plan for a slower load given her bradycardia.  Given her metastatic disease and emboli, she likely isn't a good candidate for anticoagulation.  Will defer to Neuro, Oncology and the primary team.  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  Echo pending.  Would add bubble study.  TEE is only helpful if she is a candidate for anticoagulation.    # Metastatic cancer: Plan is for possible resection of the large R occipital lesion with subsequent XRT.   Signed, Skeet Latch, MD  09/20/2016, 10:36 AM

## 2016-09-20 NOTE — Evaluation (Signed)
Physical Therapy Evaluation Patient Details Name: Deanna Washington MRN: [id number] DOB: [birth year] Today's Date: 09/20/2016   History of Present Illness  Pt. is a 76 y.o. female with PMHx of breast cancer status post lumpectomy and radiation 8 years ago who has been in remission and asthma who presented to the hospital with progressive headache, nausea and vomiting. CT of the head demonstrated a large right parieto-occipital mass with 3 mm of right to left midline shift. MRI confirmed a 6 x 5 x 6.4 cm heterogeneous enhancing mass in the posterior right cerebrum. 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  Clinical Impression  Pt admitted with the above conditions. Pt currently with functional limitations due to the deficits listed below. Pt will benefit from skilled PT to increase their independence and safety with mobility to allow discharge. Pt able to ambulate 400 ft today. Pt SPO2 dropped to 84% following dynamic balance activity after ambulating 300 feet. Pt began to report increased dizziness after dynamic balance activity and pt was returned to room and 3L O2. Pt would benefit from further balance assessment.     Follow Up Recommendations Home health PT;Supervision/Assistance - 24 hour    Equipment Recommendations  Cane (Pt may benefit from cane if dizziness persists with dynamic activity )    Recommendations for Other Services       Precautions / Restrictions Precautions Precautions: Fall Precaution Comments: Monitor vitals Restrictions Weight Bearing Restrictions: No      Mobility  Bed Mobility Overal bed mobility: Needs Assistance Bed Mobility: Supine to Sit     Supine to sit: HOB elevated;Min guard     General bed mobility comments: Min guard for safety, pt able to sit upright at EOB by self with increased time   Transfers Overall transfer level: Needs  assistance Equipment used: None Transfers: Sit to/from Stand Sit to Stand: Min guard         General transfer comment: Min guard for safety, pt able to rise and steady by self   Ambulation/Gait Ambulation/Gait assistance: Min guard Ambulation Distance (Feet): 400 Feet Assistive device: None Gait Pattern/deviations: Step-through pattern;Decreased stride length Gait velocity: decr   General Gait Details: Pt reports pain from HA as well as dizziness when attempted balance activities while ambulating. Pt SPO2 dropped to 84% after ambulating 300 feet on RA when conducting dynamic balance activities and pt was brought back to the room and put on 3L O2.  Pt HR elevated during session and remained between 60-75 BPM during session.  Stairs            Wheelchair Mobility    Modified Rankin (Stroke Patients Only)       Balance Overall balance assessment: Needs assistance Sitting-balance support: Feet supported Sitting balance-Leahy Scale: Good     Standing balance support: No upper extremity supported Standing balance-Leahy Scale: Good Standing balance comment: Pt reports slight diziness with dynamic activity which increases when completing balance activities and change in head positioning. Pt may benefit from further balance assessment                High Level Balance Comments: Pt reports increased dizziness when attempting to ambulate and look different directions              Pertinent Vitals/Pain Pain Assessment: No/denies pain    Home Living Family/patient expects to be discharged to:: Private residence Living Arrangements:  Spouse/significant other;Children Available Help at Discharge: Family (son, pt's spouse is disabled - uses wheelchair) Type of Home: House Home Access: Ramped entrance     Home Layout: One level Home Equipment: None      Prior Function Level of Independence: Independent         Comments: 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     Hand Dominance   Dominant Hand: Right    Extremity/Trunk Assessment   Upper Extremity Assessment Upper Extremity Assessment: Overall WFL for tasks assessed (Bilat UE found to be grossly [facility] MMT with all assessments with no sensation deficit)    Lower Extremity Assessment Lower Extremity Assessment: Overall WFL for tasks assessed;RLE deficits/detail (Bilat LE found to be grossly [facility] MMT with all assessments with no sensation deficit) RLE Deficits / Details: RLE edema       Communication   Communication: No difficulties  Cognition Arousal/Alertness: Awake/alert Behavior During Therapy: WFL for tasks assessed/performed Overall Cognitive Status: Impaired/Different from baseline                                 General Comments: Pt struggles to find words at times which she reports is new and different from baseline       General Comments      Exercises     Assessment/Plan    PT Assessment Patient needs continued PT services  PT Problem List Decreased mobility;Decreased safety awareness;Decreased activity tolerance;Decreased balance;Decreased knowledge of use of DME       PT Treatment Interventions DME instruction;Therapeutic activities;Gait training;Therapeutic exercise;Patient/family education;Balance training;Functional mobility training    PT Goals (Current goals can be found in the Care Plan section)  Acute Rehab PT Goals Patient Stated Goal: Pt would like to return home  PT Goal Formulation: With patient Time For Goal Achievement: 10/04/16 Potential to Achieve Goals: Fair    Frequency Min 3X/week   Barriers to discharge Other (comment) 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 : A Little Difficulty sitting down on and standing up from a chair with arms (e.g., wheelchair, bedside commode, etc,.)?: A Little Help needed moving to and from a bed to chair (including a wheelchair)?: A Little Help needed walking in hospital room?: A Little Help needed climbing 3-5 steps with a railing? : A Little 6 Click Score: 19    End of Session Equipment Utilized During Treatment: Gait belt Activity Tolerance: Patient tolerated treatment well Patient left: with call bell/phone within reach;with family/visitor present;with chair alarm set;in chair Nurse Communication: Mobility status PT Visit Diagnosis: Difficulty in walking, not elsewhere classified (R26.2)    Time: 9563-8756 PT Time Calculation (min) (ACUTE ONLY): 30 min   Charges:   PT Evaluation $PT Eval Moderate Complexity: 1 Mod PT Treatments $Gait Training: 8-22 mins   PT G Codes:        Olegario Shearer, SPT   Reino Bellis 09/20/2016, 12:52 PM

## 2016-09-20 NOTE — Progress Notes (Signed)
Spoke with Dr. Curt Bears about when to give amiodarone doses. Ordered to give if HR > 50.   Will make oncoming RN aware.  Rubye Beach, RN

## 2016-09-20 NOTE — Consult Note (Signed)
ELECTROPHYSIOLOGY CONSULT NOTE    Patient ID: Deanna Washington MRN: [id number], DOB/AGE: 1940-12-18 76 y.o.  Admit date: 09/18/2016 Date of Consult: 09/20/2016  Primary Physician: Haywood Pao, MD Primary Cardiologist: Oval Linsey (new this admission)  Patient Profile: Deanna Washington is a 76 y.o. female with a history of breast cancer s/p lumpectomy and XRT, now with brain tumor who is being seen today for the evaluation of tachy/brady syndrome at the request of Dr Oval Linsey.  HPI:  Deanna Washington is a 76 y.o. female admitted 09/18/16 with a 3 day history of nausea and vomiting.  She did not have diarrhea, fever, chills, or abdominal pain. She also had intermittent dizziness. Head CT demonstrated large R parieto-occipital mass with R->L midline shift. She was admitted for further evaluation.  MRI demonstrated 3 brain masses concerning for metastatic disease.  Telemetry has demonstrated AF with RVR as well as post termination pauses of up to 3 seconds, sinus bradycardia, and junctional rhythm.  She has been started on low dose amiodarone for rhythm control.   She denies chest pain, palpitations, dyspnea, PND, orthopnea, nausea, vomiting, dizziness, syncope, edema, weight gain, or early satiety.  Past Medical History:  Diagnosis Date  . Asthma   . Breast cancer ([place])   . HTN (hypertension)      Surgical History:  Past Surgical History:  Procedure Laterality Date  . BREAST LUMPECTOMY       Prescriptions Prior to Admission  Medication Sig Dispense Refill Last Dose  . ADVAIR DISKUS 500-50 MCG/DOSE AEPB Place 1 puff into alternate nostrils 2 (two) times daily.   09/17/2016 at Unknown time  . aspirin 325 MG tablet Take 325 mg by mouth once.   Past Week at Unknown time  . diphenhydrAMINE (BENADRYL) 25 mg capsule Take 25 mg by mouth every 6 (six) hours as needed for sleep.   09/17/2016 at Unknown time  . losartan-hydrochlorothiazide (HYZAAR) 50-12.5 MG tablet Take 1 tablet by mouth daily.    09/17/2016 at Unknown time  . PROAIR HFA 108 (90 Base) MCG/ACT inhaler Place 1-2 puffs into alternate nostrils every 4 (four) hours as needed for wheezing.   Past Week at Unknown time    Inpatient Medications: . amiodarone  200 mg Oral BID  . aspirin  300 mg Rectal Daily   Or  . aspirin  325 mg Oral Daily  . dexamethasone  10 mg Intravenous Q6H  . enoxaparin (LOVENOX) injection  40 mg Subcutaneous Q24H  . losartan  50 mg Oral Daily   And  . hydrochlorothiazide  12.5 mg Oral Daily  . levETIRAcetam  500 mg Oral BID  . mometasone-formoterol  2 puff Inhalation BID  . pantoprazole (PROTONIX) IV  40 mg Intravenous QHS    Allergies: No Known Allergies  Social History   Social History  . Marital status: Married    Spouse name: N/A  . Number of children: N/A  . Years of education: N/A   Occupational History  . Not on file.   Social History Main Topics  . Smoking status: Never Smoker  . Smokeless tobacco: Never Used  . Alcohol use Not on file  . Drug use: Unknown  . Sexual activity: Not on file   Other Topics Concern  . Not on file   Social History Narrative  . No narrative on file     Family History: Daughter - pacemaker No CAD, stroke, diabetes    Review of Systems: All other systems reviewed and are otherwise negative except as  noted above.  Physical Exam: Vitals:   09/20/16 0800 09/20/16 0900 09/20/16 0921 09/20/16 1000  BP: 126/65 (!) 142/75  135/68  Pulse: (!) 44 (!) 49  (!) 51  Resp: 18 (!) 22  18  Temp: 98 F (36.7 C)     TempSrc: Oral     SpO2: 92% 95% 94% 98%  Weight:      Height:        GEN- The patient is well appearing, alert and oriented x 3 today.   HEENT: normocephalic, atraumatic; sclera clear, conjunctiva pink; hearing intact; oropharynx clear; neck supple Lungs- Clear to ausculation bilaterally, normal work of breathing.  No wheezes, rales, rhonchi Heart- Regular rate and rhythm, no murmurs, rubs or gallops GI- soft, non-tender,  non-distended, bowel sounds present Extremities- no clubbing, cyanosis, or edema; DP/PT/radial pulses 2+ bilaterally MS- no significant deformity or atrophy Skin- warm and dry, no rash or lesion Psych- euthymic mood, full affect Neuro- strength and sensation are intact  Labs:  Lab Results  Component Value Date   WBC 9.5 09/20/2016   HGB 13.6 09/20/2016   HCT 40.4 09/20/2016   MCV 95.1 09/20/2016   PLT 297 09/20/2016    Recent Labs Lab 09/19/16 0319 09/20/16 0253  NA 138 142  K 3.8 4.3  CL 105 108  CO2 27 28  BUN 12 13  CREATININE 0.78 0.67  CALCIUM 8.9 9.3  PROT 6.3*  --   BILITOT 0.6  --   ALKPHOS 61  --   ALT 18  --   AST 21  --   GLUCOSE 126* 139*      Radiology/Studies: Dg Chest 2 View Result Date: 09/18/2016 CLINICAL DATA:  Generalized body aches, fever, chills, nausea and vomiting for 3 days. EXAM: CHEST  2 VIEW COMPARISON:  None. FINDINGS: There is cardiomegaly and vascular congestion. No consolidative process or pneumothorax. Trace bilateral pleural effusions noted. No acute bony abnormality. IMPRESSION: Cardiomegaly and pulmonary vascular congestion. Trace bilateral pleural effusions. Electronically Signed   By: Inge Rise M.D.   On: 09/18/2016 19:53   Ct Head Wo Contrast Result Date: 09/18/2016 CLINICAL DATA:  Generalized body aches, fever, chills, nausea and vomiting for 3 days. RIGHT eye and ear pain. EXAM: CT HEAD WITHOUT CONTRAST TECHNIQUE: Contiguous axial images were obtained from the base of the skull through the vertex without intravenous contrast. COMPARISON:  None. FINDINGS: BRAIN: 4.3 x 7.6 cm rounded hypodensity with heterogeneous component RIGHT prop occipital lobe effacing the RIGHT atrium. Surrounding apparent vasogenic edema. Local mass-effect, 3 mm RIGHT to LEFT midline shift. No intraparenchymal hemorrhage. No hydrocephalus. No abnormal extra-axial fluid collections. VASCULAR: Moderate calcific atherosclerosis of the carotid siphons. SKULL: No  skull fracture. No significant scalp soft tissue swelling. SINUSES/ORBITS: Trace paranasal sinus mucosal thickening. Mastoid air cells are well aerated. The included ocular globes and orbital contents are non-suspicious. Status post bilateral ocular lens implants. OTHER: None. IMPRESSION: 1. Large RIGHT parieto-occipital masslike hypodensity, differential diagnosis includes tumor/metastasis, cerebritis or less likely subacute MCA/ posterior watershed territory infarct. 3 mm RIGHT to LEFT midline shift. Recommend MRI of the brain with and without contrast. Acute findings discussed with and reconfirmed by [facility] NANAVATI on 09/18/2016 at 10:20 pm. Electronically Signed   By: Elon Alas M.D.   On: 09/18/2016 22:23   Mr Jeri Cos PF Contrast Result Date: 09/19/2016 CLINICAL DATA:  Abnormal head CT. Headaches. History breast cancer. EXAM: MRI HEAD WITHOUT AND WITH CONTRAST TECHNIQUE: Multiplanar, multiecho pulse sequences of the brain and  surrounding structures were obtained without and with intravenous contrast. CONTRAST:  79mL MULTIHANCE GADOBENATE DIMEGLUMINE 529 MG/ML IV SOLN COMPARISON:  Head CT from yesterday FINDINGS: Brain: Large heterogeneously enhancing mass in the posterior right cerebrum, spanning the parietal and occipital lobes and measuring up to 6 x 5 x 6.4 cm. This mass contains scattered cystic spaces and probable blood products. The overlying cortex is not visible and there is a thin line of overlying dural thickening. There is local mass effect, mild for the size of tumor. Mild vasogenic edema. 10 mm left cerebellar and 3 mm left parietal cortex enhancing masses, overall pattern suggesting metastatic disease. Punctate areas of cortical diffusion hyperintensity in the left occipital pole consistent with small infarcts. Mild restricted diffusion seen in the right cerebellum, suggesting subacute ischemia. Diffusion within the right-sided mass from blood products or other paramagnetic substance.  There is scattered FLAIR hyperintensities in the cerebral white matter consistent with chronic small vessel ischemia. Vascular: Major flow voids are preserved. Skull and upper cervical spine: No aggressive marrow lesion. Right parietal bone lesion has intrinsic T1 hyperintensity most consistent with hemangioma. Sinuses/Orbits: Bilateral cataract resection. Mild chronic sinusitis. IMPRESSION: 1. Metastatic pattern with 3 brain masses. The largest is in the posterior right cerebrum measuring up to 6.4 cm; local mass effect. The other lesions measure 1 cm in the left cerebellum and 3 mm in the left parietal cortex. 2. 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 Subacute ischemia in the upper right cerebellum. Electronically Signed   By: Monte Fantasia M.D.   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 She has had some dizziness while here, but it is difficult to tell how symptomatic her bradycardia is.  Heart rates are mostly 30-50's.   Per the patient's understanding, biopsy done today and then team Kayshawn Ozburn decide re: brain resection.  EP [place] Esch follow along over the weekend. I think she Tailer Volkert likely need pacemaker but would like to avoid if possible Echo pending Guy is 3, defer anticoagulation to primary team  2.  Newly diagnosed atrial fibrillation In the setting of newly diagnosed brain mets Decrease amiodarone to 200mg  daily tomorrow Could potentially discontinue altogether over the weekend and watch for recurrent AF  3.  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     Signed, Chanetta Marshall 09/20/2016 11:24 AM     I have seen and examined this patient with Chanetta Marshall.  Agree with above, note added to reflect my findings.  On exam, RRR, no murmurs, lungs clear.  76 year old lady who presented to the hospital on 8/22 with nausea and vomiting. She was found to have a) occipital mass, likely complications from breast cancer. Telemetry since being in the hospital demonstrated atrial fibrillation with rapid rates with posttermination pauses of up to 3 seconds, sinus bradycardia, junctional rhythm. She has been started on amiodarone 200 mg twice a day due to her bradycardia. Today, her rhythm has remained fairly stable without much in the way of bradycardia, though she did not receive her amiodarone this morning due to sinus bradycardia. We'll plan to continue amiodarone through the weekend, and if she does remain bradycardic  and have significant pauses, may be a candidate for pacemaker implantation. There is the plan for a meeting on the Monday to discuss her options for her brain mass. Anticoagulation per neurology, though she would benefit from stroke prevention due to atrial fibrillation.   Donnell Beauchamp M. Claudis Giovanelli MD 09/20/2016 6:46 PM

## 2016-09-20 NOTE — Consult Note (Signed)
Radiation Oncology         [phone] ________________________________  Name: Deanna Washington        MRN: [id number]  Date of Service: 09/19/16     DOB: February 19, 1940  AY:[id number], Fransico Him, MD  No ref. provider found     REFERRING PHYSICIAN: No ref. provider found   DIAGNOSIS: The primary encounter diagnosis was Intracranial mass. Diagnoses of Nausea and vomiting, intractability of vomiting not specified, unspecified vomiting type and Cancer [facility]) were also pertinent to this visit.   HISTORY OF PRESENT ILLNESS: Deanna Washington is a 76 y.o. female seen at the request of Dr. Sheran Fava for a newly note brain mass on MRI of the brain. The patient reports she presented to the ED with a progressive headache and after episodes of nausea and vomiting. A CT revealed a large mass in the right parieto occipital region with right to left midline shift of 3 mm. She was admitted and decadron was started. An MRI of the brain today revealed a 6 x 5 x 6.4 cm mass in the right cerebrum spanning the parietal and occipital lobes and containing scatered cystic spaces, there I slocal mass effects and 10 mm lesion on the left cerebella and 3 mm left parietal cortex masses. She was scanned with CT C/A/P today which revealed concerns for metastatic pulmonary disease involving both lungs and mediastinal adenopathy. There was also right inguinal adenopathy which biopsy has been planned. We are asked to see her to discuss radiotherapy options.    PREVIOUS RADIATION THERAPY: Yes      PAST MEDICAL HISTORY:  Past Medical History:  Diagnosis Date  . Asthma   . Breast cancer ([place])   . HTN (hypertension)        PAST SURGICAL HISTORY: Past Surgical History:  Procedure Laterality Date  . BREAST LUMPECTOMY       FAMILY HISTORY: no known history of malignancy  SOCIAL HISTORY:  reports that she has never smoked. She has never used smokeless tobacco. The patient is married and her husband is also a patient of Dr.  Ida Rogue in the process of receiving radiotherapy.    ALLERGIES: Patient has no known allergies.   MEDICATIONS:  Current Facility-Administered Medications  Medication Dose Route Frequency Provider Last Rate Last Dose  . 0.9 %  sodium chloride infusion   Intravenous Continuous Gennaro Africa, MD 75 mL/hr at 09/20/16 0600    . acetaminophen (TYLENOL) tablet 650 mg  650 mg Oral Q6H PRN Gennaro Africa, MD   650 mg at 09/19/16 0731  . albuterol (PROVENTIL) (2.5 MG/3ML) 0.083% nebulizer solution 3 mL  3 mL Inhalation Q4H PRN Gennaro Africa, MD      . amiodarone (PACERONE) tablet 200 mg  200 mg Oral BID Skeet Latch, MD   Stopped at 09/19/16 2200  . aspirin suppository 300 mg  300 mg Rectal Daily Short, Mackenzie, MD       Or  . aspirin tablet 325 mg  325 mg Oral Daily Short, Mackenzie, MD      . dexamethasone (DECADRON) injection 10 mg  10 mg Intravenous Q6H Kary Kos, MD   10 mg at 09/20/16 0501  . diphenhydrAMINE (BENADRYL) capsule 25 mg  25 mg Oral Q6H PRN Gennaro Africa, MD      . enoxaparin (LOVENOX) injection 40 mg  40 mg Subcutaneous Q24H Brynda Greathouse Sue-Ellen, PA      . losartan (COZAAR) tablet 50 mg  50 mg Oral Daily Gennaro Africa, MD   50  mg at 09/19/16 1000   And  . hydrochlorothiazide (MICROZIDE) capsule 12.5 mg  12.5 mg Oral Daily Gennaro Africa, MD   12.5 mg at 09/19/16 1000  . iopamidol (ISOVUE-300) 61 % injection 15 mL  15 mL Oral Once PRN Janece Canterbury, MD      . levETIRAcetam (KEPPRA) tablet 500 mg  500 mg Oral BID Kary Kos, MD   500 mg at 09/19/16 2131  . mometasone-formoterol (DULERA) 200-5 MCG/ACT inhaler 2 puff  2 puff Inhalation BID Gennaro Africa, MD   2 puff at 09/19/16 1956  . morphine 2 MG/ML injection 2 mg  2 mg Intravenous Q4H PRN Gennaro Africa, MD   2 mg at 09/19/16 1441  . ondansetron (ZOFRAN) injection 4 mg  4 mg Intravenous Q6H PRN Gennaro Africa, MD   4 mg at 09/19/16 1947  . pantoprazole (PROTONIX) injection 40 mg  40 mg Intravenous QHS Gennaro Africa, MD   40 mg  at 09/19/16 2131     REVIEW OF SYSTEMS: On review of systems, the patient reports that she is feeling better as far as nausea and headaches but still requires pain management for this. She denies any chest pain, shortness of breath, cough, fevers, chills, night sweats. She denies any bowel or bladder disturbances, and denies abdominal pain. She denies any new musculoskeletal or joint aches or pains. A complete review of systems is obtained and is otherwise negative.     PHYSICAL EXAM:  Wt Readings from Last 3 Encounters:  09/18/16 148 lb (67.1 kg)  09/19/16 148 lb (67.1 kg)   Temp Readings from Last 3 Encounters:  09/20/16 97.8 F (36.6 C) (Oral)   BP Readings from Last 3 Encounters:  09/20/16 (!) 125/59   Pulse Readings from Last 3 Encounters:  09/20/16 (!) 38     In general this is a tired appearing caucasian female in no acute distress. She is alert and oriented x4 and appropriate throughout the examination. HEENT reveals that the patient is normocephalic, atraumatic. EOMs are intact. PERRLA. Skin is intact without any evidence of gross lesions. Cardiopulmonary assessment is negative for acute distress and she exhibits normal effort. She appears to be neurologically intact grossly.    ECOG =3  0 - Asymptomatic (Fully active, able to carry on all predisease activities without restriction)  1 - Symptomatic but completely ambulatory (Restricted in physically strenuous activity but ambulatory and able to carry out work of a light or sedentary nature. For example, light housework, office work)  2 - Symptomatic, <50% in bed during the day (Ambulatory and capable of all self care but unable to carry out any work activities. Up and about more than 50% of waking hours)  3 - Symptomatic, >50% in bed, but not bedbound (Capable of only limited self-care, confined to bed or chair 50% or more of waking hours)  4 - Bedbound (Completely disabled. Cannot carry on any self-care. Totally  confined to bed or chair)  5 - Death   Eustace Pen MM, Creech RH, Tormey DC, et al. [phone]). "Toxicity and response criteria of the [facility]". [place] Oncol. 5 (6): 649-55    LABORATORY DATA:  Lab Results  Component Value Date   WBC 9.5 09/20/2016   HGB 13.6 09/20/2016   HCT 40.4 09/20/2016   MCV 95.1 09/20/2016   PLT 297 09/20/2016   Lab Results  Component Value Date   NA 142 09/20/2016   K 4.3 09/20/2016   CL 108 09/20/2016  CO2 28 09/20/2016   Lab Results  Component Value Date   ALT 18 09/19/2016   AST 21 09/19/2016   ALKPHOS 61 09/19/2016   BILITOT 0.6 09/19/2016      RADIOGRAPHY: Dg Chest 2 View  Result Date: 09/18/2016 CLINICAL DATA:  Generalized body aches, fever, chills, nausea and vomiting for 3 days. EXAM: CHEST  2 VIEW COMPARISON:  None. FINDINGS: There is cardiomegaly and vascular congestion. No consolidative process or pneumothorax. Trace bilateral pleural effusions noted. No acute bony abnormality. IMPRESSION: Cardiomegaly and pulmonary vascular congestion. Trace bilateral pleural effusions. Electronically Signed   By: Inge Rise M.D.   On: 09/18/2016 19:53   Ct Head Wo Contrast  Result Date: 09/18/2016 CLINICAL DATA:  Generalized body aches, fever, chills, nausea and vomiting for 3 days. RIGHT eye and ear pain. EXAM: CT HEAD WITHOUT CONTRAST TECHNIQUE: Contiguous axial images were obtained from the base of the skull through the vertex without intravenous contrast. COMPARISON:  None. FINDINGS: BRAIN: 4.3 x 7.6 cm rounded hypodensity with heterogeneous component RIGHT prop occipital lobe effacing the RIGHT atrium. Surrounding apparent vasogenic edema. Local mass-effect, 3 mm RIGHT to LEFT midline shift. No intraparenchymal hemorrhage. No hydrocephalus. No abnormal extra-axial fluid collections. VASCULAR: Moderate calcific atherosclerosis of the carotid siphons. SKULL: No skull fracture. No significant scalp soft tissue swelling.  SINUSES/ORBITS: Trace paranasal sinus mucosal thickening. Mastoid air cells are well aerated. The included ocular globes and orbital contents are non-suspicious. Status post bilateral ocular lens implants. OTHER: None. IMPRESSION: 1. Large RIGHT parieto-occipital masslike hypodensity, differential diagnosis includes tumor/metastasis, cerebritis or less likely subacute MCA/ posterior watershed territory infarct. 3 mm RIGHT to LEFT midline shift. Recommend MRI of the brain with and without contrast. Acute findings discussed with and reconfirmed by [facility] NANAVATI on 09/18/2016 at 10:20 pm. Electronically Signed   By: Elon Alas M.D.   On: 09/18/2016 22:23   Ct Chest W Contrast  Result Date: 09/19/2016 CLINICAL DATA:  Remote history of breast cancer with recent brain MRI demonstrating metastatic brain lesions. Restaging CT scans. EXAM: CT CHEST, ABDOMEN, AND PELVIS WITH CONTRAST TECHNIQUE: Multidetector CT imaging of the chest, abdomen and pelvis was performed following the standard protocol during bolus administration of intravenous contrast. CONTRAST:  135m ISOVUE-300 IOPAMIDOL (ISOVUE-300) INJECTION 61% COMPARISON:  None. FINDINGS: CT CHEST FINDINGS Cardiovascular: The heart is normal in size. No pericardial effusion. Mild tortuosity and calcification of the thoracic aorta but no aneurysm or dissection. The branch vessels are patent. Scattered coronary artery calcifications are noted. Mediastinum/Nodes: Necrotic appearing mediastinal lymphadenopathy. 3.3 x 2.1 cm nodal mass in the aorticopulmonary window on image number 25. There is also cystic/necrotic subcarinal lymphadenopathy with maximum short axis diameter of 25 mm on image number 33. Other scattered smaller lymph nodes are noted including a 9 mm right infrahilar lymph node on image number 38. The esophagus is grossly normal. Lungs/Pleura: Diffuse pulmonary metastatic disease with numerous bilateral pulmonary nodules. Index lesion in the right  middle lobe on image number 115 measures 12 x 11 mm. Left upper lobe nodule on image number 85 measures 7 x 5 mm. Right lower lobe lesion just above the right hemidiaphragm on image number 124 measures 14.5 x 12.5 mm. Large necrotic lesion in the as ago esophageal recess could be a necrotic metastatic lung lesion or a necrotic lymph node. It measures 4.2 by 3.0 cm. Small bilateral pleural effusions and bibasilar atelectasis. Chest wall/ Musculoskeletal: Surgical clips surrounding a cystic area in the right breast, likely small liquified hematoma  related to a prior lumpectomy. No obvious breast masses. No axillary lymphadenopathy. No supraclavicular lymphadenopathy. The thyroid gland appears normal. No definite metastatic bone disease. There is a small sclerotic lesion noted in the T1 vertebral body and also in the T3 vertebral body. I do not however see any other definite lesions to suggest metastatic disease. CT ABDOMEN PELVIS FINDINGS Hepatobiliary: No definite findings for hepatic metastatic disease. Layering high attenuation material in the gallbladder could be sludge or stones. No findings for acute cholecystitis. Pancreas: No mass, inflammation or ductal dilatation. Spleen: Normal size.  No focal lesions. Adrenals/Urinary Tract: The adrenal glands are unremarkable. Somewhat irregular appearing left upper pole cystic lesion measures less than 20 Hounsfield units and is most likely a cyst. No worrisome renal lesions or hydronephrosis. Stomach/Bowel: The stomach, duodenum, small bowel and colon are grossly normal. No acute inflammatory process, mass lesions or obstructive findings. Colonic diverticulosis without findings for acute diverticulitis. Vascular/Lymphatic: Moderate atherosclerotic calcifications involving a tortuous abdominal aorta but no focal aneurysm or dissection. The branch vessels are patent. The major venous structures are patent. 11.5 mm gastrohepatic ligament lymph node is noted on image number  60. There is also a low-attenuation retrocrural lymph node measures 11 mm on image number 59. No mesenteric or retroperitoneal mass or lymphadenopathy. Reproductive: The enlarged fibroid uterus. The ovaries appear normal. Other: No pelvic lymphadenopathy. There are enlarged right inguinal lymph nodes. Suspect postoperative changes in the right inguinal area. Musculoskeletal: No obvious metastatic bone disease. IMPRESSION: 1. Pulmonary metastatic disease and fairly extensive necrotic appearing mediastinal and hilar lymphadenopathy. 2. No obvious breast masses and no supraclavicular or axillary adenopathy. 3. No obvious abdominal/pelvic metastatic disease or osseous metastatic disease. A few small upper thoracic sclerotic bone lesions require observation. 4. Enlarged fibroid uterus. 5. Enlarged right inguinal lymph nodes and possible prior postoperative changes. Electronically Signed   By: Marijo Sanes M.D.   On: 09/19/2016 14:45   Mr Jeri Cos JO Contrast  Result Date: 09/19/2016 CLINICAL DATA:  Abnormal head CT. Headaches. History breast cancer. EXAM: MRI HEAD WITHOUT AND WITH CONTRAST TECHNIQUE: Multiplanar, multiecho pulse sequences of the brain and surrounding structures were obtained without and with intravenous contrast. CONTRAST:  82m MULTIHANCE GADOBENATE DIMEGLUMINE 529 MG/ML IV SOLN COMPARISON:  Head CT from yesterday FINDINGS: Brain: Large heterogeneously enhancing mass in the posterior right cerebrum, spanning the parietal and occipital lobes and measuring up to 6 x 5 x 6.4 cm. This mass contains scattered cystic spaces and probable blood products. The overlying cortex is not visible and there is a thin line of overlying dural thickening. There is local mass effect, mild for the size of tumor. Mild vasogenic edema. 10 mm left cerebellar and 3 mm left parietal cortex enhancing masses, overall pattern suggesting metastatic disease. Punctate areas of cortical diffusion hyperintensity in the left  occipital pole consistent with small infarcts. Mild restricted diffusion seen in the right cerebellum, suggesting subacute ischemia. Diffusion within the right-sided mass from blood products or other paramagnetic substance. There is scattered FLAIR hyperintensities in the cerebral white matter consistent with chronic small vessel ischemia. Vascular: Major flow voids are preserved. Skull and upper cervical spine: No aggressive marrow lesion. Right parietal bone lesion has intrinsic T1 hyperintensity most consistent with hemangioma. Sinuses/Orbits: Bilateral cataract resection. Mild chronic sinusitis. IMPRESSION: 1. Metastatic pattern with 3 brain masses. The largest is in the posterior right cerebrum measuring up to 6.4 cm; local mass effect. The other lesions measure 1 cm in the left cerebellum and 3  mm in the left parietal cortex. 2. 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 Subacute ischemia in the upper right cerebellum. Electronically Signed   By: Monte Fantasia M.D.   On: 09/19/2016 07:49   Ct Abdomen Pelvis W Contrast  Result Date: 09/19/2016 CLINICAL DATA:  Remote history of breast cancer with recent brain MRI demonstrating metastatic brain lesions. Restaging CT scans. EXAM: CT CHEST, ABDOMEN, AND PELVIS WITH CONTRAST TECHNIQUE: Multidetector CT imaging of the chest, abdomen and pelvis was performed following the standard protocol during bolus administration of intravenous contrast. CONTRAST:  168m ISOVUE-300 IOPAMIDOL (ISOVUE-300) INJECTION 61% COMPARISON:  None. FINDINGS: CT CHEST FINDINGS Cardiovascular: The heart is normal in size. No pericardial effusion. Mild tortuosity and calcification of the thoracic aorta but no aneurysm or dissection. The branch vessels are patent. Scattered coronary artery calcifications are noted. Mediastinum/Nodes: Necrotic appearing mediastinal lymphadenopathy. 3.3 x 2.1 cm nodal mass in the aorticopulmonary window on image number 25. There is also  cystic/necrotic subcarinal lymphadenopathy with maximum short axis diameter of 25 mm on image number 33. Other scattered smaller lymph nodes are noted including a 9 mm right infrahilar lymph node on image number 38. The esophagus is grossly normal. Lungs/Pleura: Diffuse pulmonary metastatic disease with numerous bilateral pulmonary nodules. Index lesion in the right middle lobe on image number 115 measures 12 x 11 mm. Left upper lobe nodule on image number 85 measures 7 x 5 mm. Right lower lobe lesion just above the right hemidiaphragm on image number 124 measures 14.5 x 12.5 mm. Large necrotic lesion in the as ago esophageal recess could be a necrotic metastatic lung lesion or a necrotic lymph node. It measures 4.2 by 3.0 cm. Small bilateral pleural effusions and bibasilar atelectasis. Chest wall/ Musculoskeletal: Surgical clips surrounding a cystic area in the right breast, likely small liquified hematoma related to a prior lumpectomy. No obvious breast masses. No axillary lymphadenopathy. No supraclavicular lymphadenopathy. The thyroid gland appears normal. No definite metastatic bone disease. There is a small sclerotic lesion noted in the T1 vertebral body and also in the T3 vertebral body. I do not however see any other definite lesions to suggest metastatic disease. CT ABDOMEN PELVIS FINDINGS Hepatobiliary: No definite findings for hepatic metastatic disease. Layering high attenuation material in the gallbladder could be sludge or stones. No findings for acute cholecystitis. Pancreas: No mass, inflammation or ductal dilatation. Spleen: Normal size.  No focal lesions. Adrenals/Urinary Tract: The adrenal glands are unremarkable. Somewhat irregular appearing left upper pole cystic lesion measures less than 20 Hounsfield units and is most likely a cyst. No worrisome renal lesions or hydronephrosis. Stomach/Bowel: The stomach, duodenum, small bowel and colon are grossly normal. No acute inflammatory process, mass  lesions or obstructive findings. Colonic diverticulosis without findings for acute diverticulitis. Vascular/Lymphatic: Moderate atherosclerotic calcifications involving a tortuous abdominal aorta but no focal aneurysm or dissection. The branch vessels are patent. The major venous structures are patent. 11.5 mm gastrohepatic ligament lymph node is noted on image number 60. There is also a low-attenuation retrocrural lymph node measures 11 mm on image number 59. No mesenteric or retroperitoneal mass or lymphadenopathy. Reproductive: The enlarged fibroid uterus. The ovaries appear normal. Other: No pelvic lymphadenopathy. There are enlarged right inguinal lymph nodes. Suspect postoperative changes in the right inguinal area. Musculoskeletal: No obvious metastatic bone disease. IMPRESSION: 1. Pulmonary metastatic disease and fairly extensive necrotic appearing mediastinal and hilar lymphadenopathy. 2. No obvious breast masses and no supraclavicular or axillary adenopathy. 3. No obvious  abdominal/pelvic metastatic disease or osseous metastatic disease. A few small upper thoracic sclerotic bone lesions require observation. 4. Enlarged fibroid uterus. 5. Enlarged right inguinal lymph nodes and possible prior postoperative changes. Electronically Signed   By: Marijo Sanes M.D.   On: 09/19/2016 14:45       IMPRESSION/PLAN: 1. Brain mass concerning for malignancy. I met with the patient and we reviewed her MRI results as well as her CT imaging. She may have a metastatic picture and is going to have a biopsy of an inguinal node tomorrow. We will discuss her case in conference on Monday as well, and hope to have information soon on the nature of her nodal disease. If her biopsy is inconclusive, she will likely undergo bronchoscopy. She is counseled that the type of cancer this is certainly dictates how we would approach radiotherapy recommendations. For now she will continue dexamethasone and we will plan to follow along  with recommendations for treatment as we learn more about the process driving her tumors.      Carola Rhine, PAC

## 2016-09-20 NOTE — Progress Notes (Signed)
  Echocardiogram 2D Echocardiogram has been performed.  Deanna Washington Deanna Washington 09/20/2016, 3:48 PM

## 2016-09-20 NOTE — Progress Notes (Signed)
OT Cancellation Note  Patient Details Name: Deanna Washington MRN: [id number] DOB: 1940/08/02   Cancelled Treatment:    Reason Eval/Treat Not Completed: Other (comment). Pt is sedated after bx. Will try to check back over the weekend, if schedule permits.  Robin Pafford 09/20/2016, 1:04 PM  Lesle Chris, OTR/L [phone] 09/20/2016

## 2016-09-21 DIAGNOSIS — D496 Neoplasm of unspecified behavior of brain: Secondary | ICD-10-CM

## 2016-09-21 LAB — BASIC METABOLIC PANEL
ANION GAP: 7 (ref 5–15)
BUN: 18 mg/dL (ref 6–20)
CALCIUM: 8.9 mg/dL (ref 8.9–10.3)
CO2: 26 mmol/L (ref 22–32)
CREATININE: 0.64 mg/dL (ref 0.44–1.00)
Chloride: 105 mmol/L (ref 101–111)
GFR calc Af Amer: >60 mL/min (ref >60)
GLUCOSE: 146 mg/dL — AB (ref 65–99)
Potassium: 4 mmol/L (ref 3.5–5.1)
Sodium: 138 mmol/L (ref 135–145)

## 2016-09-21 LAB — CEA: CEA1: 3.5 ng/mL (ref 0.0–4.7)

## 2016-09-21 LAB — CBC
HCT: 39.6 % (ref 36.0–46.0)
HEMOGLOBIN: 13.5 g/dL (ref 12.0–15.0)
MCH: 32.3 pg (ref 26.0–34.0)
MCHC: 34.1 g/dL (ref 30.0–36.0)
MCV: 94.7 fL (ref 78.0–100.0)
Platelets: 301 10*3/uL (ref 150–400)
RBC: 4.18 MIL/uL (ref 3.87–5.11)
RDW: 12.3 % (ref 11.5–15.5)
WBC: 11.3 10*3/uL — ABNORMAL HIGH (ref 4.0–10.5)

## 2016-09-21 LAB — GLUCOSE, CAPILLARY: GLUCOSE-CAPILLARY: 172 mg/dL — AB (ref 65–99)

## 2016-09-21 LAB — CULTURE, GROUP A STREP (THRC)

## 2016-09-21 LAB — CANCER ANTIGEN 27.29: CA 27.29: 174.7 U/mL — ABNORMAL HIGH (ref 0.0–38.6)

## 2016-09-21 LAB — MAGNESIUM: MAGNESIUM: 1.9 mg/dL (ref 1.7–2.4)

## 2016-09-21 MED ORDER — PANTOPRAZOLE SODIUM 40 MG PO TBEC
40.0000 mg | DELAYED_RELEASE_TABLET | Freq: Every day | ORAL | Status: DC
  Administered 2016-09-21 – 2016-09-24 (×4): 40 mg via ORAL
  Filled 2016-09-21 (×4): qty 1

## 2016-09-21 MED ORDER — ASPIRIN EC 81 MG PO TBEC: 81.0000 mg | DELAYED_RELEASE_TABLET | Freq: Every day | ORAL | Status: DC

## 2016-09-21 NOTE — Progress Notes (Signed)
Progress Note  Patient Name: Deanna Washington Date of Encounter: 09/21/2016  Primary Cardiologist:   Dr. Oval Linsey  Subjective   No pain.  HR was 30 this morning but she had no symptoms.  No dizziness  Inpatient Medications    Scheduled Meds: . amiodarone  200 mg Oral BID  . dexamethasone  10 mg Intravenous Q6H  . enoxaparin (LOVENOX) injection  40 mg Subcutaneous Q24H  . furosemide      . losartan  50 mg Oral Daily   And  . hydrochlorothiazide  12.5 mg Oral Daily  . levETIRAcetam  500 mg Oral BID  . mometasone-formoterol  2 puff Inhalation BID  . pantoprazole  40 mg Oral Daily   Continuous Infusions:  PRN Meds: acetaminophen, albuterol, diphenhydrAMINE, morphine injection, ondansetron (ZOFRAN) IV   Vital Signs    Vitals:   09/21/16 0200 09/21/16 0300 09/21/16 0411 09/21/16 0515  BP: (!) 153/70 135/75    Pulse: (!) 42 (!) 42    Resp: 17 16    Temp:   97.9 F (36.6 C)   TempSrc:   Oral   SpO2: 93% 94%    Weight:    169 lb 1.5 oz (76.7 kg)  Height:        Intake/Output Summary (Last 24 hours) at 09/21/16 0727 Last data filed at 09/21/16 0600  Gross per 24 hour  Intake             1800 ml  Output              825 ml  Net              975 ml   Filed Weights   09/18/16 1546 09/21/16 0515  Weight: 148 lb (67.1 kg) 169 lb 1.5 oz (76.7 kg)    Telemetry    SB and NSR, no atrial fib - Personally Reviewed  ECG    NA   - Personally Reviewed  Physical Exam   GEN: No acute distress.  Neck: No  JVD Cardiac: RRR, no murmurs, rubs, or gallops.  Respiratory: Clear  to auscultation bilaterally. GI: Soft, nontender, non-distended  MS: No  edema; No deformity. Neuro:  Nonfocal  Psych: Normal affect   Labs    Chemistry Recent Labs Lab 09/19/16 0319 09/20/16 0253 09/21/16 0309  NA 138 142 138  K 3.8 4.3 4.0  CL 105 108 105  CO2 27 28 26   GLUCOSE 126* 139* 146*  BUN 12 13 18   CREATININE 0.78 0.67 0.64  CALCIUM 8.9 9.3 8.9  PROT 6.3*  --   --     ALBUMIN 3.7  --   --   AST 21  --   --   ALT 18  --   --   ALKPHOS 61  --   --   BILITOT 0.6  --   --   GFRNONAA >60 >60 >60  GFRAA >60 >60 >60  ANIONGAP 6 6 7      Hematology Recent Labs Lab 09/19/16 0319 09/20/16 0253 09/21/16 0309  WBC 11.1* 9.5 11.3*  RBC 4.23 4.25 4.18  HGB 13.7 13.6 13.5  HCT 40.6 40.4 39.6  MCV 96.0 95.1 94.7  MCH 32.4 32.0 32.3  MCHC 33.7 33.7 34.1  RDW 12.4 12.3 12.3  PLT 261 297 301    Cardiac Enzymes Recent Labs Lab 09/19/16 1710 09/19/16 2302 09/20/16 0253  TROPONINI <0.03 <0.03 <0.03    Recent Labs Lab 09/18/16 1727  TROPIPOC 0.00  BNPNo results for input(s): BNP, PROBNP in the last 168 hours.   DDimer No results for input(s): DDIMER in the last 168 hours.   Radiology    Ct Chest W Contrast  Result Date: 09/19/2016 CLINICAL DATA:  Remote history of breast cancer with recent brain MRI demonstrating metastatic brain lesions. Restaging CT scans. EXAM: CT CHEST, ABDOMEN, AND PELVIS WITH CONTRAST TECHNIQUE: Multidetector CT imaging of the chest, abdomen and pelvis was performed following the standard protocol during bolus administration of intravenous contrast. CONTRAST:  162mL ISOVUE-300 IOPAMIDOL (ISOVUE-300) INJECTION 61% COMPARISON:  None. FINDINGS: CT CHEST FINDINGS Cardiovascular: The heart is normal in size. No pericardial effusion. Mild tortuosity and calcification of the thoracic aorta but no aneurysm or dissection. The branch vessels are patent. Scattered coronary artery calcifications are noted. Mediastinum/Nodes: Necrotic appearing mediastinal lymphadenopathy. 3.3 x 2.1 cm nodal mass in the aorticopulmonary window on image number 25. There is also cystic/necrotic subcarinal lymphadenopathy with maximum short axis diameter of 25 mm on image number 33. Other scattered smaller lymph nodes are noted including a 9 mm right infrahilar lymph node on image number 38. The esophagus is grossly normal. Lungs/Pleura: Diffuse pulmonary  metastatic disease with numerous bilateral pulmonary nodules. Index lesion in the right middle lobe on image number 115 measures 12 x 11 mm. Left upper lobe nodule on image number 85 measures 7 x 5 mm. Right lower lobe lesion just above the right hemidiaphragm on image number 124 measures 14.5 x 12.5 mm. Large necrotic lesion in the as ago esophageal recess could be a necrotic metastatic lung lesion or a necrotic lymph node. It measures 4.2 by 3.0 cm. Small bilateral pleural effusions and bibasilar atelectasis. Chest wall/ Musculoskeletal: Surgical clips surrounding a cystic area in the right breast, likely small liquified hematoma related to a prior lumpectomy. No obvious breast masses. No axillary lymphadenopathy. No supraclavicular lymphadenopathy. The thyroid gland appears normal. No definite metastatic bone disease. There is a small sclerotic lesion noted in the T1 vertebral body and also in the T3 vertebral body. I do not however see any other definite lesions to suggest metastatic disease. CT ABDOMEN PELVIS FINDINGS Hepatobiliary: No definite findings for hepatic metastatic disease. Layering high attenuation material in the gallbladder could be sludge or stones. No findings for acute cholecystitis. Pancreas: No mass, inflammation or ductal dilatation. Spleen: Normal size.  No focal lesions. Adrenals/Urinary Tract: The adrenal glands are unremarkable. Somewhat irregular appearing left upper pole cystic lesion measures less than 20 Hounsfield units and is most likely a cyst. No worrisome renal lesions or hydronephrosis. Stomach/Bowel: The stomach, duodenum, small bowel and colon are grossly normal. No acute inflammatory process, mass lesions or obstructive findings. Colonic diverticulosis without findings for acute diverticulitis. Vascular/Lymphatic: Moderate atherosclerotic calcifications involving a tortuous abdominal aorta but no focal aneurysm or dissection. The branch vessels are patent. The major venous  structures are patent. 11.5 mm gastrohepatic ligament lymph node is noted on image number 60. There is also a low-attenuation retrocrural lymph node measures 11 mm on image number 59. No mesenteric or retroperitoneal mass or lymphadenopathy. Reproductive: The enlarged fibroid uterus. The ovaries appear normal. Other: No pelvic lymphadenopathy. There are enlarged right inguinal lymph nodes. Suspect postoperative changes in the right inguinal area. Musculoskeletal: No obvious metastatic bone disease. IMPRESSION: 1. Pulmonary metastatic disease and fairly extensive necrotic appearing mediastinal and hilar lymphadenopathy. 2. No obvious breast masses and no supraclavicular or axillary adenopathy. 3. No obvious abdominal/pelvic metastatic disease or osseous metastatic disease. A few small upper  thoracic sclerotic bone lesions require observation. 4. Enlarged fibroid uterus. 5. Enlarged right inguinal lymph nodes and possible prior postoperative changes. Electronically Signed   By: Marijo Sanes M.D.   On: 09/19/2016 14:45   Mr Jeri Cos JS Contrast  Result Date: 09/19/2016 CLINICAL DATA:  Abnormal head CT. Headaches. History breast cancer. EXAM: MRI HEAD WITHOUT AND WITH CONTRAST TECHNIQUE: Multiplanar, multiecho pulse sequences of the brain and surrounding structures were obtained without and with intravenous contrast. CONTRAST:  57mL MULTIHANCE GADOBENATE DIMEGLUMINE 529 MG/ML IV SOLN COMPARISON:  Head CT from yesterday FINDINGS: Brain: Large heterogeneously enhancing mass in the posterior right cerebrum, spanning the parietal and occipital lobes and measuring up to 6 x 5 x 6.4 cm. This mass contains scattered cystic spaces and probable blood products. The overlying cortex is not visible and there is a thin line of overlying dural thickening. There is local mass effect, mild for the size of tumor. Mild vasogenic edema. 10 mm left cerebellar and 3 mm left parietal cortex enhancing masses, overall pattern suggesting  metastatic disease. Punctate areas of cortical diffusion hyperintensity in the left occipital pole consistent with small infarcts. Mild restricted diffusion seen in the right cerebellum, suggesting subacute ischemia. Diffusion within the right-sided mass from blood products or other paramagnetic substance. There is scattered FLAIR hyperintensities in the cerebral white matter consistent with chronic small vessel ischemia. Vascular: Major flow voids are preserved. Skull and upper cervical spine: No aggressive marrow lesion. Right parietal bone lesion has intrinsic T1 hyperintensity most consistent with hemangioma. Sinuses/Orbits: Bilateral cataract resection. Mild chronic sinusitis. IMPRESSION: 1. Metastatic pattern with 3 brain masses. The largest is in the posterior right cerebrum measuring up to 6.4 cm; local mass effect. The other lesions measure 1 cm in the left cerebellum and 3 mm in the left parietal cortex. 2. 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 Subacute ischemia in the upper right cerebellum. Electronically Signed   By: Monte Fantasia M.D.   On: 09/19/2016 07:49   Ct Abdomen Pelvis W Contrast  Result Date: 09/19/2016 CLINICAL DATA:  Remote history of breast cancer with recent brain MRI demonstrating metastatic brain lesions. Restaging CT scans. EXAM: CT CHEST, ABDOMEN, AND PELVIS WITH CONTRAST TECHNIQUE: Multidetector CT imaging of the chest, abdomen and pelvis was performed following the standard protocol during bolus administration of intravenous contrast. CONTRAST:  139mL ISOVUE-300 IOPAMIDOL (ISOVUE-300) INJECTION 61% COMPARISON:  None. FINDINGS: CT CHEST FINDINGS Cardiovascular: The heart is normal in size. No pericardial effusion. Mild tortuosity and calcification of the thoracic aorta but no aneurysm or dissection. The branch vessels are patent. Scattered coronary artery calcifications are noted. Mediastinum/Nodes: Necrotic appearing mediastinal lymphadenopathy. 3.3 x 2.1  cm nodal mass in the aorticopulmonary window on image number 25. There is also cystic/necrotic subcarinal lymphadenopathy with maximum short axis diameter of 25 mm on image number 33. Other scattered smaller lymph nodes are noted including a 9 mm right infrahilar lymph node on image number 38. The esophagus is grossly normal. Lungs/Pleura: Diffuse pulmonary metastatic disease with numerous bilateral pulmonary nodules. Index lesion in the right middle lobe on image number 115 measures 12 x 11 mm. Left upper lobe nodule on image number 85 measures 7 x 5 mm. Right lower lobe lesion just above the right hemidiaphragm on image number 124 measures 14.5 x 12.5 mm. Large necrotic lesion in the as ago esophageal recess could be a necrotic metastatic lung lesion or a necrotic lymph node. It measures 4.2 by 3.0 cm. Small  bilateral pleural effusions and bibasilar atelectasis. Chest wall/ Musculoskeletal: Surgical clips surrounding a cystic area in the right breast, likely small liquified hematoma related to a prior lumpectomy. No obvious breast masses. No axillary lymphadenopathy. No supraclavicular lymphadenopathy. The thyroid gland appears normal. No definite metastatic bone disease. There is a small sclerotic lesion noted in the T1 vertebral body and also in the T3 vertebral body. I do not however see any other definite lesions to suggest metastatic disease. CT ABDOMEN PELVIS FINDINGS Hepatobiliary: No definite findings for hepatic metastatic disease. Layering high attenuation material in the gallbladder could be sludge or stones. No findings for acute cholecystitis. Pancreas: No mass, inflammation or ductal dilatation. Spleen: Normal size.  No focal lesions. Adrenals/Urinary Tract: The adrenal glands are unremarkable. Somewhat irregular appearing left upper pole cystic lesion measures less than 20 Hounsfield units and is most likely a cyst. No worrisome renal lesions or hydronephrosis. Stomach/Bowel: The stomach, duodenum,  small bowel and colon are grossly normal. No acute inflammatory process, mass lesions or obstructive findings. Colonic diverticulosis without findings for acute diverticulitis. Vascular/Lymphatic: Moderate atherosclerotic calcifications involving a tortuous abdominal aorta but no focal aneurysm or dissection. The branch vessels are patent. The major venous structures are patent. 11.5 mm gastrohepatic ligament lymph node is noted on image number 60. There is also a low-attenuation retrocrural lymph node measures 11 mm on image number 59. No mesenteric or retroperitoneal mass or lymphadenopathy. Reproductive: The enlarged fibroid uterus. The ovaries appear normal. Other: No pelvic lymphadenopathy. There are enlarged right inguinal lymph nodes. Suspect postoperative changes in the right inguinal area. Musculoskeletal: No obvious metastatic bone disease. IMPRESSION: 1. Pulmonary metastatic disease and fairly extensive necrotic appearing mediastinal and hilar lymphadenopathy. 2. No obvious breast masses and no supraclavicular or axillary adenopathy. 3. No obvious abdominal/pelvic metastatic disease or osseous metastatic disease. A few small upper thoracic sclerotic bone lesions require observation. 4. Enlarged fibroid uterus. 5. Enlarged right inguinal lymph nodes and possible prior postoperative changes. Electronically Signed   By: Marijo Sanes M.D.   On: 09/19/2016 14:45   US Biopsy  Result Date: 09/20/2016 INDICATION: METASTATIC DISEASE, BRAIN METASTASES, INGUINAL ADENOPATHY, UNKNOWN PRIMARY EXAM: ULTRASOUND RIGHT INGUINAL ADENOPATHY 18 GAUGE CORE BIOPSY MEDICATIONS: 1% lidocaine local ANESTHESIA/SEDATION: Moderate (conscious) sedation was employed during this procedure. A total of Versed 2.0 mg and Fentanyl 100 mcg was administered intravenously. Moderate Sedation Time: 10 Minutes. The patient's level of consciousness and vital signs were monitored continuously by radiology nursing throughout the procedure under  my direct supervision. FLUOROSCOPY TIME:  Fluoroscopy Time: None. COMPLICATIONS: None immediate. PROCEDURE: Informed written consent was obtained from the patient after a thorough discussion of the procedural risks, benefits and alternatives. All questions were addressed. Maximal Sterile Barrier Technique was utilized including caps, mask, sterile gowns, sterile gloves, sterile drape, hand hygiene and skin antiseptic. A timeout was performed prior to the initiation of the procedure. Previous imaging reviewed. Preliminary ultrasound performed. Right inguinal abnormal adenopathy localized. Overlying skin marked. Under sterile conditions and local anesthesia, an 18 gauge core biopsy medial was advanced to the right inguinal abnormal adenopathy. 5 18 gauge core biopsies obtained. Samples placed in saline. Needle removed. Postprocedure imaging demonstrates no hemorrhage or hematoma. Patient tolerated the biopsy well. No Complication. IMPRESSION: Successful ultrasound right inguinal adenopathy 18 gauge core biopsies Electronically Signed   By: Jerilynn Mages.  Shick M.D.   On: 09/20/2016 12:50    Cardiac Studies   ECHO:  Study Conclusions  - Left ventricle: The cavity size was normal. There  was mild   concentric hypertrophy. Systolic function was normal. The   estimated ejection fraction was in the range of 60% to 65%. Wall   motion was normal; there were no regional wall motion   abnormalities. Doppler parameters are consistent with abnormal   left ventricular relaxation (grade 1 diastolic dysfunction).   There was no evidence of elevated ventricular filling pressure by   Doppler parameters. - Aortic valve: There was mild regurgitation. - Aortic root: The aortic root was normal in size. - Mitral valve: There was trivial regurgitation. - Left atrium: The atrium was normal in size. - Right atrium: The atrium was mildly dilated. - Tricuspid valve: There was moderate regurgitation. - Pulmonic valve: There was no  regurgitation. - Pulmonary arteries: Systolic pressure was moderately increased.   PA peak pressure: 45 mm Hg (S). - Inferior vena cava: The vessel was dilated. The respirophasic   diameter changes were blunted (< 50%), consistent with elevated   central venous pressure. - Pericardium, extracardiac: A trivial pericardial effusion was   identified posterior to the heart. Features were not consistent   with tamponade physiology.  Impressions:  - No cardiac source of emboli was indentified.   Patient Profile     76 y.o. 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   Assessment & Plan    ATRIAL FIB:  Tachybrady.  She has been seen by EP.   NSR/SB now.  Tolerating amiodarone.    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   Echo as above.  No cardiac source.  No plan for TEE.  Currently not an anticoagulation candidate.   She will need anticoagulation when/if this is thought to be safe from an oncology/neurosurgery standpoint. She would probably want warfarin secondary to cost.     Signed, Minus Breeding, MD  09/21/2016, 7:27 AM

## 2016-09-21 NOTE — Progress Notes (Signed)
OT Cancellation Note  Patient Details Name: Deanna Washington MRN: [id number] DOB: January 22, 1941   Cancelled Treatment:    Reason Eval/Treat Not Completed: Fatigue/lethargy limiting ability to participate.  Pt too fatiqued. Asked if OT can return tomorrow. Told her that we will come tomorrow if schedule permits, and if not, we will check back on Monday  [name] 09/21/2016, 2:05 PM  Lesle Chris, OTR/L [phone] 09/21/2016

## 2016-09-21 NOTE — Progress Notes (Addendum)
PROGRESS NOTE  Deanna Washington  CZY:[id number] DOB: Sep 18, 1940 DOA: 09/18/2016 PCP: No primary care provider on file.  Brief Narrative:   The patient is a 76 year old female with history of breast cancer status post lumpectomy and radiation 8 years ago who has been in remission and asthma who presented with progressive headache, nausea and vomiting. CT of the head demonstrated a large right parieto-occipital mass like hypodensity insistent with a tumor or metastases with 3 mm of right to left midline shift.  She was admitted and started on Decadron. MRI confirmed a 6 x 5 x 6.4 cm heterogeneous enhancing mass in the posterior right cerebrum spinning but the parietal and occipital lobes with scattered cystic spaces and blood products. There is local mass effect and mild vasogenic edema. There are also 2 additional masses a 10 mm left cerebellar mass and a 3 mm left parietal cortex mass suggestive of metastatic disease. In addition, she had punctate areas of cortical diffusion hyperintensity in the left occipital lobe consistent with small acute infarcts with a subacute ischemia in the upper right cerebellum.  Dr. Saintclair Halsted from neurosurgery reviewed the CT and MRI and felt that this might represent a high-grade glioma or glioblastoma. He increased the patient's Decadron to 10 mg IV every 6 hours and started Keppra for seizure prophylaxis.  CT of the chest/abdomen and pelvis demonstrates numerous lung nodules and mediastinal/hilar lymphadenopathy suggestive of possible prior lung CA vs. Recurrence of her breast cancer.  Her CA 27.29 is elevated suggestive of recurrent breast cancer.  She had a biopsy of her right inguinal lymph node and pathology is pending.  Her case will be discussed at tumor board on Monday.  In the meantime, EP is monitoring for significant pauses while on amiodarone as patient may need PPM insertion for tachy-brady syndrome.    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  Nausea and vomiting have improved -  CEA wnl -  CA 27.29 elevated suggestive of primary breast cancer -  Currently on Decadron 10 mg IV every 6 hours, but feeling better.  Will ask neurosurgery about starting a taper -  Continue Keppra 500 mg twice a day -  Right inguinal lymph node biopsy on 8/24, pathology pending -  If right inguinal lymph node is nondiagnostic, the next step would be pulmonology for bronch and biopsy -  Oncology consultation:  Dr. Jana Hakim -  XRT Onc:  Dr. Lisbeth Renshaw  -  NSGY:  Dr. Saintclair Halsted  Acute and subacute embolic cerebral infarcts, likely due to a-fib (new diagnosis as of 8/23) -  ECHO with bubble, preserved EF, grade 1 DD, moderate TR -  Carotid duplex:  Findings suggest 1-39% internal carotid artery stenosis bilaterally. -  A1c 5.4 -  LDL 87 -  PT/OT/SLP:  Home health PT, cane, 24 hour supervision -  Ongoing speech/congition therapy   Tachy-brady syndrome with pauses up to 3 seconds.  CHADs2vasc = 5.  Sinus bradycardia 40-60s since yesterday -  TSH 0.43 and troponins negative -  Cardiology and EP consults appreciated -  No A/C due to possible brain surgery -  Holding aspirin for possible neurosurgery on Wednesday  RLE edema -  Duplex RLE:  No DVT.  Incidentally found a vascularized hypoechoic homogenous area of the right groin measuring 2.8cm  HTN, blood pressures mildly elevated  - Continue hydrochlorothiazide and losartan  Asthma, stable, continue Dulera   DVT prophylaxis:  Lovenox Code Status:  Full code Family Communication:  Patient  alone Disposition Plan:  Tumor board will meet on Monday to discuss next steps.  If biopsy nondiagnostic, will need bronch with biopsy.  EP may need to put in pacemaker.   Consultants:   Neurosurgery  Oncology  Radiation oncology  Cardiology  EP  Procedures:  CT head MRI brain CT chest/ab/pelvis Right inguinal lymph node biopsy  Antimicrobials:  Anti-infectives    None       Subjective:  Headache recurred this morning and feeling tired.  Nausea has resolved.  Denies focal numbness, tingling, weakness.  Denies chest pains, lightheadedness, SOB.    Objective: Vitals:   09/21/16 1000 09/21/16 1100 09/21/16 1200 09/21/16 1300  BP: 105/79 (!) 152/88 125/68 (!) 147/85  Pulse: (!) 53 63 (!) 54 (!) 54  Resp: 12 16 15 17   Temp:   97.6 F (36.4 C)   TempSrc:   Oral   SpO2: 93% 93% 92% 94%  Weight:      Height:        Intake/Output Summary (Last 24 hours) at 09/21/16 1427 Last data filed at 09/21/16 1000  Gross per 24 hour  Intake             1400 ml  Output              825 ml  Net              575 ml   Filed Weights   09/18/16 1546 09/21/16 0515  Weight: 67.1 kg (148 lb) 76.7 kg (169 lb 1.5 oz)    Examination:  General exam:  Adult female, NAD  HEENT: NCAT, MMM Respiratory system: CTAB Cardiovascular system:  Bradycardic, regular rhythm, normal S1/S2, no gallops Gastrointestinal system: NABS, soft, ND/NT MSK: normal tone and bulk, stable 2+ right lower extremity edema, no LLE edema Neuro:  CN II - XII grossly intact, strength 5/5 throughout and SITLT, persistent mild dysmetria of left upper an lower extremities   Data Reviewed: I have personally reviewed following labs and imaging studies  CBC:  Recent Labs Lab 09/18/16 1659 09/19/16 0319 09/20/16 0253 09/21/16 0309  WBC 10.8* 11.1* 9.5 11.3*  HGB 14.9 13.7 13.6 13.5  HCT 43.3 40.6 40.4 39.6  MCV 93.5 96.0 95.1 94.7  PLT 286 261 297 009   Basic Metabolic Panel:  Recent Labs Lab  09/18/16 1659 09/19/16 0319 09/20/16 0253 09/21/16 0309  NA 138 138 142 138  K 3.6 3.8 4.3 4.0  CL 103 105 108 105  CO2 26 27 28 26   GLUCOSE 112* 126* 139* 146*  BUN 12 12 13 18   CREATININE 0.74 0.78 0.67 0.64  CALCIUM 9.6 8.9 9.3 8.9  MG  --   --  1.9 1.9   GFR: Estimated Creatinine Clearance: 67.2 mL/min (by C-G formula based on SCr of 0.64 mg/dL). Liver Function Tests:  Recent Labs Lab 09/19/16 0319  AST 21  ALT 18  ALKPHOS 61  BILITOT 0.6  PROT 6.3*  ALBUMIN 3.7    Recent Labs Lab 09/19/16 0319  LIPASE 29   No results for input(s):  AMMONIA in the last 168 hours. Coagulation Profile:  Recent Labs Lab 2016/10/14 0253  INR 1.03   Cardiac Enzymes:  Recent Labs Lab 09/19/16 1710 09/19/16 2302 10/14/16 0253  TROPONINI <0.03 <0.03 <0.03   BNP (last 3 results) No results for input(s): PROBNP in the last 8760 hours. HbA1C:  Recent Labs  10-14-16 0253  HGBA1C 5.4   CBG:  Recent Labs Lab 09/18/16 1711 09/19/16 0817 10-14-2016 0858 09/21/16 0748  GLUCAP 107* 116* 124* 172*   Lipid Profile:  Recent Labs  Oct 14, 2016 0253  CHOL 157  HDL 52  LDLCALC 87  TRIG 91  CHOLHDL 3.0   Thyroid Function Tests:  Recent Labs  09/19/16 1511  TSH 0.430  FREET4 0.90   Anemia Panel: No results for input(s): VITAMINB12, FOLATE, FERRITIN, TIBC, IRON, RETICCTPCT in the last 72 hours. Urine analysis:    Component Value Date/Time   COLORURINE AMBER (A) 09/18/2016 1659   APPEARANCEUR HAZY (A) 09/18/2016 1659   LABSPEC 1.027 09/18/2016 1659   PHURINE 5.0 09/18/2016 1659   GLUCOSEU NEGATIVE 09/18/2016 1659   HGBUR SMALL (A) 09/18/2016 1659   BILIRUBINUR NEGATIVE 09/18/2016 1659   KETONESUR 80 (A) 09/18/2016 1659   PROTEINUR 30 (A) 09/18/2016 1659   NITRITE NEGATIVE 09/18/2016 1659   LEUKOCYTESUR MODERATE (A) 09/18/2016 1659   Sepsis Labs: @LABRCNTIP (procalcitonin:4,lacticidven:4)  ) Recent Results (from the past 240 hour(s))  Rapid strep screen      Status: None   Collection Time: 09/18/16  5:20 PM  Result Value Ref Range Status   Streptococcus, Group A Screen (Direct) NEGATIVE NEGATIVE Final    Comment: (NOTE) A Rapid Antigen test may result negative if the antigen level in the sample is below the detection level of this test. The FDA has not cleared this test as a stand-alone test therefore the rapid antigen negative result has reflexed to a Group A Strep culture.   Culture, group A strep     Status: None   Collection Time: 09/18/16  5:20 PM  Result Value Ref Range Status   Specimen Description THROAT  Final   Special Requests NONE Reflexed from [id number]  Final   Culture   Final    NO GROUP A STREP (S.PYOGENES) ISOLATED Performed at [facility], [address], [place], [place] [address]    Report Status 09/21/2016 FINAL  Final  MRSA PCR Screening     Status: None   Collection Time: 09/19/16  2:55 AM  Result Value Ref Range Status   MRSA by PCR NEGATIVE NEGATIVE Final    Comment:        The GeneXpert MRSA Assay (FDA approved for NASAL specimens only), is one component of a comprehensive MRSA colonization surveillance program. It is not intended to diagnose MRSA infection nor to guide or monitor treatment for MRSA infections.       Radiology Studies: US Biopsy  Result Date: 2016/10/14 INDICATION: METASTATIC DISEASE, BRAIN METASTASES, INGUINAL ADENOPATHY, UNKNOWN PRIMARY EXAM: ULTRASOUND RIGHT INGUINAL ADENOPATHY 18 GAUGE CORE BIOPSY MEDICATIONS: 1% lidocaine local ANESTHESIA/SEDATION: Moderate (conscious) sedation was employed during this procedure. A total of Versed 2.0 mg and Fentanyl 100 mcg was administered intravenously. Moderate Sedation Time: 10 Minutes. The patient's level of consciousness and vital signs were monitored continuously by radiology nursing throughout the procedure under my direct supervision. FLUOROSCOPY TIME:  Fluoroscopy Time: None. COMPLICATIONS: None immediate. PROCEDURE: Informed written  consent was obtained from the patient after a thorough discussion of the procedural risks, benefits and alternatives. All  questions were addressed. Maximal Sterile Barrier Technique was utilized including caps, mask, sterile gowns, sterile gloves, sterile drape, hand hygiene and skin antiseptic. A timeout was performed prior to the initiation of the procedure. Previous imaging reviewed. Preliminary ultrasound performed. Right inguinal abnormal adenopathy localized. Overlying skin marked. Under sterile conditions and local anesthesia, an 18 gauge core biopsy medial was advanced to the right inguinal abnormal adenopathy. 5 18 gauge core biopsies obtained. Samples placed in saline. Needle removed. Postprocedure imaging demonstrates no hemorrhage or hematoma. Patient tolerated the biopsy well. No Complication. IMPRESSION: Successful ultrasound right inguinal adenopathy 18 gauge core biopsies Electronically Signed   By: Jerilynn Mages.  Shick M.D.   On: 09/20/2016 12:50     Scheduled Meds: . amiodarone  200 mg Oral BID  . dexamethasone  10 mg Intravenous Q6H  . enoxaparin (LOVENOX) injection  40 mg Subcutaneous Q24H  . losartan  50 mg Oral Daily   And  . hydrochlorothiazide  12.5 mg Oral Daily  . levETIRAcetam  500 mg Oral BID  . mometasone-formoterol  2 puff Inhalation BID  . pantoprazole  40 mg Oral Daily   Continuous Infusions:    LOS: 2 days    Time spent: 30 min    Janece Canterbury, MD [facility] Pager [phone]  If 7PM-7AM, please contact night-coverage [email or web address] Password [facility] 09/21/2016, 2:27 PM

## 2016-09-22 DIAGNOSIS — C50919 Malignant neoplasm of unspecified site of unspecified female breast: Secondary | ICD-10-CM

## 2016-09-22 LAB — BASIC METABOLIC PANEL
Anion gap: 6 (ref 5–15)
BUN: 17 mg/dL (ref 6–20)
CHLORIDE: 104 mmol/L (ref 101–111)
CO2: 30 mmol/L (ref 22–32)
CREATININE: 0.69 mg/dL (ref 0.44–1.00)
Calcium: 9.1 mg/dL (ref 8.9–10.3)
GFR calc non Af Amer: >60 mL/min (ref >60)
Glucose, Bld: 151 mg/dL — ABNORMAL HIGH (ref 65–99)
POTASSIUM: 4.2 mmol/L (ref 3.5–5.1)
Sodium: 140 mmol/L (ref 135–145)

## 2016-09-22 LAB — CBC
HEMATOCRIT: 42.9 % (ref 36.0–46.0)
HEMOGLOBIN: 14.4 g/dL (ref 12.0–15.0)
MCH: 32 pg (ref 26.0–34.0)
MCHC: 33.6 g/dL (ref 30.0–36.0)
MCV: 95.3 fL (ref 78.0–100.0)
PLATELETS: 306 10*3/uL (ref 150–400)
RBC: 4.5 MIL/uL (ref 3.87–5.11)
RDW: 12.4 % (ref 11.5–15.5)
WBC: 12.1 10*3/uL — ABNORMAL HIGH (ref 4.0–10.5)

## 2016-09-22 LAB — GLUCOSE, CAPILLARY: Glucose-Capillary: 145 mg/dL — ABNORMAL HIGH (ref 65–99)

## 2016-09-22 LAB — MAGNESIUM: Magnesium: 1.9 mg/dL (ref 1.7–2.4)

## 2016-09-22 MED ORDER — MAGNESIUM SULFATE IN D5W 1-5 GM/100ML-% IV SOLN
1.0000 g | Freq: Once | INTRAVENOUS | Status: AC
  Administered 2016-09-22: 1 g via INTRAVENOUS
  Filled 2016-09-22: qty 100

## 2016-09-22 NOTE — Progress Notes (Signed)
Progress Note  Patient Name: Deanna Washington Date of Encounter: 09/22/2016  Primary Cardiologist:   Dr. Oval Linsey  Subjective   Bradycardia noted.  However, she is completely asymptomatic with this.  No presyncope or syncope.  No chest pain  Inpatient Medications    Scheduled Meds: . amiodarone  200 mg Oral BID  . dexamethasone  10 mg Intravenous Q6H  . enoxaparin (LOVENOX) injection  40 mg Subcutaneous Q24H  . losartan  50 mg Oral Daily   And  . hydrochlorothiazide  12.5 mg Oral Daily  . levETIRAcetam  500 mg Oral BID  . mometasone-formoterol  2 puff Inhalation BID  . pantoprazole  40 mg Oral Daily   Continuous Infusions: . magnesium sulfate 1 - 4 g bolus IVPB     PRN Meds: acetaminophen, albuterol, diphenhydrAMINE, morphine injection, ondansetron (ZOFRAN) IV   Vital Signs    Vitals:   09/21/16 2145 09/22/16 0254 09/22/16 0532 09/22/16 0913  BP: 116/68 (!) 143/79 (!) 142/79   Pulse: (!) 49 (!) 48 (!) 52   Resp: 18  18   Temp: 97.6 F (36.4 C)  97.6 F (36.4 C)   TempSrc: Oral  Oral   SpO2: 92%  97% 98%  Weight:      Height:        Intake/Output Summary (Last 24 hours) at 09/22/16 0936 Last data filed at 09/21/16 1800  Gross per 24 hour  Intake              200 ml  Output              700 ml  Net             -500 ml   Filed Weights   09/18/16 1546 09/21/16 0515  Weight: 148 lb (67.1 kg) 169 lb 1.5 oz (76.7 kg)    Telemetry    SB.  No atrial fib - Personally Reviewed  ECG    NA   - Personally Reviewed  Physical Exam   GEN: No  acute distress.   Neck: No  JVD Cardiac: RRR, no murmurs, rubs, or gallops.  Respiratory: Clear   to auscultation bilaterally. GI: Soft, nontender, non-distended, normal bowel sounds  MS:  No edema; No deformity. Neuro:   Nonfocal  Psych: Oriented and appropriate     Labs    Chemistry  Recent Labs Lab 09/19/16 0319 09/20/16 0253 09/21/16 0309 09/22/16 0455  NA 138 142 138 140  K 3.8 4.3 4.0 4.2  CL 105  108 105 104  CO2 27 28 26 30   GLUCOSE 126* 139* 146* 151*  BUN 12 13 18 17   CREATININE 0.78 0.67 0.64 0.69  CALCIUM 8.9 9.3 8.9 9.1  PROT 6.3*  --   --   --   ALBUMIN 3.7  --   --   --   AST 21  --   --   --   ALT 18  --   --   --   ALKPHOS 61  --   --   --   BILITOT 0.6  --   --   --   GFRNONAA >60 >60 >60 >60  GFRAA >60 >60 >60 >60  ANIONGAP 6 6 7 6      Hematology  Recent Labs Lab 09/20/16 0253 09/21/16 0309 09/22/16 0455  WBC 9.5 11.3* 12.1*  RBC 4.25 4.18 4.50  HGB 13.6 13.5 14.4  HCT 40.4 39.6 42.9  MCV 95.1 94.7 95.3  MCH 32.0 32.3  32.0  MCHC 33.7 34.1 33.6  RDW 12.3 12.3 12.4  PLT 297 301 306    Cardiac Enzymes  Recent Labs Lab 09/19/16 1710 09/19/16 2302 09/20/16 0253  TROPONINI <0.03 <0.03 <0.03     Recent Labs Lab 09/18/16 1727  TROPIPOC 0.00     BNPNo results for input(s): BNP, PROBNP in the last 168 hours.   DDimer No results for input(s): DDIMER in the last 168 hours.   Radiology    US Biopsy  Result Date: 09/20/2016 INDICATION: METASTATIC DISEASE, BRAIN METASTASES, INGUINAL ADENOPATHY, UNKNOWN PRIMARY EXAM: ULTRASOUND RIGHT INGUINAL ADENOPATHY 18 GAUGE CORE BIOPSY MEDICATIONS: 1% lidocaine local ANESTHESIA/SEDATION: Moderate (conscious) sedation was employed during this procedure. A total of Versed 2.0 mg and Fentanyl 100 mcg was administered intravenously. Moderate Sedation Time: 10 Minutes. The patient's level of consciousness and vital signs were monitored continuously by radiology nursing throughout the procedure under my direct supervision. FLUOROSCOPY TIME:  Fluoroscopy Time: None. COMPLICATIONS: None immediate. PROCEDURE: Informed written consent was obtained from the patient after a thorough discussion of the procedural risks, benefits and alternatives. All questions were addressed. Maximal Sterile Barrier Technique was utilized including caps, mask, sterile gowns, sterile gloves, sterile drape, hand hygiene and skin antiseptic. A  timeout was performed prior to the initiation of the procedure. Previous imaging reviewed. Preliminary ultrasound performed. Right inguinal abnormal adenopathy localized. Overlying skin marked. Under sterile conditions and local anesthesia, an 18 gauge core biopsy medial was advanced to the right inguinal abnormal adenopathy. 5 18 gauge core biopsies obtained. Samples placed in saline. Needle removed. Postprocedure imaging demonstrates no hemorrhage or hematoma. Patient tolerated the biopsy well. No Complication. IMPRESSION: Successful ultrasound right inguinal adenopathy 18 gauge core biopsies Electronically Signed   By: Jerilynn Mages.  Shick M.D.   On: 09/20/2016 12:50    Cardiac Studies   ECHO:  Study Conclusions  - Left ventricle: The cavity size was normal. There was mild   concentric hypertrophy. Systolic function was normal. The   estimated ejection fraction was in the range of 60% to 65%. Wall   motion was normal; there were no regional wall motion   abnormalities. Doppler parameters are consistent with abnormal   left ventricular relaxation (grade 1 diastolic dysfunction).   There was no evidence of elevated ventricular filling pressure by   Doppler parameters. - Aortic valve: There was mild regurgitation. - Aortic root: The aortic root was normal in size. - Mitral valve: There was trivial regurgitation. - Left atrium: The atrium was normal in size. - Right atrium: The atrium was mildly dilated. - Tricuspid valve: There was moderate regurgitation. - Pulmonic valve: There was no regurgitation. - Pulmonary arteries: Systolic pressure was moderately increased.   PA peak pressure: 45 mm Hg (S). - Inferior vena cava: The vessel was dilated. The respirophasic   diameter changes were blunted (< 50%), consistent with elevated   central venous pressure. - Pericardium, extracardiac: A trivial pericardial effusion was   identified posterior to the heart. Features were not consistent   with  tamponade physiology.  Impressions:  - No cardiac source of emboli was indentified.   Patient Profile     76 y.o. 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   Assessment & Plan    ATRIAL FIB:  Tachybrady.  She has been seen by EP.   NSR/SB now.  Tolerating amiodarone.  Start warfarin if OK with primary team and neurosurgery.  Bradycardia noted but she is asymptomatic.  OK to continue amiodarone.  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   Echo as above.  No further plan for work up.  See above.    Signed, Minus Breeding, MD  09/22/2016, 9:36 AM

## 2016-09-22 NOTE — Progress Notes (Signed)
Deanna Washington   DOB:Jan 08, 1941   EH#:[id number]   EZM#:[id number]  Subjective:  Deanna Washington tells me she is breathing "OK." No cough, phlegm, pleurisy. Ambulating a little. No h/a, N/V, dizzyness. Daughter in room  Objective: middle aged White woman examined in recliner Vitals:   09/22/16 0532 09/22/16 0913  BP: (!) 142/79   Pulse: (!) 52   Resp: 18   Temp: 97.6 F (36.4 C)   SpO2: 97% 98%    Body mass index is 25.71 kg/m.  Intake/Output Summary (Last 24 hours) at 09/22/16 1044 Last data filed at 09/21/16 1800  Gross per 24 hour  Intake              200 ml  Output              700 ml  Net             -500 ml    Sclerae unicteric, EOMs intact No cervical or supraclavicular adenopathy Lungs no rales or rhonchi Heart regular rate and rhythm Abd soft, nontender, positive bowel sounds Neuro: nonfocal, well oriented, appropriate affect Breasts: Deferred   CBG (last 3)   Recent Labs  09/20/16 0858 09/21/16 0748 09/22/16 0755  GLUCAP 124* 172* 145*     Labs:  Lab Results  Component Value Date   WBC 12.1 (H) 09/22/2016   HGB 14.4 09/22/2016   HCT 42.9 09/22/2016   MCV 95.3 09/22/2016   PLT 306 09/22/2016    _0 @  Urine Studies No results for input(s): UHGB, CRYS in the last 72 hours.  Invalid input(s): UACOL, UAPR, USPG, UPH, UTP, UGL, UKET, UBIL, UNIT, UROB, [place], UEPI, UWBC, Junie Panning [place], [place], [place]  Basic Metabolic Panel:  Recent Labs Lab 09/18/16 1659 09/19/16 0319 09/20/16 0253 09/21/16 0309 09/22/16 0455  NA 138 138 142 138 140  K 3.6 3.8 4.3 4.0 4.2  CL 103 105 108 105 104  CO2 _1 GLUCOSE 112* 126* 139* 146* 151*  BUN _2 CREATININE 0.74 0.78 0.67 0.64 0.69  CALCIUM 9.6 8.9 9.3 8.9 9.1  MG  --   --  1.9 1.9 1.9   GFR Estimated Creatinine Clearance: 67.2 mL/min (by C-G formula based on SCr of 0.69 mg/dL). Liver Function Tests:  Recent Labs Lab 09/19/16 0319  AST 21  ALT 18  ALKPHOS 61  BILITOT 0.6   PROT 6.3*  ALBUMIN 3.7    Recent Labs Lab 09/19/16 0319  LIPASE 29   No results for input(s): AMMONIA in the last 168 hours. Coagulation profile  Recent Labs Lab 09/20/16 0253  INR 1.03    CBC:  Recent Labs Lab 09/18/16 1659 09/19/16 0319 09/20/16 0253 09/21/16 0309 09/22/16 0455  WBC 10.8* 11.1* 9.5 11.3* 12.1*  HGB 14.9 13.7 13.6 13.5 14.4  HCT 43.3 40.6 40.4 39.6 42.9  MCV 93.5 96.0 95.1 94.7 95.3  PLT 286 261 297 301 306   Cardiac Enzymes:  Recent Labs Lab 09/19/16 1710 09/19/16 2302 09/20/16 0253  TROPONINI <0.03 <0.03 <0.03   BNP: Invalid input(s): POCBNP CBG:  Recent Labs Lab 09/18/16 1711 09/19/16 0817 09/20/16 0858 09/21/16 0748 09/22/16 0755  GLUCAP 107* 116* 124* 172* 145*   D-Dimer No results for input(s): DDIMER in the last 72 hours. Hgb A1c  Recent Labs  09/20/16 0253  HGBA1C 5.4   Lipid Profile  Recent Labs  09/20/16 0253  CHOL 157  HDL 52  LDLCALC 87  TRIG 91  CHOLHDL 3.0   Thyroid function studies  Recent Labs  09/19/16 1511  TSH 0.430   Anemia work up No results for input(s): VITAMINB12, FOLATE, FERRITIN, TIBC, IRON, RETICCTPCT in the last 72 hours. Microbiology Recent Results (from the past 240 hour(s))  Rapid strep screen     Status: None   Collection Time: 09/18/16  5:20 PM  Result Value Ref Range Status   Streptococcus, Group A Screen (Direct) NEGATIVE NEGATIVE Final    Comment: (NOTE) A Rapid Antigen test may result negative if the antigen level in the sample is below the detection level of this test. The FDA has not cleared this test as a stand-alone test therefore the rapid antigen negative result has reflexed to a Group A Strep culture.   Culture, group A strep     Status: None   Collection Time: 09/18/16  5:20 PM  Result Value Ref Range Status   Specimen Description THROAT  Final   Special Requests NONE Reflexed from [id number]  Final   Culture   Final    NO GROUP A STREP (S.PYOGENES)  ISOLATED Performed at [facility], [address] [place], [place] [address]    Report Status 09/21/2016 FINAL  Final  MRSA PCR Screening     Status: None   Collection Time: 09/19/16  2:55 AM  Result Value Ref Range Status   MRSA by PCR NEGATIVE NEGATIVE Final    Comment:        The GeneXpert MRSA Assay (FDA approved for NASAL specimens only), is one component of a comprehensive MRSA colonization surveillance program. It is not intended to diagnose MRSA infection nor to guide or monitor treatment for MRSA infections.       Studies:  US Biopsy  Result Date: 09/20/2016 INDICATION: METASTATIC DISEASE, BRAIN METASTASES, INGUINAL ADENOPATHY, UNKNOWN PRIMARY EXAM: ULTRASOUND RIGHT INGUINAL ADENOPATHY 18 GAUGE CORE BIOPSY MEDICATIONS: 1% lidocaine local ANESTHESIA/SEDATION: Moderate (conscious) sedation was employed during this procedure. A total of Versed 2.0 mg and Fentanyl 100 mcg was administered intravenously. Moderate Sedation Time: 10 Minutes. The patient's level of consciousness and vital signs were monitored continuously by radiology nursing throughout the procedure under my direct supervision. FLUOROSCOPY TIME:  Fluoroscopy Time: None. COMPLICATIONS: None immediate. PROCEDURE: Informed written consent was obtained from the patient after a thorough discussion of the procedural risks, benefits and alternatives. All questions were addressed. Maximal Sterile Barrier Technique was utilized including caps, mask, sterile gowns, sterile gloves, sterile drape, hand hygiene and skin antiseptic. A timeout was performed prior to the initiation of the procedure. Previous imaging reviewed. Preliminary ultrasound performed. Right inguinal abnormal adenopathy localized. Overlying skin marked. Under sterile conditions and local anesthesia, an 18 gauge core biopsy medial was advanced to the right inguinal abnormal adenopathy. 5 18 gauge core biopsies obtained. Samples placed in saline. Needle  removed. Postprocedure imaging demonstrates no hemorrhage or hematoma. Patient tolerated the biopsy well. No Complication. IMPRESSION: Successful ultrasound right inguinal adenopathy 18 gauge core biopsies Electronically Signed   By: Jerilynn Mages.  Shick M.D.   On: 09/20/2016 12:50    Assessment: 76 y.o. [facility] woman presenting with headaches, dizziness, and vomiting 09/18/2016, initial noncontrasted CT showing a large right parieto-occipital hypodensity, status post inguinal node biopsy 09/20/2016 with results pending  (1) status post right lumpectomy 2010 in [place], followed by radiation; no chemotherapy or anti-estrogens  METASTATIC DISEASE: August 2018 (2) brain MRI 09/19/2016 shows 3 brain masses the largest measuring 6.4 cm, involving right cerebrum, cerebellum, and left  parietal cortex; CT of the chest abdomen and pelvis 09/19/2016 shows multiple bilateral pulmonary nodules, small bilateral effusions, mediastinal, subcarinal and aortopulmonary window adenopathy, no liver or bone involvement             (a) right inguinal lymph node core biopsy 09/20/2016, results pending  (b) CA 27-29 is informative  Plan:  I discussed the tumor marker results with Izora Gala and her daughter. The CA 27-29 indicates we are dealing with breast cancer--it gets [place] at least that far. Presumably the brain lesions are the same (not a primary CNS malignancy).  The inguinal LN Bx results should be out tomorrow. If they are nondiagnostic she will need biopsy of some other lesion as we need a prognostic profile (estrogen receptor, HER-2 studies) to be able to plan systemic treatment.  We will be discussing ehr cxase tomorrow at 7 AM in the CNS tumor board. I will let her know the plan for the brain lesions (surgery? Radiation alone? What type of radiation?)  Appreciate your care to this patient!   Chauncey Cruel, MD 09/22/2016  10:44 AM Medical Oncology and Hematology [facility] [address] [place], [place] [address] Tel. [phone]    Fax. [phone]

## 2016-09-22 NOTE — Progress Notes (Signed)
Pt's HR has remained in 40s-50s this shift when sleeping. HR has dropped in the 30s several times but has not sustained. HR increased to 58 when she woke up. BP stable, 143/79. Pt asymptomatic. On-call MD paged to be made aware. Will continue to monitor closely.

## 2016-09-22 NOTE — Progress Notes (Signed)
PROGRESS NOTE  Deanna Washington  JGG:[id number] DOB: [birth year] DOA: 09/18/2016 PCP: No primary care provider on file.  Brief Narrative:   The patient is a 76 year old female with history of breast cancer status post lumpectomy and radiation 8 years ago who has been in remission and asthma who presented with progressive headache, nausea and vomiting. CT of the head demonstrated a large right parieto-occipital mass like hypodensity insistent with a tumor or metastases with 3 mm of right to left midline shift.  She was admitted and started on Decadron. MRI confirmed a 6 x 5 x 6.4 cm heterogeneous enhancing mass in the posterior right cerebrum spinning but the parietal and occipital lobes with scattered cystic spaces and blood products. There is local mass effect and mild vasogenic edema. There are also 2 additional masses a 10 mm left cerebellar mass and a 3 mm left parietal cortex mass suggestive of metastatic disease. In addition, she had punctate areas of cortical diffusion hyperintensity in the left occipital lobe consistent with small acute infarcts with a subacute ischemia in the upper right cerebellum.  Dr. Saintclair Halsted from neurosurgery reviewed the CT and MRI and felt that this might represent a high-grade glioma or glioblastoma. He increased the patient's Decadron to 10 mg IV every 6 hours and started Keppra for seizure prophylaxis.  CT of the chest/abdomen and pelvis demonstrates numerous lung nodules and mediastinal/hilar lymphadenopathy suggestive of possible prior lung CA vs. Recurrence of her breast cancer.  Her CA 27.29 is elevated suggestive of recurrent breast cancer.  She had a biopsy of her right inguinal lymph node and pathology is pending.  Her case will be discussed at tumor board on Monday.  In the meantime, EP is monitoring for significant pauses while on amiodarone as patient may need PPM insertion for tachy-brady syndrome.    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  Nausea and vomiting have improved -  CEA wnl -  CA 27.29 elevated suggestive of primary breast cancer -  Currently on Decadron 10 mg IV every 6 hours, continue at current dose per NSGY -  Continue Keppra 500 mg twice a day -  Right inguinal lymph node biopsy on 8/24, pathology pending -  If right inguinal lymph node is nondiagnostic, the next step would be pulmonology for bronch and biopsy -  Oncology consultation:  Dr. Jana Hakim -  XRT Onc:  Dr. Lisbeth Renshaw  -  NSGY:  Dr. Saintclair Halsted -  Case will be discussed at tumor board on Monday (tomorrow)  Acute and subacute embolic cerebral infarcts, likely due to a-fib (new diagnosis as of 8/23) -  ECHO with bubble, preserved EF, grade 1 DD, moderate TR -  Carotid duplex:  Findings suggest 1-39% internal carotid artery stenosis bilaterally. -  A1c 5.4 -  LDL 87 -  PT/OT/SLP:  Home health PT, cane, 24 hour supervision -  Ongoing speech/congition therapy   Tachy-brady syndrome with pauses up to 3 seconds.  CHADs2vasc = 5.  Asymptomatic sinus bradycardia down to 30s on telemetry overnight, but HR increases with exertion/movement.  No a-fib in last several days (since starting amio) -  TSH 0.43 and troponins negative -  Cardiology and EP consults appreciated -  Amiodarone continued  -  Consider lovenox/warfarin post-surgery   RLE edema -  Duplex RLE:  No DVT.  Incidentally found a vascularized hypoechoic homogenous area of the right groin measuring 2.8cm  HTN, blood pressures mildly elevated  - Continue hydrochlorothiazide and losartan  Asthma,  stable, continue Dulera   DVT prophylaxis:  Lovenox Code Status:  Full code Family Communication:  Patient and her daughter  Disposition Plan:  Tumor board will meet on Monday.  If biopsy nondiagnostic, will need bronch with biopsy.  Tolerating amio without a-fib.  PPM insertion deferred for now.    Consultants:   Neurosurgery  Oncology  Radiation oncology  Cardiology  EP  Procedures:  CT head MRI brain CT chest/ab/pelvis Right inguinal lymph node biopsy  Antimicrobials:  Anti-infectives    None       Subjective:  Still having a mild headache this point. Denies nausea or vomiting. Denies lightheadedness, chest pains, palpitations. Denies focal numbness, tingling, weakness, confusion or slurred speech. She is not sleeping very well.  Objective: Vitals:   09/22/16 0254 09/22/16 0532 09/22/16 0913 09/22/16 1353  BP: (!) 143/79 (!) 142/79  124/75  Pulse: (!) 48 (!) 52  (!) 58  Resp:  18  18  Temp:  97.6 F (36.4 C)  98.1 F (36.7 C)  TempSrc:  Oral  Oral  SpO2:  97% 98% 96%  Weight:      Height:        Intake/Output Summary (Last 24 hours) at 09/22/16 1557 Last data filed at 09/21/16 1800  Gross per 24 hour  Intake              200 ml  Output              700 ml  Net             -500 ml   Filed Weights   09/18/16 1546 09/21/16 0515  Weight: 67.1 kg (148 lb) 76.7 kg (169 lb 1.5 oz)    Examination:  General exam:  Adult Female.  No acute distress.  HEENT:  NCAT, MMM Respiratory system: Clear to auscultation bilaterally Cardiovascular system: Bradycardic, regular rhythm, normal S1/S2, no gallops Gastrointestinal system: Normal active bowel sounds, soft, nondistended, nontender. MSK:  Normal tone and bulk, 2+ right lower extremity edema, no left lower extremity edema Neuro:  Cranial nerve's 2 through 12 grossly intact, strength 5 out of 5 throughout, sensation intact to light touch, dysmetria of the left upper and lower extremities may be marginally  improved today.  Data Reviewed: I have personally reviewed following labs and imaging studies  CBC:  Recent Labs Lab 09/18/16 1659 09/19/16 0319 09/20/16 0253 09/21/16 0309 09/22/16 0455  WBC 10.8* 11.1* 9.5 11.3* 12.1*  HGB 14.9 13.7 13.6 13.5 14.4  HCT 43.3 40.6 40.4 39.6 42.9  MCV 93.5 96.0 95.1 94.7 95.3  PLT 286 261 297 301 563   Basic Metabolic Panel:  Recent Labs Lab 09/18/16 1659 09/19/16 0319 09/20/16 0253 09/21/16 0309 09/22/16 0455  NA 138 138 142 138 140  K 3.6 3.8 4.3 4.0 4.2  CL 103 105 108 105 104  CO2 26 27 28 26 30   GLUCOSE 112* 126* 139* 146* 151*  BUN 12 12 13 18 17   CREATININE 0.74 0.78 0.67 0.64 0.69  CALCIUM 9.6 8.9 9.3 8.9 9.1  MG  --   --  1.9 1.9 1.9   GFR: Estimated Creatinine Clearance: 67.2 mL/min (by C-G formula based on SCr of 0.69 mg/dL). Liver Function Tests:  Recent Labs Lab 09/19/16 0319  AST 21  ALT 18  ALKPHOS 61  BILITOT 0.6  PROT 6.3*  ALBUMIN 3.7    Recent Labs Lab 09/19/16 0319  LIPASE 29   No results for input(s): AMMONIA in the last 168 hours. Coagulation Profile:  Recent Labs Lab 09/20/16 0253  INR 1.03   Cardiac Enzymes:  Recent Labs Lab 09/19/16 1710 09/19/16 2302 09/20/16 0253  TROPONINI <0.03 <0.03 <0.03   BNP (last 3 results) No results for input(s): PROBNP in the last 8760 hours. HbA1C:  Recent Labs  09/20/16 0253  HGBA1C 5.4   CBG:  Recent Labs Lab 09/18/16 1711 09/19/16 0817 09/20/16 0858 09/21/16 0748 09/22/16 0755  GLUCAP 107* 116* 124* 172* 145*   Lipid Profile:  Recent Labs  09/20/16 0253  CHOL 157  HDL 52  LDLCALC 87  TRIG 91  CHOLHDL 3.0   Thyroid Function Tests: No results for input(s): TSH, T4TOTAL, FREET4, T3FREE, THYROIDAB in the last 72 hours. Anemia Panel: No results for input(s): VITAMINB12, FOLATE, FERRITIN, TIBC, IRON, RETICCTPCT in the last 72 hours. Urine analysis:    Component Value Date/Time   COLORURINE AMBER (A) 09/18/2016 1659    APPEARANCEUR HAZY (A) 09/18/2016 1659   LABSPEC 1.027 09/18/2016 1659   PHURINE 5.0 09/18/2016 1659   GLUCOSEU NEGATIVE 09/18/2016 1659   HGBUR SMALL (A) 09/18/2016 1659   BILIRUBINUR NEGATIVE 09/18/2016 1659   KETONESUR 80 (A) 09/18/2016 1659   PROTEINUR 30 (A) 09/18/2016 1659   NITRITE NEGATIVE 09/18/2016 1659   LEUKOCYTESUR MODERATE (A) 09/18/2016 1659   Sepsis Labs: @LABRCNTIP (procalcitonin:4,lacticidven:4)  ) Recent Results (from the past 240 hour(s))  Rapid strep screen     Status: None   Collection Time: 09/18/16  5:20 PM  Result Value Ref Range Status   Streptococcus, Group A Screen (Direct) NEGATIVE NEGATIVE Final    Comment: (NOTE) A Rapid Antigen test may result negative if the antigen level in the sample is below the detection level of this test. The FDA has not cleared this test as a stand-alone test therefore the rapid antigen negative result has reflexed to a Group A Strep culture.   Culture, group A strep     Status: None   Collection Time: 09/18/16  5:20 PM  Result Value Ref Range Status   Specimen Description THROAT  Final   Special Requests NONE Reflexed from [id number]  Final   Culture   Final    NO GROUP A STREP (S.PYOGENES) ISOLATED Performed at [facility], [address], [place], [place] [address]    Report Status 09/21/2016 FINAL  Final  MRSA PCR Screening     Status: None   Collection Time: 09/19/16  2:55 AM  Result Value Ref Range Status   MRSA by PCR NEGATIVE NEGATIVE Final    Comment:        The GeneXpert MRSA Assay (FDA approved for NASAL specimens only), is one component of a comprehensive MRSA colonization surveillance program. It is not intended to diagnose MRSA infection nor to guide or monitor treatment for MRSA infections.       Radiology Studies: No results found.   Scheduled Meds: . amiodarone  200 mg Oral BID  . dexamethasone  10 mg Intravenous Q6H  . enoxaparin (LOVENOX) injection  40 mg Subcutaneous Q24H  .  losartan  50 mg Oral  Daily   And  . hydrochlorothiazide  12.5 mg Oral Daily  . levETIRAcetam  500 mg Oral BID  . mometasone-formoterol  2 puff Inhalation BID  . pantoprazole  40 mg Oral Daily   Continuous Infusions:    LOS: 3 days    Time spent: 30 min    Janece Canterbury, MD [facility] Pager [phone]  If 7PM-7AM, please contact night-coverage [email or web address] Password [facility] 09/22/2016, 3:57 PM

## 2016-09-23 LAB — BASIC METABOLIC PANEL
Anion gap: 7 (ref 5–15)
BUN: 23 mg/dL — ABNORMAL HIGH (ref 6–20)
CALCIUM: 8.7 mg/dL — AB (ref 8.9–10.3)
CHLORIDE: 105 mmol/L (ref 101–111)
CO2: 26 mmol/L (ref 22–32)
CREATININE: 0.72 mg/dL (ref 0.44–1.00)
GFR calc non Af Amer: >60 mL/min (ref >60)
Glucose, Bld: 134 mg/dL — ABNORMAL HIGH (ref 65–99)
Potassium: 3.9 mmol/L (ref 3.5–5.1)
Sodium: 138 mmol/L (ref 135–145)

## 2016-09-23 LAB — CBC
HCT: 41.7 % (ref 36.0–46.0)
Hemoglobin: 14.2 g/dL (ref 12.0–15.0)
MCH: 31.8 pg (ref 26.0–34.0)
MCHC: 34.1 g/dL (ref 30.0–36.0)
MCV: 93.3 fL (ref 78.0–100.0)
PLATELETS: 311 10*3/uL (ref 150–400)
RBC: 4.47 MIL/uL (ref 3.87–5.11)
RDW: 12.5 % (ref 11.5–15.5)
WBC: 12.1 10*3/uL — ABNORMAL HIGH (ref 4.0–10.5)

## 2016-09-23 LAB — GLUCOSE, CAPILLARY: Glucose-Capillary: 123 mg/dL — ABNORMAL HIGH (ref 65–99)

## 2016-09-23 LAB — MAGNESIUM: MAGNESIUM: 2.1 mg/dL (ref 1.7–2.4)

## 2016-09-23 MED ORDER — AMIODARONE HCL 100 MG PO TABS
100.0000 mg | ORAL_TABLET | Freq: Two times a day (BID) | ORAL | Status: DC
  Administered 2016-09-24: 100 mg via ORAL
  Filled 2016-09-23: qty 1

## 2016-09-23 MED ORDER — AMIODARONE HCL 100 MG PO TABS: 100.0000 mg | ORAL_TABLET | Freq: Two times a day (BID) | ORAL | 0 refills | Status: DC

## 2016-09-23 MED ORDER — HYDROCHLOROTHIAZIDE 12.5 MG PO CAPS
12.5000 mg | ORAL_CAPSULE | Freq: Every day | ORAL | Status: DC
  Administered 2016-09-24: 12.5 mg via ORAL
  Filled 2016-09-23: qty 1

## 2016-09-23 MED ORDER — PANTOPRAZOLE SODIUM 40 MG PO TBEC: 40.0000 mg | DELAYED_RELEASE_TABLET | Freq: Every day | ORAL | 0 refills | Status: AC

## 2016-09-23 MED ORDER — LEVETIRACETAM 500 MG PO TABS: 500.0000 mg | ORAL_TABLET | Freq: Two times a day (BID) | ORAL | 0 refills | Status: DC

## 2016-09-23 MED ORDER — LOSARTAN POTASSIUM 25 MG PO TABS
25.0000 mg | ORAL_TABLET | Freq: Every day | ORAL | Status: DC
  Administered 2016-09-24: 25 mg via ORAL
  Filled 2016-09-23: qty 1

## 2016-09-23 MED ORDER — ATORVASTATIN CALCIUM 20 MG PO TABS: 20.0000 mg | ORAL_TABLET | Freq: Every day | ORAL | 0 refills | Status: AC

## 2016-09-23 MED ORDER — DEXAMETHASONE 4 MG PO TABS: 10.0000 mg | ORAL_TABLET | Freq: Four times a day (QID) | ORAL | 0 refills | Status: DC

## 2016-09-23 NOTE — Progress Notes (Signed)
Progress Note  Patient Name: Deanna Washington Date of Encounter: 09/23/2016  Primary Cardiologist: Dr. Oval Linsey new  Subjective   No complaints, no chest pain and no SOB.  No lightheadedness or dizziness with ambulation despite slow HR.  Marland Kitchen  Anxious to hear from oncology   Inpatient Medications    Scheduled Meds: . amiodarone  200 mg Oral BID  . dexamethasone  10 mg Intravenous Q6H  . enoxaparin (LOVENOX) injection  40 mg Subcutaneous Q24H  . losartan  50 mg Oral Daily   And  . hydrochlorothiazide  12.5 mg Oral Daily  . levETIRAcetam  500 mg Oral BID  . mometasone-formoterol  2 puff Inhalation BID  . pantoprazole  40 mg Oral Daily   Continuous Infusions:  PRN Meds: acetaminophen, albuterol, diphenhydrAMINE, morphine injection, ondansetron (ZOFRAN) IV   Vital Signs    Vitals:   09/22/16 2249 09/22/16 2307 09/23/16 0430 09/23/16 0845  BP:  125/75 (!) 144/76   Pulse: (!) 48 (!) 52 (!) 55   Resp:  17 16   Temp:  98 F (36.7 C) 97.6 F (36.4 C)   TempSrc:  Oral Oral   SpO2:  94% 96% 94%  Weight:      Height:        Intake/Output Summary (Last 24 hours) at 09/23/16 0943 Last data filed at 09/23/16 0222  Gross per 24 hour  Intake             1080 ml  Output                0 ml  Net             1080 ml   Filed Weights   09/18/16 1546 09/21/16 0515  Weight: 148 lb (67.1 kg) 169 lb 1.5 oz (76.7 kg)    Telemetry    SB with PVCs and one episode of brief ST HR in 40-50s  - Personally Reviewed  ECG    No new - Personally Reviewed  Physical Exam   GEN: No acute distress.   Neck: No JVD Cardiac: RRR, no murmurs, rubs, or gallops.  Respiratory: Clear to auscultation bilaterally. GI: Soft, nontender, non-distended  MS: + chronic Rt lower ext edema none on Lt; No deformity. Neuro:  Nonfocal  Psych: Normal affect   Labs    Chemistry Recent Labs Lab 09/19/16 0319  09/21/16 0309 09/22/16 0455 09/23/16 0418  NA 138  < > 138 140 138  K 3.8  < > 4.0 4.2  3.9  CL 105  < > 105 104 105  CO2 27  < > 26 30 26   GLUCOSE 126*  < > 146* 151* 134*  BUN 12  < > 18 17 23*  CREATININE 0.78  < > 0.64 0.69 0.72  CALCIUM 8.9  < > 8.9 9.1 8.7*  PROT 6.3*  --   --   --   --   ALBUMIN 3.7  --   --   --   --   AST 21  --   --   --   --   ALT 18  --   --   --   --   ALKPHOS 61  --   --   --   --   BILITOT 0.6  --   --   --   --   GFRNONAA >60  < > >60 >60 >60  GFRAA >60  < > >60 >60 >60  ANIONGAP 6  < >  7 6 7   < > = values in this interval not displayed.   Hematology Recent Labs Lab 09/21/16 0309 09/22/16 0455 09/23/16 0418  WBC 11.3* 12.1* 12.1*  RBC 4.18 4.50 4.47  HGB 13.5 14.4 14.2  HCT 39.6 42.9 41.7  MCV 94.7 95.3 93.3  MCH 32.3 32.0 31.8  MCHC 34.1 33.6 34.1  RDW 12.3 12.4 12.5  PLT 301 306 311    Cardiac Enzymes Recent Labs Lab 09/19/16 1710 09/19/16 2302 09/20/16 0253  TROPONINI <0.03 <0.03 <0.03    Recent Labs Lab 09/18/16 1727  TROPIPOC 0.00     BNPNo results for input(s): BNP, PROBNP in the last 168 hours.   DDimer No results for input(s): DDIMER in the last 168 hours.   Radiology    No results found.  Cardiac Studies   Echo 09/20/16 Study Conclusions  - Left ventricle: The cavity size was normal. There was mild   concentric hypertrophy. Systolic function was normal. The   estimated ejection fraction was in the range of 60% to 65%. Wall   motion was normal; there were no regional wall motion   abnormalities. Doppler parameters are consistent with abnormal   left ventricular relaxation (grade 1 diastolic dysfunction).   There was no evidence of elevated ventricular filling pressure by   Doppler parameters. - Aortic valve: There was mild regurgitation. - Aortic root: The aortic root was normal in size. - Mitral valve: There was trivial regurgitation. - Left atrium: The atrium was normal in size. - Right atrium: The atrium was mildly dilated. - Tricuspid valve: There was moderate regurgitation. -  Pulmonic valve: There was no regurgitation. - Pulmonary arteries: Systolic pressure was moderately increased.   PA peak pressure: 45 mm Hg (S). - Inferior vena cava: The vessel was dilated. The respirophasic   diameter changes were blunted (< 50%), consistent with elevated   central venous pressure. - Pericardium, extracardiac: A trivial pericardial effusion was   identified posterior to the heart. Features were not consistent   with tamponade physiology.  Impressions:  - No cardiac source of emboli was indentified.  Patient Profile     76 y.o. 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    Assessment & Plan    Atrial fib now in SR to SB --HR in 40;s mid freq.   On amiodarone 200 BID but has been held once each day --- decrease dose Dr. Johnsie Cancel to see.  Not on anticoagulation, coumadin when neuro clears. With CVA and brain lesions.  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      Signed, Cecilie Kicks, NP  09/23/2016, 9:43 AM    Patient examined chart reviewed. Discussed with Dr Saintclair Halsted this am. MRI with "silent" ? Embolic CVA Waiting on biopsy results for cancer type and decision on neurosurgery. Will decrease amiodarone to 100 bid and no anticoagulation for now. Discussed with patient and daughters   Jenkins Rouge

## 2016-09-23 NOTE — Progress Notes (Signed)
Deanna Washington   DOB:[birth year]   MC#:[id number]   MOQ#:[id number]  Subjective:  Deanna Washington has had a frustarting day waiting to hear from rad onc's plans  and the LN biopsy, neither of which are yet available. She is very concerned about ging home-- she will not be able to do all she did before for her husband, who has a colostomy and suprapubic catheter neigther of which he cna manage on his own; daughters in room  Objective: middle aged White woman examined in recliner Vitals:   09/23/16 0845 09/23/16 1419  BP:  122/68  Pulse:  62  Resp:  18  Temp:  97.8 F (36.6 C)  SpO2: 94% 97%    Body mass index is 25.71 kg/m.  Intake/Output Summary (Last 24 hours) at 09/23/16 1738 Last data filed at 09/23/16 0222  Gross per 24 hour  Intake              450 ml  Output                0 ml  Net              450 ml    CBG (last 3)   Recent Labs  09/21/16 0748 09/22/16 0755 09/23/16 0759  GLUCAP 172* 145* 123*     Labs:  Lab Results  Component Value Date   WBC 12.1 (H) 09/23/2016   HGB 14.2 09/23/2016   HCT 41.7 09/23/2016   MCV 93.3 09/23/2016   PLT 311 09/23/2016    @LASTCHEMISTRY @  Urine Studies No results for input(s): UHGB, CRYS in the last 72 hours.  Invalid input(s): UACOL, UAPR, USPG, UPH, UTP, UGL, UKET, UBIL, UNIT, UROB, [place], UEPI, UWBC, Deanna Washington [place], [place], [place]  Basic Metabolic Panel:  Recent Labs Lab 09/19/16 0319 09/20/16 0253 09/21/16 0309 09/22/16 0455 09/23/16 0418  NA 138 142 138 140 138  K 3.8 4.3 4.0 4.2 3.9  CL 105 108 105 104 105  CO2 27 28 26 30 26   GLUCOSE 126* 139* 146* 151* 134*  BUN 12 13 18 17  23*  CREATININE 0.78 0.67 0.64 0.69 0.72  CALCIUM 8.9 9.3 8.9 9.1 8.7*  MG  --  1.9 1.9 1.9 2.1   GFR Estimated Creatinine Clearance: 67.2 mL/min (by C-G formula based on SCr of 0.72 mg/dL). Liver Function Tests:  Recent Labs Lab 09/19/16 0319  AST 21  ALT 18  ALKPHOS 61  BILITOT 0.6  PROT 6.3*  ALBUMIN 3.7    Recent Labs Lab  09/19/16 0319  LIPASE 29   No results for input(s): AMMONIA in the last 168 hours. Coagulation profile  Recent Labs Lab 09/20/16 0253  INR 1.03    CBC:  Recent Labs Lab 09/19/16 0319 09/20/16 0253 09/21/16 0309 09/22/16 0455 09/23/16 0418  WBC 11.1* 9.5 11.3* 12.1* 12.1*  HGB 13.7 13.6 13.5 14.4 14.2  HCT 40.6 40.4 39.6 42.9 41.7  MCV 96.0 95.1 94.7 95.3 93.3  PLT 261 297 301 306 311   Cardiac Enzymes:  Recent Labs Lab 09/19/16 1710 09/19/16 2302 09/20/16 0253  TROPONINI <0.03 <0.03 <0.03   BNP: Invalid input(s): POCBNP CBG:  Recent Labs Lab 09/19/16 0817 09/20/16 0858 09/21/16 0748 09/22/16 0755 09/23/16 0759  GLUCAP 116* 124* 172* 145* 123*   D-Dimer No results for input(s): DDIMER in the last 72 hours. Hgb A1c No results for input(s): HGBA1C in the last 72 hours. Lipid Profile No results for input(s): CHOL, HDL, LDLCALC, TRIG, CHOLHDL, LDLDIRECT in the last  72 hours. Thyroid function studies No results for input(s): TSH, T4TOTAL, T3FREE, THYROIDAB in the last 72 hours.  Invalid input(s): FREET3 Anemia work up No results for input(s): VITAMINB12, FOLATE, FERRITIN, TIBC, IRON, RETICCTPCT in the last 72 hours. Microbiology Recent Results (from the past 240 hour(s))  Rapid strep screen     Status: None   Collection Time: 09/18/16  5:20 PM  Result Value Ref Range Status   Streptococcus, Group A Screen (Direct) NEGATIVE NEGATIVE Final    Comment: (NOTE) A Rapid Antigen test may result negative if the antigen level in the sample is below the detection level of this test. The FDA has not cleared this test as a stand-alone test therefore the rapid antigen negative result has reflexed to a Group A Strep culture.   Culture, group A strep     Status: None   Collection Time: 09/18/16  5:20 PM  Result Value Ref Range Status   Specimen Description THROAT  Final   Special Requests NONE Reflexed from [id number]  Final   Culture   Final    NO GROUP A STREP  (S.PYOGENES) ISOLATED Performed at [facility], [address], [place], [place] [address]    Report Status 09/21/2016 FINAL  Final  MRSA PCR Screening     Status: None   Collection Time: 09/19/16  2:55 AM  Result Value Ref Range Status   MRSA by PCR NEGATIVE NEGATIVE Final    Comment:        The GeneXpert MRSA Assay (FDA approved for NASAL specimens only), is one component of a comprehensive MRSA colonization surveillance program. It is not intended to diagnose MRSA infection nor to guide or monitor treatment for MRSA infections.       Studies:  Dg Chest 2 View  Result Date: 09/18/2016 CLINICAL DATA:  Generalized body aches, fever, chills, nausea and vomiting for 3 days. EXAM: CHEST  2 VIEW COMPARISON:  None. FINDINGS: There is cardiomegaly and vascular congestion. No consolidative process or pneumothorax. Trace bilateral pleural effusions noted. No acute bony abnormality. IMPRESSION: Cardiomegaly and pulmonary vascular congestion. Trace bilateral pleural effusions. Electronically Signed   By: Inge Rise M.D.   On: 09/18/2016 19:53   Ct Head Wo Contrast  Result Date: 09/18/2016 CLINICAL DATA:  Generalized body aches, fever, chills, nausea and vomiting for 3 days. RIGHT eye and ear pain. EXAM: CT HEAD WITHOUT CONTRAST TECHNIQUE: Contiguous axial images were obtained from the base of the skull through the vertex without intravenous contrast. COMPARISON:  None. FINDINGS: BRAIN: 4.3 x 7.6 cm rounded hypodensity with heterogeneous component RIGHT prop occipital lobe effacing the RIGHT atrium. Surrounding apparent vasogenic edema. Local mass-effect, 3 mm RIGHT to LEFT midline shift. No intraparenchymal hemorrhage. No hydrocephalus. No abnormal extra-axial fluid collections. VASCULAR: Moderate calcific atherosclerosis of the carotid siphons. SKULL: No skull fracture. No significant scalp soft tissue swelling. SINUSES/ORBITS: Trace paranasal sinus mucosal thickening. Mastoid air  cells are well aerated. The included ocular globes and orbital contents are non-suspicious. Status post bilateral ocular lens implants. OTHER: None. IMPRESSION: 1. Large RIGHT parieto-occipital masslike hypodensity, differential diagnosis includes tumor/metastasis, cerebritis or less likely subacute MCA/ posterior watershed territory infarct. 3 mm RIGHT to LEFT midline shift. Recommend MRI of the brain with and without contrast. Acute findings discussed with and reconfirmed by [facility] NANAVATI on 09/18/2016 at 10:20 pm. Electronically Signed   By: Elon Alas M.D.   On: 09/18/2016 22:23   Ct Chest W Contrast  Result Date: 09/19/2016 CLINICAL DATA:  Remote history of breast cancer with recent brain MRI demonstrating metastatic brain lesions. Restaging CT scans. EXAM: CT CHEST, ABDOMEN, AND PELVIS WITH CONTRAST TECHNIQUE: Multidetector CT imaging of the chest, abdomen and pelvis was performed following the standard protocol during bolus administration of intravenous contrast. CONTRAST:  110m ISOVUE-300 IOPAMIDOL (ISOVUE-300) INJECTION 61% COMPARISON:  None. FINDINGS: CT CHEST FINDINGS Cardiovascular: The heart is normal in size. No pericardial effusion. Mild tortuosity and calcification of the thoracic aorta but no aneurysm or dissection. The branch vessels are patent. Scattered coronary artery calcifications are noted. Mediastinum/Nodes: Necrotic appearing mediastinal lymphadenopathy. 3.3 x 2.1 cm nodal mass in the aorticopulmonary window on image number 25. There is also cystic/necrotic subcarinal lymphadenopathy with maximum short axis diameter of 25 mm on image number 33. Other scattered smaller lymph nodes are noted including a 9 mm right infrahilar lymph node on image number 38. The esophagus is grossly normal. Lungs/Pleura: Diffuse pulmonary metastatic disease with numerous bilateral pulmonary nodules. Index lesion in the right middle lobe on image number 115 measures 12 x 11 mm. Left upper lobe  nodule on image number 85 measures 7 x 5 mm. Right lower lobe lesion just above the right hemidiaphragm on image number 124 measures 14.5 x 12.5 mm. Large necrotic lesion in the as ago esophageal recess could be a necrotic metastatic lung lesion or a necrotic lymph node. It measures 4.2 by 3.0 cm. Small bilateral pleural effusions and bibasilar atelectasis. Chest wall/ Musculoskeletal: Surgical clips surrounding a cystic area in the right breast, likely small liquified hematoma related to a prior lumpectomy. No obvious breast masses. No axillary lymphadenopathy. No supraclavicular lymphadenopathy. The thyroid gland appears normal. No definite metastatic bone disease. There is a small sclerotic lesion noted in the T1 vertebral body and also in the T3 vertebral body. I do not however see any other definite lesions to suggest metastatic disease. CT ABDOMEN PELVIS FINDINGS Hepatobiliary: No definite findings for hepatic metastatic disease. Layering high attenuation material in the gallbladder could be sludge or stones. No findings for acute cholecystitis. Pancreas: No mass, inflammation or ductal dilatation. Spleen: Normal size.  No focal lesions. Adrenals/Urinary Tract: The adrenal glands are unremarkable. Somewhat irregular appearing left upper pole cystic lesion measures less than 20 Hounsfield units and is most likely a cyst. No worrisome renal lesions or hydronephrosis. Stomach/Bowel: The stomach, duodenum, small bowel and colon are grossly normal. No acute inflammatory process, mass lesions or obstructive findings. Colonic diverticulosis without findings for acute diverticulitis. Vascular/Lymphatic: Moderate atherosclerotic calcifications involving a tortuous abdominal aorta but no focal aneurysm or dissection. The branch vessels are patent. The major venous structures are patent. 11.5 mm gastrohepatic ligament lymph node is noted on image number 60. There is also a low-attenuation retrocrural lymph node measures  11 mm on image number 59. No mesenteric or retroperitoneal mass or lymphadenopathy. Reproductive: The enlarged fibroid uterus. The ovaries appear normal. Other: No pelvic lymphadenopathy. There are enlarged right inguinal lymph nodes. Suspect postoperative changes in the right inguinal area. Musculoskeletal: No obvious metastatic bone disease. IMPRESSION: 1. Pulmonary metastatic disease and fairly extensive necrotic appearing mediastinal and hilar lymphadenopathy. 2. No obvious breast masses and no supraclavicular or axillary adenopathy. 3. No obvious abdominal/pelvic metastatic disease or osseous metastatic disease. A few small upper thoracic sclerotic bone lesions require observation. 4. Enlarged fibroid uterus. 5. Enlarged right inguinal lymph nodes and possible prior postoperative changes. Electronically Signed   By: PMarijo SanesM.D.   On: 09/19/2016 14:45   Mr BJeri CosWWP  Contrast  Result Date: 09/19/2016 CLINICAL DATA:  Abnormal head CT. Headaches. History breast cancer. EXAM: MRI HEAD WITHOUT AND WITH CONTRAST TECHNIQUE: Multiplanar, multiecho pulse sequences of the brain and surrounding structures were obtained without and with intravenous contrast. CONTRAST:  37m MULTIHANCE GADOBENATE DIMEGLUMINE 529 MG/ML IV SOLN COMPARISON:  Head CT from yesterday FINDINGS: Brain: Large heterogeneously enhancing mass in the posterior right cerebrum, spanning the parietal and occipital lobes and measuring up to 6 x 5 x 6.4 cm. This mass contains scattered cystic spaces and probable blood products. The overlying cortex is not visible and there is a thin line of overlying dural thickening. There is local mass effect, mild for the size of tumor. Mild vasogenic edema. 10 mm left cerebellar and 3 mm left parietal cortex enhancing masses, overall pattern suggesting metastatic disease. Punctate areas of cortical diffusion hyperintensity in the left occipital pole consistent with small infarcts. Mild restricted diffusion  seen in the right cerebellum, suggesting subacute ischemia. Diffusion within the right-sided mass from blood products or other paramagnetic substance. There is scattered FLAIR hyperintensities in the cerebral white matter consistent with chronic small vessel ischemia. Vascular: Major flow voids are preserved. Skull and upper cervical spine: No aggressive marrow lesion. Right parietal bone lesion has intrinsic T1 hyperintensity most consistent with hemangioma. Sinuses/Orbits: Bilateral cataract resection. Mild chronic sinusitis. IMPRESSION: 1. Metastatic pattern with 3 brain masses. The largest is in the posterior right cerebrum measuring up to 6.4 cm; local mass effect. The other lesions measure 1 cm in the left cerebellum and 3 mm in the left parietal cortex. 2. 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 Subacute ischemia in the upper right cerebellum. Electronically Signed   By: JMonte FantasiaM.D.   On: 09/19/2016 07:49   Ct Abdomen Pelvis W Contrast  Result Date: 09/19/2016 CLINICAL DATA:  Remote history of breast cancer with recent brain MRI demonstrating metastatic brain lesions. Restaging CT scans. EXAM: CT CHEST, ABDOMEN, AND PELVIS WITH CONTRAST TECHNIQUE: Multidetector CT imaging of the chest, abdomen and pelvis was performed following the standard protocol during bolus administration of intravenous contrast. CONTRAST:  1010mISOVUE-300 IOPAMIDOL (ISOVUE-300) INJECTION 61% COMPARISON:  None. FINDINGS: CT CHEST FINDINGS Cardiovascular: The heart is normal in size. No pericardial effusion. Mild tortuosity and calcification of the thoracic aorta but no aneurysm or dissection. The branch vessels are patent. Scattered coronary artery calcifications are noted. Mediastinum/Nodes: Necrotic appearing mediastinal lymphadenopathy. 3.3 x 2.1 cm nodal mass in the aorticopulmonary window on image number 25. There is also cystic/necrotic subcarinal lymphadenopathy with maximum short axis diameter of  25 mm on image number 33. Other scattered smaller lymph nodes are noted including a 9 mm right infrahilar lymph node on image number 38. The esophagus is grossly normal. Lungs/Pleura: Diffuse pulmonary metastatic disease with numerous bilateral pulmonary nodules. Index lesion in the right middle lobe on image number 115 measures 12 x 11 mm. Left upper lobe nodule on image number 85 measures 7 x 5 mm. Right lower lobe lesion just above the right hemidiaphragm on image number 124 measures 14.5 x 12.5 mm. Large necrotic lesion in the as ago esophageal recess could be a necrotic metastatic lung lesion or a necrotic lymph node. It measures 4.2 by 3.0 cm. Small bilateral pleural effusions and bibasilar atelectasis. Chest wall/ Musculoskeletal: Surgical clips surrounding a cystic area in the right breast, likely small liquified hematoma related to a prior lumpectomy. No obvious breast masses. No axillary lymphadenopathy. No supraclavicular lymphadenopathy. The thyroid gland  appears normal. No definite metastatic bone disease. There is a small sclerotic lesion noted in the T1 vertebral body and also in the T3 vertebral body. I do not however see any other definite lesions to suggest metastatic disease. CT ABDOMEN PELVIS FINDINGS Hepatobiliary: No definite findings for hepatic metastatic disease. Layering high attenuation material in the gallbladder could be sludge or stones. No findings for acute cholecystitis. Pancreas: No mass, inflammation or ductal dilatation. Spleen: Normal size.  No focal lesions. Adrenals/Urinary Tract: The adrenal glands are unremarkable. Somewhat irregular appearing left upper pole cystic lesion measures less than 20 Hounsfield units and is most likely a cyst. No worrisome renal lesions or hydronephrosis. Stomach/Bowel: The stomach, duodenum, small bowel and colon are grossly normal. No acute inflammatory process, mass lesions or obstructive findings. Colonic diverticulosis without findings for  acute diverticulitis. Vascular/Lymphatic: Moderate atherosclerotic calcifications involving a tortuous abdominal aorta but no focal aneurysm or dissection. The branch vessels are patent. The major venous structures are patent. 11.5 mm gastrohepatic ligament lymph node is noted on image number 60. There is also a low-attenuation retrocrural lymph node measures 11 mm on image number 59. No mesenteric or retroperitoneal mass or lymphadenopathy. Reproductive: The enlarged fibroid uterus. The ovaries appear normal. Other: No pelvic lymphadenopathy. There are enlarged right inguinal lymph nodes. Suspect postoperative changes in the right inguinal area. Musculoskeletal: No obvious metastatic bone disease. IMPRESSION: 1. Pulmonary metastatic disease and fairly extensive necrotic appearing mediastinal and hilar lymphadenopathy. 2. No obvious breast masses and no supraclavicular or axillary adenopathy. 3. No obvious abdominal/pelvic metastatic disease or osseous metastatic disease. A few small upper thoracic sclerotic bone lesions require observation. 4. Enlarged fibroid uterus. 5. Enlarged right inguinal lymph nodes and possible prior postoperative changes. Electronically Signed   By: Marijo Sanes M.D.   On: 09/19/2016 14:45   US Biopsy  Result Date: 09/20/2016 INDICATION: METASTATIC DISEASE, BRAIN METASTASES, INGUINAL ADENOPATHY, UNKNOWN PRIMARY EXAM: ULTRASOUND RIGHT INGUINAL ADENOPATHY 18 GAUGE CORE BIOPSY MEDICATIONS: 1% lidocaine local ANESTHESIA/SEDATION: Moderate (conscious) sedation was employed during this procedure. A total of Versed 2.0 mg and Fentanyl 100 mcg was administered intravenously. Moderate Sedation Time: 10 Minutes. The patient's level of consciousness and vital signs were monitored continuously by radiology nursing throughout the procedure under my direct supervision. FLUOROSCOPY TIME:  Fluoroscopy Time: None. COMPLICATIONS: None immediate. PROCEDURE: Informed written consent was obtained from the  patient after a thorough discussion of the procedural risks, benefits and alternatives. All questions were addressed. Maximal Sterile Barrier Technique was utilized including caps, mask, sterile gowns, sterile gloves, sterile drape, hand hygiene and skin antiseptic. A timeout was performed prior to the initiation of the procedure. Previous imaging reviewed. Preliminary ultrasound performed. Right inguinal abnormal adenopathy localized. Overlying skin marked. Under sterile conditions and local anesthesia, an 18 gauge core biopsy medial was advanced to the right inguinal abnormal adenopathy. 5 18 gauge core biopsies obtained. Samples placed in saline. Needle removed. Postprocedure imaging demonstrates no hemorrhage or hematoma. Patient tolerated the biopsy well. No Complication. IMPRESSION: Successful ultrasound right inguinal adenopathy 18 gauge core biopsies Electronically Signed   By: Jerilynn Mages.  Shick M.D.   On: 09/20/2016 12:50     Assessment: 76 y.o. [facility] woman presenting with headaches, dizziness, and vomiting 09/18/2016, initial noncontrasted CT showing a large right parieto-occipital hypodensity, status post inguinal node biopsy 09/20/2016 with results pending  (1) status post right lumpectomy 2010 in [place], followed by radiation; no chemotherapy or anti-estrogens  METASTATIC DISEASE: August 2018 (2) brain MRI 09/19/2016  shows 3 brain masses the largest measuring 6.4 cm, involving right cerebrum, cerebellum, and left parietal cortex; CT of the chest abdomen and pelvis 09/19/2016 shows multiple bilateral pulmonary nodules, small bilateral effusions, mediastinal, subcarinal and aortopulmonary window adenopathy, no liver or bone involvement             (a) right inguinal lymph node core biopsy 09/20/2016, results pending  (b) CA 27-29 is informative (this means we are dealing with breast cancer)  Plan:  Unfortunately the brain tumor meeting is [facility] this week and we will not  have a plan until then.  Furthermore the biopsy results are still not back. We know we are dealing with breast cancer because of the elevated CA 27-29, but we need to know if this is estrogen receptor positive and/or HER-2 positive so we can plan systemic treatment.  We discussed the difficult home situaiton in detail. The son at home has been helping and one of the daughters can come by and make sure the patient's husband's meds are right. Diamonique still has some balance issues and may benefit from in-home PT. We discussed the mechanics of radiation and the fact that we have many kinds of chemotherapy including by mouth if her cancer proves to be ER negative as expected.  Anticipate d/c in AM. I will follow as outpatient.  Appreciate your excellent help to this patient and her family!     Chauncey Cruel, MD 09/23/2016  5:38 PM Medical Oncology and Hematology [facility] [address] [place], [place] [address] Tel. [phone]    Fax. [phone]

## 2016-09-23 NOTE — Progress Notes (Addendum)
SLP Cancellation Note  Patient Details Name: Genowefa Morga MRN: [id number] DOB: Mar 06, 1940   Cancelled treatment:       Reason Eval/Treat Not Completed: Other (comment) (pt out of room at this time, will continue efforts)  Luanna Salk, [facility] SLP [phone]  Macario Golds 09/23/2016, 10:13 AM

## 2016-09-23 NOTE — Discharge Instructions (Signed)
Breast Cancer, Female Breast cancer is an abnormal growth of tissue (tumor) in the breast that is cancerous (malignant). Unlike noncancerous (benign) tumors, malignant tumors can spread to other parts of your body. The most common type of female breast cancer begins in the milk ducts (ductal carcinoma). Breast cancer is one of the most common types of cancer in women. What are the causes? The exact cause of female breast cancer is unknown. What increases the risk?  Age older than 36 years.  Family history of breast cancer.  Having the BRCA1 and BRCA2 genes.  Personal history of radiation exposure.  Obesity.  Menstrual periods that begin before age 36 years.  Menopause that begins after age 38 years.  Pregnant for the first time at the age of 14 years or older.  Using hormone therapy.  Drinking more than one alcoholic drink per day. What are the signs or symptoms?  A painless lump in your breast.  Changes in the size or shape of your breast.  Breast skin changes, such as puckering or dimpling.  Nipple abnormalities, such as scaling, crustiness, redness, or pulling in (retraction).  Nipple discharge that is bloody or clear. How is this diagnosed? Your health care provider will ask about your medical history. He or she may also perform a number of procedures, such as:  A physical exam. This will involve feeling the tissue around the breast and under the arms.  Taking a sample of nipple discharge. The sample will be examined under a microscope.  Breast X-rays (mammogram), breast ultrasound exams, or an MRI.  Taking a tissue sample (biopsy) from the breast. The sample will be examined under a microscope to look for cancer cells.  Your cancer will be staged to determine its severity and extent. Staging is a careful attempt to find out the size of the tumor, whether the cancer has spread, and if so, to what parts of the body. You may need to have more tests to determine the  stage of your cancer:  Stage 0--The tumor has not spread to other breast tissue.  Stage I--The cancer is only found in the breast. The tumor may be up to  in (2 cm) wide.  Stage II--The cancer has spread to nearby lymph nodes. The tumor may be up to 2 in (5 cm) wide.  Stage III--The cancer has spread to more distant lymph nodes. The tumor may be larger than 2 in (5 cm) wide.  Stage IV--The cancer has spread to other parts of the body, such as the bones, brain, liver, or lungs.  How is this treated? Depending on the type and stage, female breast cancer may be treated with one or more of the following therapies:  Surgery to remove just the tumor (lumpectomy) or the entire breast (mastectomy). Lymph nodes may also be removed.  Radiation therapy, which uses high-energy rays to kill cancer cells.  Chemotherapy, which is the use of drugs to kill cancer cells.  Hormone therapy, which involves taking medicine to adjust the hormone levels in your body. You may take medicine to decrease your estrogen levels. This can help stop cancer cells from growing.  Follow these instructions at home:  Take medicines only as directed by your health care provider.  Maintain a healthy diet.  Consider joining a support group. This may help you learn to cope with the stress of having breast cancer.  Keep all follow-up appointments as directed by your health care provider. Contact a health care provider if:  You have a sudden increase in pain.  You notice a new lump in either breast or under your arm.  You develop swelling in either arm or hand.  You lose weight without trying.  You have a fever.  You notice new fatigue or weakness. Get help right away if:  You have chest pain or trouble breathing.  You faint. This information is not intended to replace advice given to you by your health care provider. Make sure you discuss any questions you have with your health care provider. Document Released:  04/24/2005 Document Revised: 05/25/2015 Document Reviewed: 03/10/2013 Elsevier Interactive Patient Education  2017 [employer]  

## 2016-09-23 NOTE — Evaluation (Signed)
Occupational Therapy Evaluation Patient Details Name: Deanna Washington MRN: [id number] DOB: December 27, 1940 Today's Date: 09/23/2016    History of Present Illness Pt. is a 76 y.o. female with PMHx of breast cancer status post lumpectomy and radiation 8 years ago who has been in remission and asthma who presented to the hospital with progressive headache, nausea and vomiting. CT of the head demonstrated a large right parieto-occipital mass with 3 mm of right to left midline shift. MRI confirmed a 6 x 5 x 6.4 cm heterogeneous enhancing mass in the posterior right cerebrum. 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   Clinical Impression   Pt admitted with headache. Pt currently with functional limitations due to the deficits listed below (see OT Problem List).  Pt will benefit from skilled OT to increase their safety and independence with ADL and functional mobility for ADL to facilitate discharge to venue listed below.      Follow Up Recommendations  No OT follow up    Equipment Recommendations  None recommended by OT       Precautions / Restrictions Precautions Precautions: Fall      Mobility Bed Mobility               General bed mobility comments: pt in chair  Transfers Overall transfer level: Needs assistance Equipment used: None Transfers: Sit to/from Stand;Stand Pivot Transfers Sit to Stand: Min guard Stand pivot transfers: Min guard            Balance Overall balance assessment: Needs assistance Sitting-balance support: Feet supported Sitting balance-Leahy Scale: Good     Standing balance support: No upper extremity supported Standing balance-Leahy Scale: Good                 High Level Balance Comments: Pt reports increased dizziness when attempting to ambulate and look different directions            ADL either performed or assessed with  clinical judgement   ADL Overall ADL's : Needs assistance/impaired Eating/Feeding: Set up;Sitting   Grooming: Min guard   Upper Body Bathing: Set up;Sitting   Lower Body Bathing: Min guard;Sit to/from stand;Cueing for safety   Upper Body Dressing : Set up;Sitting   Lower Body Dressing: Min guard;Sit to/from stand;Cueing for safety   Toilet Transfer: Min guard   Toileting- Clothing Manipulation and Hygiene: Supervision/safety;Sit to/from stand         General ADL Comments: Noted edema in LLE. Mentioned to pt and daugthers speaking to MD regarding Lymphema treatment might be beneficial.       Vision Patient Visual Report: No change from baseline              Pertinent Vitals/Pain Pain Assessment: No/denies pain        Extremity/Trunk Assessment Upper Extremity Assessment Upper Extremity Assessment: Overall WFL for tasks assessed           Communication Communication Communication: No difficulties   Cognition Arousal/Alertness: Awake/alert Behavior During Therapy: WFL for tasks assessed/performed Overall Cognitive Status: Within Functional Limits for tasks assessed                                     General Comments   Pt is a caregiver. 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 prior to admittance to [facility]ital        OT Problem List: Decreased strength;Decreased activity tolerance;Impaired balance (sitting and/or standing)      OT Treatment/Interventions: Self-care/ADL  training;Patient/family education;DME and/or AE instruction    OT Goals(Current goals can be found in the care plan section) Acute Rehab OT Goals Patient Stated Goal: Pt would like to return home  OT Goal Formulation: With patient Time For Goal Achievement: 09/30/16  OT Frequency: Min 2X/week              AM-PAC PT "6 Clicks" Daily Activity     Outcome Measure Help from another person eating meals?: None Help from another person taking care of personal grooming?: None Help from another person toileting, which includes using toliet, bedpan, or urinal?: A Little Help from another person bathing (including washing, rinsing, drying)?: A Little Help from another person to put on and taking off regular upper body clothing?: None Help from another person to put on and taking off regular lower body clothing?: A Little 6 Click Score: 21   End of Session Nurse Communication: Mobility status  Activity Tolerance: Patient tolerated treatment well Patient left: in chair;with call bell/phone within reach;with family/visitor present  OT Visit Diagnosis: Unsteadiness on feet (R26.81)                Charges:  OT General Charges $OT Visit: 1 Visit OT Evaluation $OT Eval Moderate Complexity: 1 Mod G-Codes:     Kari Baars, OT [phone]  Payton Mccallum D 09/23/2016, 11:55 AM

## 2016-09-23 NOTE — Progress Notes (Signed)
PT Cancellation Note  Patient Details Name: Deanna Washington MRN: [id number] DOB: [birth year]   Cancelled Treatment:     pt amb in hallway with family a great distance and no need for any AD.     Rica Koyanagi  PTA [facility]  Acute  Rehab Pager      [phone]

## 2016-09-23 NOTE — Discharge Summary (Signed)
Physician Discharge Summary  Deanna Washington POE:[id number] DOB: 10-Nov-1940 DOA: 09/18/2016  PCP: No primary care provider on file.  Admit date: 09/18/2016 Discharge date: 09/23/2016  Admitted From: home  Disposition:  home  Recommendations for Outpatient Follow-up:  1.  Patient will be notified by the Oncology office regarding test results and further plans.  F/u with Dr. Jana Hakim on Thursday or Friday 2.  Continued dexamethasone which will need to be tapered  3.  EP to follow up regarding tachy-brady syndrome and amiodarone monitoring 4.  Eventually will need to start anticoagulation, defer to cardiology/oncology  Home Health:  PT  Equipment/Devices:  Cane  Discharge Condition:  Stable, improved CODE STATUS:  Full code  Diet recommendation:  regular   Brief/Interim Summary:   The patient is a 76 year old female with history of breast cancer status post lumpectomy and radiation 8 years ago who has been in remission and asthma who presented with progressive headache, nausea and vomiting. CT of the head demonstrated a large right parieto-occipital mass like hypodensity insistent with a tumor or metastases with 3 mm of right to left midline shift.  She was admitted and started on Decadron. MRI confirmed a 6 x 5 x 6.4 cm heterogeneous enhancing mass in the posterior right cerebrum spinning but the parietal and occipital lobes with scattered cystic spaces and blood products. There is local mass effect and mild vasogenic edema. There are also 2 additional masses a 10 mm left cerebellar mass and a 3 mm left parietal cortex mass suggestive of metastatic disease. In addition, she had punctate areas of cortical diffusion hyperintensity in the left occipital lobe consistent with small acute infarcts with a subacute ischemia in the upper right cerebellum.  Dr. Saintclair Halsted from neurosurgery reviewed the CT and MRI and felt that this might represent a high-grade glioma or glioblastoma. He increased the patient's  Decadron to 10 mg IV every 6 hours and started Keppra for seizure prophylaxis.  CT of the chest/abdomen and pelvis demonstrates numerous lung nodules and mediastinal/hilar lymphadenopathy suggestive of possible prior lung CA vs. Recurrence of her breast cancer.  Her CA 27.29 is elevated suggestive of recurrent breast cancer.  She had a biopsy of her right inguinal lymph node and pathology is pending.  Her case will be discussed at tumor board on Wednseday.  In the meantime, she was incidentally found to have sinus bradycardia with intermittent a-fib with RVR in the 120s.  Cardiology was consulted as well as EP for tachy-brady syndrome.  She was started on amiodarone and was tolerating 100mg  po BID at the time of discharge.    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  Nausea and vomiting have improved -  CEA wnl -  CA 27.29 elevated suggestive of primary breast cancer -  Decadron 10 mg four times daily and advised not to taper at the recommendation of NSGY pending decision about surgery.  Taper deferred to neurosurgery -  Started Keppra 500 mg twice a day -  Right inguinal lymph node biopsy on 8/24, pathology pending -  If right inguinal lymph  node is nondiagnostic, the next step would be pulmonology for bronch and biopsy -  Oncology consultation:  Dr. Jana Hakim -  XRT Onc:  Dr. Lisbeth Renshaw  -  NSGY:  Dr. Saintclair Halsted -  Case will be discussed at tumor board on Wednesday  Acute and subacute embolic cerebral infarcts, likely due to a-fib (new diagnosis as of 8/23) -  ECHO with bubble,  preserved EF, grade 1 DD, moderate TR -  Carotid duplex:  Findings suggest 1-39% internal carotid artery stenosis bilaterally. -  A1c 5.4 -  LDL 87, started statin -  PT/OT/SLP:  Home health PT, cane, 24 hour supervision -  Ongoing speech/congition therapy  -  No aspirin or anticoagulation prior to possible brain surgery  Tachy-brady syndrome with pauses up to 3 seconds.  CHADs2vasc = 5.  Asymptomatic sinus bradycardia down to 30s on telemetry overnight, but HR increases with exertion/movement.  No a-fib in last several days (since starting amio) -  TSH 0.43 and troponins negative -  Cardiology and EP consults appreciated -  Amiodarone reduced to 100mg  po BID -  Consider lovenox/warfarin post-surgery   RLE edema, chronic -  Duplex RLE:  No DVT.  Incidentally found a vascularized hypoechoic homogenous area of the right groin measuring 2.8cm  HTN, blood pressures mildly elevated  - Continued hydrochlorothiazide and losartan  Asthma, stable, continued [facility]  Discharge Instructions  Discharge Instructions    Call MD for:  difficulty breathing, headache or visual disturbances    Complete by:  As directed    Call MD for:  extreme fatigue    Complete by:  As directed    Call MD for:  hives    Complete by:  As directed    Call MD for:  persistant dizziness or light-headedness    Complete by:  As directed    Call MD for:  persistant nausea and vomiting    Complete by:  As directed    Call MD for:  severe uncontrolled pain    Complete by:  As directed    Call MD for:  temperature >100.4    Complete by:  As directed    Diet general    Complete by:  As directed    Increase activity slowly    Complete by:  As directed        Medication List    STOP taking these medications   aspirin 325 MG tablet     TAKE these medications   ADVAIR DISKUS 500-50 MCG/DOSE Aepb Generic drug:  Fluticasone-Salmeterol Place 1 puff into alternate nostrils 2 (two) times daily.   amiodarone  100 MG tablet Commonly known as:  PACERONE Take 1 tablet (100 mg total) by mouth 2 (two) times daily.   dexamethasone 4 MG tablet Commonly known as:  DECADRON Take 2.5 tablets (10 mg total) by mouth 4 (four) times daily.   diphenhydrAMINE 25 mg capsule Commonly known as:  BENADRYL Take 25 mg by mouth every 6 (six) hours as needed for sleep.   levETIRAcetam 500 MG tablet Commonly known as:  KEPPRA Take 1 tablet (500 mg total) by mouth 2 (two) times daily.   losartan-hydrochlorothiazide 50-12.5 MG tablet Commonly known as:  HYZAAR Take 1 tablet by mouth daily.   pantoprazole 40 MG tablet Commonly known as:  PROTONIX Take 1 tablet (40 mg total) by mouth daily.   PROAIR HFA 108 (90 Base) MCG/ACT inhaler Generic drug:  albuterol Place 1-2 puffs into alternate nostrils every 4 (four) hours as needed for  wheezing.      Follow-up Information    Magrinat, Virgie Dad, MD. Schedule an appointment as soon as possible for a visit in 5 day(s).   Specialty:  Oncology Contact information: [place] [address] [phone]        Kyung Rudd, MD Follow up.   Specialty:  Radiation Oncology Contact information: [address] [place] [address] [phone]        Kary Kos, MD. Schedule an appointment as soon as possible for a visit in 1 week(s).   Specialty:  Neurosurgery Contact information: [address] [place] [address] [phone]        Constance Haw, MD. Schedule an appointment as soon as possible for a visit in 2 week(s).   Specialty:  Cardiology Contact information: [place] [address] [phone]          No Known Allergies  Consultations:  Cardiology Interventional radiology Radiation oncology Electrophysiology Hematology Neurosurgery   Procedures/Studies: Dg Chest 2 View  Result Date: 09/18/2016 CLINICAL DATA:  Generalized body aches, fever, chills,  nausea and vomiting for 3 days. EXAM: CHEST  2 VIEW COMPARISON:  None. FINDINGS: There is cardiomegaly and vascular congestion. No consolidative process or pneumothorax. Trace bilateral pleural effusions noted. No acute bony abnormality. IMPRESSION: Cardiomegaly and pulmonary vascular congestion. Trace bilateral pleural effusions. Electronically Signed   By: Inge Rise M.D.   On: 09/18/2016 19:53   Ct Head Wo Contrast  Result Date: 09/18/2016 CLINICAL DATA:  Generalized body aches, fever, chills, nausea and vomiting for 3 days. RIGHT eye and ear pain. EXAM: CT HEAD WITHOUT CONTRAST TECHNIQUE: Contiguous axial images were obtained from the base of the skull through the vertex without intravenous contrast. COMPARISON:  None. FINDINGS: BRAIN: 4.3 x 7.6 cm rounded hypodensity with heterogeneous component RIGHT prop occipital lobe effacing the RIGHT atrium. Surrounding apparent vasogenic edema. Local mass-effect, 3 mm RIGHT to LEFT midline shift. No intraparenchymal hemorrhage. No hydrocephalus. No abnormal extra-axial fluid collections. VASCULAR: Moderate calcific atherosclerosis of the carotid siphons. SKULL: No skull fracture. No significant scalp soft tissue swelling. SINUSES/ORBITS: Trace paranasal sinus mucosal thickening. Mastoid air cells are well aerated. The included ocular globes and orbital contents are non-suspicious. Status post bilateral ocular lens implants. OTHER: None. IMPRESSION: 1. Large RIGHT parieto-occipital masslike hypodensity, differential diagnosis includes tumor/metastasis, cerebritis or less likely subacute MCA/ posterior watershed territory infarct. 3 mm RIGHT to LEFT midline shift. Recommend MRI of the brain with and without contrast. Acute findings discussed with and reconfirmed by [facility] NANAVATI on 09/18/2016 at 10:20 pm. Electronically Signed   By: Elon Alas M.D.   On: 09/18/2016 22:23   Ct Chest W Contrast  Result Date: 09/19/2016 CLINICAL DATA:  Remote history  of breast cancer with recent brain MRI demonstrating metastatic brain lesions. Restaging CT scans. EXAM: CT CHEST, ABDOMEN, AND PELVIS WITH CONTRAST TECHNIQUE: Multidetector CT imaging of the chest, abdomen and pelvis was performed following the standard protocol during bolus administration of intravenous contrast. CONTRAST:  146mL ISOVUE-300 IOPAMIDOL (ISOVUE-300) INJECTION 61% COMPARISON:  None. FINDINGS: CT CHEST FINDINGS Cardiovascular: The heart is normal in size. No pericardial effusion. Mild tortuosity and calcification of the thoracic aorta but no aneurysm or dissection. The branch vessels are patent. Scattered coronary artery calcifications are noted. Mediastinum/Nodes: Necrotic appearing mediastinal lymphadenopathy. 3.3 x 2.1 cm nodal mass in the aorticopulmonary window on image number 25. There is also cystic/necrotic subcarinal lymphadenopathy with maximum Adama Ferber  axis diameter of 25 mm on image number 33. Other scattered smaller lymph nodes are noted including a 9 mm right infrahilar lymph node on image number 38. The esophagus is grossly normal. Lungs/Pleura: Diffuse pulmonary metastatic disease with numerous bilateral pulmonary nodules. Index lesion in the right middle lobe on image number 115 measures 12 x 11 mm. Left upper lobe nodule on image number 85 measures 7 x 5 mm. Right lower lobe lesion just above the right hemidiaphragm on image number 124 measures 14.5 x 12.5 mm. Large necrotic lesion in the as ago esophageal recess could be a necrotic metastatic lung lesion or a necrotic lymph node. It measures 4.2 by 3.0 cm. Small bilateral pleural effusions and bibasilar atelectasis. Chest wall/ Musculoskeletal: Surgical clips surrounding a cystic area in the right breast, likely small liquified hematoma related to a prior lumpectomy. No obvious breast masses. No axillary lymphadenopathy. No supraclavicular lymphadenopathy. The thyroid gland appears normal. No definite metastatic bone disease. There is a  small sclerotic lesion noted in the T1 vertebral body and also in the T3 vertebral body. I do not however see any other definite lesions to suggest metastatic disease. CT ABDOMEN PELVIS FINDINGS Hepatobiliary: No definite findings for hepatic metastatic disease. Layering high attenuation material in the gallbladder could be sludge or stones. No findings for acute cholecystitis. Pancreas: No mass, inflammation or ductal dilatation. Spleen: Normal size.  No focal lesions. Adrenals/Urinary Tract: The adrenal glands are unremarkable. Somewhat irregular appearing left upper pole cystic lesion measures less than 20 Hounsfield units and is most likely a cyst. No worrisome renal lesions or hydronephrosis. Stomach/Bowel: The stomach, duodenum, small bowel and colon are grossly normal. No acute inflammatory process, mass lesions or obstructive findings. Colonic diverticulosis without findings for acute diverticulitis. Vascular/Lymphatic: Moderate atherosclerotic calcifications involving a tortuous abdominal aorta but no focal aneurysm or dissection. The branch vessels are patent. The major venous structures are patent. 11.5 mm gastrohepatic ligament lymph node is noted on image number 60. There is also a low-attenuation retrocrural lymph node measures 11 mm on image number 59. No mesenteric or retroperitoneal mass or lymphadenopathy. Reproductive: The enlarged fibroid uterus. The ovaries appear normal. Other: No pelvic lymphadenopathy. There are enlarged right inguinal lymph nodes. Suspect postoperative changes in the right inguinal area. Musculoskeletal: No obvious metastatic bone disease. IMPRESSION: 1. Pulmonary metastatic disease and fairly extensive necrotic appearing mediastinal and hilar lymphadenopathy. 2. No obvious breast masses and no supraclavicular or axillary adenopathy. 3. No obvious abdominal/pelvic metastatic disease or osseous metastatic disease. A few small upper thoracic sclerotic bone lesions require  observation. 4. Enlarged fibroid uterus. 5. Enlarged right inguinal lymph nodes and possible prior postoperative changes. Electronically Signed   By: Marijo Sanes M.D.   On: 09/19/2016 14:45   Mr Jeri Cos ZH Contrast  Result Date: 09/19/2016 CLINICAL DATA:  Abnormal head CT. Headaches. History breast cancer. EXAM: MRI HEAD WITHOUT AND WITH CONTRAST TECHNIQUE: Multiplanar, multiecho pulse sequences of the brain and surrounding structures were obtained without and with intravenous contrast. CONTRAST:  91mL MULTIHANCE GADOBENATE DIMEGLUMINE 529 MG/ML IV SOLN COMPARISON:  Head CT from yesterday FINDINGS: Brain: Large heterogeneously enhancing mass in the posterior right cerebrum, spanning the parietal and occipital lobes and measuring up to 6 x 5 x 6.4 cm. This mass contains scattered cystic spaces and probable blood products. The overlying cortex is not visible and there is a thin line of overlying dural thickening. There is local mass effect, mild for the size of tumor. Mild vasogenic edema.  10 mm left cerebellar and 3 mm left parietal cortex enhancing masses, overall pattern suggesting metastatic disease. Punctate areas of cortical diffusion hyperintensity in the left occipital pole consistent with small infarcts. Mild restricted diffusion seen in the right cerebellum, suggesting subacute ischemia. Diffusion within the right-sided mass from blood products or other paramagnetic substance. There is scattered FLAIR hyperintensities in the cerebral white matter consistent with chronic small vessel ischemia. Vascular: Major flow voids are preserved. Skull and upper cervical spine: No aggressive marrow lesion. Right parietal bone lesion has intrinsic T1 hyperintensity most consistent with hemangioma. Sinuses/Orbits: Bilateral cataract resection. Mild chronic sinusitis. IMPRESSION: 1. Metastatic pattern with 3 brain masses. The largest is in the posterior right cerebrum measuring up to 6.4 cm; local mass effect. The  other lesions measure 1 cm in the left cerebellum and 3 mm in the left parietal cortex. 2. 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 Subacute ischemia in the upper right cerebellum. Electronically Signed   By: Monte Fantasia M.D.   On: 09/19/2016 07:49   Ct Abdomen Pelvis W Contrast  Result Date: 09/19/2016 CLINICAL DATA:  Remote history of breast cancer with recent brain MRI demonstrating metastatic brain lesions. Restaging CT scans. EXAM: CT CHEST, ABDOMEN, AND PELVIS WITH CONTRAST TECHNIQUE: Multidetector CT imaging of the chest, abdomen and pelvis was performed following the standard protocol during bolus administration of intravenous contrast. CONTRAST:  157mL ISOVUE-300 IOPAMIDOL (ISOVUE-300) INJECTION 61% COMPARISON:  None. FINDINGS: CT CHEST FINDINGS Cardiovascular: The heart is normal in size. No pericardial effusion. Mild tortuosity and calcification of the thoracic aorta but no aneurysm or dissection. The branch vessels are patent. Scattered coronary artery calcifications are noted. Mediastinum/Nodes: Necrotic appearing mediastinal lymphadenopathy. 3.3 x 2.1 cm nodal mass in the aorticopulmonary window on image number 25. There is also cystic/necrotic subcarinal lymphadenopathy with maximum Fitzroy Mikami axis diameter of 25 mm on image number 33. Other scattered smaller lymph nodes are noted including a 9 mm right infrahilar lymph node on image number 38. The esophagus is grossly normal. Lungs/Pleura: Diffuse pulmonary metastatic disease with numerous bilateral pulmonary nodules. Index lesion in the right middle lobe on image number 115 measures 12 x 11 mm. Left upper lobe nodule on image number 85 measures 7 x 5 mm. Right lower lobe lesion just above the right hemidiaphragm on image number 124 measures 14.5 x 12.5 mm. Large necrotic lesion in the as ago esophageal recess could be a necrotic metastatic lung lesion or a necrotic lymph node. It measures 4.2 by 3.0 cm. Small bilateral pleural  effusions and bibasilar atelectasis. Chest wall/ Musculoskeletal: Surgical clips surrounding a cystic area in the right breast, likely small liquified hematoma related to a prior lumpectomy. No obvious breast masses. No axillary lymphadenopathy. No supraclavicular lymphadenopathy. The thyroid gland appears normal. No definite metastatic bone disease. There is a small sclerotic lesion noted in the T1 vertebral body and also in the T3 vertebral body. I do not however see any other definite lesions to suggest metastatic disease. CT ABDOMEN PELVIS FINDINGS Hepatobiliary: No definite findings for hepatic metastatic disease. Layering high attenuation material in the gallbladder could be sludge or stones. No findings for acute cholecystitis. Pancreas: No mass, inflammation or ductal dilatation. Spleen: Normal size.  No focal lesions. Adrenals/Urinary Tract: The adrenal glands are unremarkable. Somewhat irregular appearing left upper pole cystic lesion measures less than 20 Hounsfield units and is most likely a cyst. No worrisome renal lesions or hydronephrosis. Stomach/Bowel: The stomach, duodenum, small bowel and colon  are grossly normal. No acute inflammatory process, mass lesions or obstructive findings. Colonic diverticulosis without findings for acute diverticulitis. Vascular/Lymphatic: Moderate atherosclerotic calcifications involving a tortuous abdominal aorta but no focal aneurysm or dissection. The branch vessels are patent. The major venous structures are patent. 11.5 mm gastrohepatic ligament lymph node is noted on image number 60. There is also a low-attenuation retrocrural lymph node measures 11 mm on image number 59. No mesenteric or retroperitoneal mass or lymphadenopathy. Reproductive: The enlarged fibroid uterus. The ovaries appear normal. Other: No pelvic lymphadenopathy. There are enlarged right inguinal lymph nodes. Suspect postoperative changes in the right inguinal area. Musculoskeletal: No obvious  metastatic bone disease. IMPRESSION: 1. Pulmonary metastatic disease and fairly extensive necrotic appearing mediastinal and hilar lymphadenopathy. 2. No obvious breast masses and no supraclavicular or axillary adenopathy. 3. No obvious abdominal/pelvic metastatic disease or osseous metastatic disease. A few small upper thoracic sclerotic bone lesions require observation. 4. Enlarged fibroid uterus. 5. Enlarged right inguinal lymph nodes and possible prior postoperative changes. Electronically Signed   By: Marijo Sanes M.D.   On: 09/19/2016 14:45   US Biopsy  Result Date: 09/20/2016 INDICATION: METASTATIC DISEASE, BRAIN METASTASES, INGUINAL ADENOPATHY, UNKNOWN PRIMARY EXAM: ULTRASOUND RIGHT INGUINAL ADENOPATHY 18 GAUGE CORE BIOPSY MEDICATIONS: 1% lidocaine local ANESTHESIA/SEDATION: Moderate (conscious) sedation was employed during this procedure. A total of Versed 2.0 mg and Fentanyl 100 mcg was administered intravenously. Moderate Sedation Time: 10 Minutes. The patient's level of consciousness and vital signs were monitored continuously by radiology nursing throughout the procedure under my direct supervision. FLUOROSCOPY TIME:  Fluoroscopy Time: None. COMPLICATIONS: None immediate. PROCEDURE: Informed written consent was obtained from the patient after a thorough discussion of the procedural risks, benefits and alternatives. All questions were addressed. Maximal Sterile Barrier Technique was utilized including caps, mask, sterile gowns, sterile gloves, sterile drape, hand hygiene and skin antiseptic. A timeout was performed prior to the initiation of the procedure. Previous imaging reviewed. Preliminary ultrasound performed. Right inguinal abnormal adenopathy localized. Overlying skin marked. Under sterile conditions and local anesthesia, an 18 gauge core biopsy medial was advanced to the right inguinal abnormal adenopathy. 5 18 gauge core biopsies obtained. Samples placed in saline. Needle removed.  Postprocedure imaging demonstrates no hemorrhage or hematoma. Patient tolerated the biopsy well. No Complication. IMPRESSION: Successful ultrasound right inguinal adenopathy 18 gauge core biopsies Electronically Signed   By: Jerilynn Mages.  Shick M.D.   On: 09/20/2016 12:50    Subjective:  Denies new numbness, tingling, weakness. States that she realizes now that she had blurred vision and it's better.  She thought her blurred vision was secondary to her cataracts. She has been eating well. She ambulated with physical therapy. No new complaints.  Discharge Exam: Vitals:   09/23/16 0845 09/23/16 1419  BP:  122/68  Pulse:  62  Resp:  18  Temp:  97.8 F (36.6 C)  SpO2: 94% 97%   Vitals:   09/22/16 2307 09/23/16 0430 09/23/16 0845 09/23/16 1419  BP: 125/75 (!) 144/76  122/68  Pulse: (!) 52 (!) 55  62  Resp: 17 16  18   Temp: 98 F (36.7 C) 97.6 F (36.4 C)  97.8 F (36.6 C)  TempSrc: Oral Oral  Oral  SpO2: 94% 96% 94% 97%  Weight:      Height:        General: Pt is alert, awake, not in acute distress Cardiovascular: Bradycardic, regular rhythm, S1/S2 +, no rubs, no gallops Respiratory: CTA bilaterally, no wheezing, no rhonchi Abdominal: Soft, NT,  ND, bowel sounds + Extremities: no edema, no cyanosis Neuro: Cranial nerves II through XII grossly intact, strength 5 out of 5 throughout, sensation intact to light touch throughout, minimal dysmetria of the left hand and left leg    The results of significant diagnostics from this hospitalization (including imaging, microbiology, ancillary and laboratory) are listed below for reference.     Microbiology: Recent Results (from the past 240 hour(s))  Rapid strep screen     Status: None   Collection Time: 09/18/16  5:20 PM  Result Value Ref Range Status   Streptococcus, Group A Screen (Direct) NEGATIVE NEGATIVE Final    Comment: (NOTE) A Rapid Antigen test may result negative if the antigen level in the sample is below the detection level of  this test. The FDA has not cleared this test as a stand-alone test therefore the rapid antigen negative result has reflexed to a Group A Strep culture.   Culture, group A strep     Status: None   Collection Time: 09/18/16  5:20 PM  Result Value Ref Range Status   Specimen Description THROAT  Final   Special Requests NONE Reflexed from [id number]  Final   Culture   Final    NO GROUP A STREP (S.PYOGENES) ISOLATED Performed at [facility], [address] [place], [place] [address]    Report Status 09/21/2016 FINAL  Final  MRSA PCR Screening     Status: None   Collection Time: 09/19/16  2:55 AM  Result Value Ref Range Status   MRSA by PCR NEGATIVE NEGATIVE Final    Comment:        The GeneXpert MRSA Assay (FDA approved for NASAL specimens only), is one component of a comprehensive MRSA colonization surveillance program. It is not intended to diagnose MRSA infection nor to guide or monitor treatment for MRSA infections.      Labs: BNP (last 3 results) No results for input(s): BNP in the last 8760 hours. Basic Metabolic Panel:  Recent Labs Lab 09/19/16 0319 09/20/16 0253 09/21/16 0309 09/22/16 0455 09/23/16 0418  NA 138 142 138 140 138  K 3.8 4.3 4.0 4.2 3.9  CL 105 108 105 104 105  CO2 27 28 26 30 26   GLUCOSE 126* 139* 146* 151* 134*  BUN 12 13 18 17  23*  CREATININE 0.78 0.67 0.64 0.69 0.72  CALCIUM 8.9 9.3 8.9 9.1 8.7*  MG  --  1.9 1.9 1.9 2.1   Liver Function Tests:  Recent Labs Lab 09/19/16 0319  AST 21  ALT 18  ALKPHOS 61  BILITOT 0.6  PROT 6.3*  ALBUMIN 3.7    Recent Labs Lab 09/19/16 0319  LIPASE 29   No results for input(s): AMMONIA in the last 168 hours. CBC:  Recent Labs Lab 09/19/16 0319 09/20/16 0253 09/21/16 0309 09/22/16 0455 09/23/16 0418  WBC 11.1* 9.5 11.3* 12.1* 12.1*  HGB 13.7 13.6 13.5 14.4 14.2  HCT 40.6 40.4 39.6 42.9 41.7  MCV 96.0 95.1 94.7 95.3 93.3  PLT 261 297 301 306 311   Cardiac Enzymes:  Recent  Labs Lab 09/19/16 1710 09/19/16 2302 09/20/16 0253  TROPONINI <0.03 <0.03 <0.03   BNP: Invalid input(s): POCBNP CBG:  Recent Labs Lab 09/19/16 0817 09/20/16 0858 09/21/16 0748 09/22/16 0755 09/23/16 0759  GLUCAP 116* 124* 172* 145* 123*   D-Dimer No results for input(s): DDIMER in the last 72 hours. Hgb A1c No results for input(s): HGBA1C in the last 72 hours. Lipid Profile No results  for input(s): CHOL, HDL, LDLCALC, TRIG, CHOLHDL, LDLDIRECT in the last 72 hours. Thyroid function studies No results for input(s): TSH, T4TOTAL, T3FREE, THYROIDAB in the last 72 hours.  Invalid input(s): FREET3 Anemia work up No results for input(s): VITAMINB12, FOLATE, FERRITIN, TIBC, IRON, RETICCTPCT in the last 72 hours. Urinalysis    Component Value Date/Time   COLORURINE AMBER (A) 09/18/2016 1659   APPEARANCEUR HAZY (A) 09/18/2016 1659   LABSPEC 1.027 09/18/2016 1659   PHURINE 5.0 09/18/2016 1659   GLUCOSEU NEGATIVE 09/18/2016 1659   HGBUR SMALL (A) 09/18/2016 1659   BILIRUBINUR NEGATIVE 09/18/2016 1659   KETONESUR 80 (A) 09/18/2016 1659   PROTEINUR 30 (A) 09/18/2016 1659   NITRITE NEGATIVE 09/18/2016 1659   LEUKOCYTESUR MODERATE (A) 09/18/2016 1659   Sepsis Labs Invalid input(s): PROCALCITONIN,  WBC,  LACTICIDVEN   Time coordinating discharge: Over 30 minutes  SIGNED:   Janece Canterbury, MD  [facility] 09/23/2016, 5:55 PM Pager   If 7PM-7AM, please contact night-coverage [email or web address] Password [email or web address]

## 2016-09-23 NOTE — Care Management Important Message (Signed)
Important Message  Patient Details  Name: Averey Koning MRN: [id number] Date of Birth: 08-30-1940   Medicare Important Message Given:  Yes    Kerin Salen 09/23/2016, 11:32 AMImportant Message  Patient Details  Name: Anice Wilshire MRN: [id number] Date of Birth: 05-26-1940   Medicare Important Message Given:  Yes    Kerin Salen 09/23/2016, 11:32 AM

## 2016-09-23 NOTE — Progress Notes (Signed)
I spoke with the patient's daughter. Her biopsy results are pending and we hope to hear more in the next day or so. CNS Oncology conference will meet instead on Wednesday this week and we will be discussing her case. We will review recommendations for radiotherapy +/- surgical resection once we know her primary. Her daughter states agreement and understanding. She will remain in the hospital per the primary team, but I did let her daughter know that if she's medically stable to discharge coordination of her care may be as an outpatient.    Carola Rhine, PAC

## 2016-09-24 ENCOUNTER — Telehealth: Payer: Self-pay | Admitting: Cardiovascular Disease

## 2016-09-24 LAB — GLUCOSE, CAPILLARY: GLUCOSE-CAPILLARY: 134 mg/dL — AB (ref 65–99)

## 2016-09-24 MED ORDER — GADOBENATE DIMEGLUMINE 529 MG/ML IV SOLN
15.0000 mL | Freq: Once | INTRAVENOUS | Status: AC | PRN
  Administered 2016-09-20: 13 mL via INTRAVENOUS

## 2016-09-24 MED ORDER — DEXAMETHASONE 4 MG PO TABS: 8.0000 mg | ORAL_TABLET | Freq: Two times a day (BID) | ORAL | 0 refills | Status: AC

## 2016-09-24 NOTE — Telephone Encounter (Signed)
New message      Pt c/o medication issue:  1. Name of Medication:  amiodarone  2. How are you currently taking this medication (dosage and times per day)?  100 mg  3. Are you having a reaction (difficulty breathing--STAT)?  no 4. What is your medication issue?  Pt was discharged from the hosp today.  She needs an urgent prior authorization.  Pt had to pay for 3 days of medication.  Please call daughter when it has been approved

## 2016-09-24 NOTE — Progress Notes (Signed)
Progress Note  Patient Name: Deanna Washington Date of Encounter: 09/24/2016  Primary Cardiologist: Dr. Oval Linsey new  Subjective   Going home today no complaints   Inpatient Medications    Scheduled Meds: . amiodarone  100 mg Oral BID  . dexamethasone  10 mg Intravenous Q6H  . enoxaparin (LOVENOX) injection  40 mg Subcutaneous Q24H  . losartan  25 mg Oral Daily   And  . hydrochlorothiazide  12.5 mg Oral Daily  . levETIRAcetam  500 mg Oral BID  . mometasone-formoterol  2 puff Inhalation BID  . pantoprazole  40 mg Oral Daily   Continuous Infusions:  PRN Meds: acetaminophen, albuterol, diphenhydrAMINE, morphine injection, ondansetron (ZOFRAN) IV   Vital Signs    Vitals:   09/23/16 2000 09/23/16 2209 09/24/16 0532 09/24/16 0537  BP:  117/64 123/69   Pulse:  (!) 48 (!) 58 67  Resp:  18 18   Temp:  97.8 F (36.6 C) 98.5 F (36.9 C)   TempSrc:  Oral Oral   SpO2: 96% 95% 96%   Weight:      Height:       No intake or output data in the 24 hours ending 09/24/16 0930 Filed Weights   09/18/16 1546 09/21/16 0515  Weight: 148 lb (67.1 kg) 169 lb 1.5 oz (76.7 kg)    Telemetry    SB with PVCs and one episode of brief ST HR in 40-50s  - Personally Reviewed  ECG    No new - Personally Reviewed  Physical Exam   GEN: No acute distress.   Neck: No JVD Cardiac: RRR, no murmurs, rubs, or gallops.  Respiratory: Clear to auscultation bilaterally. GI: Soft, nontender, non-distended  MS: + chronic Rt lower ext edema none on Lt; No deformity. Neuro:  Nonfocal  Psych: Normal affect   Labs    Chemistry Recent Labs Lab 09/19/16 0319  09/21/16 0309 09/22/16 0455 09/23/16 0418  NA 138  < > 138 140 138  K 3.8  < > 4.0 4.2 3.9  CL 105  < > 105 104 105  CO2 27  < > 26 30 26   GLUCOSE 126*  < > 146* 151* 134*  BUN 12  < > 18 17 23*  CREATININE 0.78  < > 0.64 0.69 0.72  CALCIUM 8.9  < > 8.9 9.1 8.7*  PROT 6.3*  --   --   --   --   ALBUMIN 3.7  --   --   --   --   AST  21  --   --   --   --   ALT 18  --   --   --   --   ALKPHOS 61  --   --   --   --   BILITOT 0.6  --   --   --   --   GFRNONAA >60  < > >60 >60 >60  GFRAA >60  < > >60 >60 >60  ANIONGAP 6  < > 7 6 7   < > = values in this interval not displayed.   Hematology  Recent Labs Lab 09/21/16 0309 09/22/16 0455 09/23/16 0418  WBC 11.3* 12.1* 12.1*  RBC 4.18 4.50 4.47  HGB 13.5 14.4 14.2  HCT 39.6 42.9 41.7  MCV 94.7 95.3 93.3  MCH 32.3 32.0 31.8  MCHC 34.1 33.6 34.1  RDW 12.3 12.4 12.5  PLT 301 306 311    Cardiac Enzymes  Recent Labs Lab 09/19/16 1710 09/19/16  2302 09/20/16 0253  TROPONINI <0.03 <0.03 <0.03     Recent Labs Lab 09/18/16 1727  TROPIPOC 0.00     BNPNo results for input(s): BNP, PROBNP in the last 168 hours.   DDimer No results for input(s): DDIMER in the last 168 hours.   Radiology    No results found.  Cardiac Studies   Echo 09/20/16 Study Conclusions  - Left ventricle: The cavity size was normal. There was mild   concentric hypertrophy. Systolic function was normal. The   estimated ejection fraction was in the range of 60% to 65%. Wall   motion was normal; there were no regional wall motion   abnormalities. Doppler parameters are consistent with abnormal   left ventricular relaxation (grade 1 diastolic dysfunction).   There was no evidence of elevated ventricular filling pressure by   Doppler parameters. - Aortic valve: There was mild regurgitation. - Aortic root: The aortic root was normal in size. - Mitral valve: There was trivial regurgitation. - Left atrium: The atrium was normal in size. - Right atrium: The atrium was mildly dilated. - Tricuspid valve: There was moderate regurgitation. - Pulmonic valve: There was no regurgitation. - Pulmonary arteries: Systolic pressure was moderately increased.   PA peak pressure: 45 mm Hg (S). - Inferior vena cava: The vessel was dilated. The respirophasic   diameter changes were blunted (< 50%),  consistent with elevated   central venous pressure. - Pericardium, extracardiac: A trivial pericardial effusion was   identified posterior to the heart. Features were not consistent   with tamponade physiology.  Impressions:  - No cardiac source of emboli was indentified.  Patient Profile     76 y.o. 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    Assessment & Plan    PAF:  SR rate 50's this am beta blocker d/c amiodarone dose decreased to 100 mg bid No anticoagulation for now given metastatic cancer and still need for lung or brain biopsy Since lymph node biopsy non diagnostic   Will arrange outpatient f/u with Dr Ronald Lobo

## 2016-09-24 NOTE — Telephone Encounter (Signed)
Spoke with the pharmacy and insurance will cover Amiodarone 200 mg 1/2 tablet twice a day, #30 with 1 refill called in  Left message to call back

## 2016-09-24 NOTE — Progress Notes (Signed)
Pt selected [facility] for HHPT. Referral given to in house rep.

## 2016-09-24 NOTE — Progress Notes (Signed)
Deanna Washington   DOB:1940/04/30   WJ#:[id number]   AOZ#:[id number]  Subjective:  Deanna Washington had a mild h/a this AM, controlled with NSAIDS. She ambulated in halls with mild balance difficulties. She tells me she has a walker (her huband's) at home. No family in room  Objective: middle aged White woman  Vitals:   09/24/16 0532 09/24/16 0537  BP: 123/69   Pulse: (!) 58 67  Resp: 18   Temp: 98.5 F (36.9 C)   SpO2: 96%     Body mass index is 25.71 kg/m. No intake or output data in the 24 hours ending 09/24/16 0748  CBG (last 3)   Recent Labs  09/22/16 0755 09/23/16 0759  GLUCAP 145* 123*     Labs:  Lab Results  Component Value Date   WBC 12.1 (H) 09/23/2016   HGB 14.2 09/23/2016   HCT 41.7 09/23/2016   MCV 93.3 09/23/2016   PLT 311 09/23/2016    @LASTCHEMISTRY @  Urine Studies No results for input(s): UHGB, CRYS in the last 72 hours.  Invalid input(s): UACOL, UAPR, USPG, UPH, UTP, UGL, UKET, UBIL, UNIT, UROB, [place], UEPI, UWBC, Junie Panning [place], [place], [place]  Basic Metabolic Panel:  Recent Labs Lab 09/19/16 0319 09/20/16 0253 09/21/16 0309 09/22/16 0455 09/23/16 0418  NA 138 142 138 140 138  K 3.8 4.3 4.0 4.2 3.9  CL 105 108 105 104 105  CO2 27 28 26 30 26   GLUCOSE 126* 139* 146* 151* 134*  BUN 12 13 18 17  23*  CREATININE 0.78 0.67 0.64 0.69 0.72  CALCIUM 8.9 9.3 8.9 9.1 8.7*  MG  --  1.9 1.9 1.9 2.1   GFR Estimated Creatinine Clearance: 67.2 mL/min (by C-G formula based on SCr of 0.72 mg/dL). Liver Function Tests:  Recent Labs Lab 09/19/16 0319  AST 21  ALT 18  ALKPHOS 61  BILITOT 0.6  PROT 6.3*  ALBUMIN 3.7    Recent Labs Lab 09/19/16 0319  LIPASE 29   No results for input(s): AMMONIA in the last 168 hours. Coagulation profile  Recent Labs Lab 09/20/16 0253  INR 1.03    CBC:  Recent Labs Lab 09/19/16 0319 09/20/16 0253 09/21/16 0309 09/22/16 0455 09/23/16 0418  WBC 11.1* 9.5 11.3* 12.1* 12.1*  HGB 13.7 13.6 13.5 14.4 14.2   HCT 40.6 40.4 39.6 42.9 41.7  MCV 96.0 95.1 94.7 95.3 93.3  PLT 261 297 301 306 311   Cardiac Enzymes:  Recent Labs Lab 09/19/16 1710 09/19/16 2302 09/20/16 0253  TROPONINI <0.03 <0.03 <0.03   BNP: Invalid input(s): POCBNP CBG:  Recent Labs Lab 09/19/16 0817 09/20/16 0858 09/21/16 0748 09/22/16 0755 09/23/16 0759  GLUCAP 116* 124* 172* 145* 123*   D-Dimer No results for input(s): DDIMER in the last 72 hours. Hgb A1c No results for input(s): HGBA1C in the last 72 hours. Lipid Profile No results for input(s): CHOL, HDL, LDLCALC, TRIG, CHOLHDL, LDLDIRECT in the last 72 hours. Thyroid function studies No results for input(s): TSH, T4TOTAL, T3FREE, THYROIDAB in the last 72 hours.  Invalid input(s): FREET3 Anemia work up No results for input(s): VITAMINB12, FOLATE, FERRITIN, TIBC, IRON, RETICCTPCT in the last 72 hours. Microbiology Recent Results (from the past 240 hour(s))  Rapid strep screen     Status: None   Collection Time: 09/18/16  5:20 PM  Result Value Ref Range Status   Streptococcus, Group A Screen (Direct) NEGATIVE NEGATIVE Final    Comment: (NOTE) A Rapid Antigen test may result negative if the antigen level  in the sample is below the detection level of this test. The FDA has not cleared this test as a stand-alone test therefore the rapid antigen negative result has reflexed to a Group A Strep culture.   Culture, group A strep     Status: None   Collection Time: 09/18/16  5:20 PM  Result Value Ref Range Status   Specimen Description THROAT  Final   Special Requests NONE Reflexed from [id number]  Final   Culture   Final    NO GROUP A STREP (S.PYOGENES) ISOLATED Performed at [facility], [address], [place], [place] [address]    Report Status 09/21/2016 FINAL  Final  MRSA PCR Screening     Status: None   Collection Time: 09/19/16  2:55 AM  Result Value Ref Range Status   MRSA by PCR NEGATIVE NEGATIVE Final    Comment:        The GeneXpert  MRSA Assay (FDA approved for NASAL specimens only), is one component of a comprehensive MRSA colonization surveillance program. It is not intended to diagnose MRSA infection nor to guide or monitor treatment for MRSA infections.       Studies:  Dg Chest 2 View  Result Date: 09/18/2016 CLINICAL DATA:  Generalized body aches, fever, chills, nausea and vomiting for 3 days. EXAM: CHEST  2 VIEW COMPARISON:  None. FINDINGS: There is cardiomegaly and vascular congestion. No consolidative process or pneumothorax. Trace bilateral pleural effusions noted. No acute bony abnormality. IMPRESSION: Cardiomegaly and pulmonary vascular congestion. Trace bilateral pleural effusions. Electronically Signed   By: Inge Rise M.D.   On: 09/18/2016 19:53   Ct Head Wo Contrast  Result Date: 09/18/2016 CLINICAL DATA:  Generalized body aches, fever, chills, nausea and vomiting for 3 days. RIGHT eye and ear pain. EXAM: CT HEAD WITHOUT CONTRAST TECHNIQUE: Contiguous axial images were obtained from the base of the skull through the vertex without intravenous contrast. COMPARISON:  None. FINDINGS: BRAIN: 4.3 x 7.6 cm rounded hypodensity with heterogeneous component RIGHT prop occipital lobe effacing the RIGHT atrium. Surrounding apparent vasogenic edema. Local mass-effect, 3 mm RIGHT to LEFT midline shift. No intraparenchymal hemorrhage. No hydrocephalus. No abnormal extra-axial fluid collections. VASCULAR: Moderate calcific atherosclerosis of the carotid siphons. SKULL: No skull fracture. No significant scalp soft tissue swelling. SINUSES/ORBITS: Trace paranasal sinus mucosal thickening. Mastoid air cells are well aerated. The included ocular globes and orbital contents are non-suspicious. Status post bilateral ocular lens implants. OTHER: None. IMPRESSION: 1. Large RIGHT parieto-occipital masslike hypodensity, differential diagnosis includes tumor/metastasis, cerebritis or less likely subacute MCA/ posterior watershed  territory infarct. 3 mm RIGHT to LEFT midline shift. Recommend MRI of the brain with and without contrast. Acute findings discussed with and reconfirmed by [facility] NANAVATI on 09/18/2016 at 10:20 pm. Electronically Signed   By: Elon Alas M.D.   On: 09/18/2016 22:23   Ct Chest W Contrast  Result Date: 09/19/2016 CLINICAL DATA:  Remote history of breast cancer with recent brain MRI demonstrating metastatic brain lesions. Restaging CT scans. EXAM: CT CHEST, ABDOMEN, AND PELVIS WITH CONTRAST TECHNIQUE: Multidetector CT imaging of the chest, abdomen and pelvis was performed following the standard protocol during bolus administration of intravenous contrast. CONTRAST:  178mL ISOVUE-300 IOPAMIDOL (ISOVUE-300) INJECTION 61% COMPARISON:  None. FINDINGS: CT CHEST FINDINGS Cardiovascular: The heart is normal in size. No pericardial effusion. Mild tortuosity and calcification of the thoracic aorta but no aneurysm or dissection. The branch vessels are patent. Scattered coronary artery calcifications are noted. Mediastinum/Nodes: Necrotic  appearing mediastinal lymphadenopathy. 3.3 x 2.1 cm nodal mass in the aorticopulmonary window on image number 25. There is also cystic/necrotic subcarinal lymphadenopathy with maximum short axis diameter of 25 mm on image number 33. Other scattered smaller lymph nodes are noted including a 9 mm right infrahilar lymph node on image number 38. The esophagus is grossly normal. Lungs/Pleura: Diffuse pulmonary metastatic disease with numerous bilateral pulmonary nodules. Index lesion in the right middle lobe on image number 115 measures 12 x 11 mm. Left upper lobe nodule on image number 85 measures 7 x 5 mm. Right lower lobe lesion just above the right hemidiaphragm on image number 124 measures 14.5 x 12.5 mm. Large necrotic lesion in the as ago esophageal recess could be a necrotic metastatic lung lesion or a necrotic lymph node. It measures 4.2 by 3.0 cm. Small bilateral pleural  effusions and bibasilar atelectasis. Chest wall/ Musculoskeletal: Surgical clips surrounding a cystic area in the right breast, likely small liquified hematoma related to a prior lumpectomy. No obvious breast masses. No axillary lymphadenopathy. No supraclavicular lymphadenopathy. The thyroid gland appears normal. No definite metastatic bone disease. There is a small sclerotic lesion noted in the T1 vertebral body and also in the T3 vertebral body. I do not however see any other definite lesions to suggest metastatic disease. CT ABDOMEN PELVIS FINDINGS Hepatobiliary: No definite findings for hepatic metastatic disease. Layering high attenuation material in the gallbladder could be sludge or stones. No findings for acute cholecystitis. Pancreas: No mass, inflammation or ductal dilatation. Spleen: Normal size.  No focal lesions. Adrenals/Urinary Tract: The adrenal glands are unremarkable. Somewhat irregular appearing left upper pole cystic lesion measures less than 20 Hounsfield units and is most likely a cyst. No worrisome renal lesions or hydronephrosis. Stomach/Bowel: The stomach, duodenum, small bowel and colon are grossly normal. No acute inflammatory process, mass lesions or obstructive findings. Colonic diverticulosis without findings for acute diverticulitis. Vascular/Lymphatic: Moderate atherosclerotic calcifications involving a tortuous abdominal aorta but no focal aneurysm or dissection. The branch vessels are patent. The major venous structures are patent. 11.5 mm gastrohepatic ligament lymph node is noted on image number 60. There is also a low-attenuation retrocrural lymph node measures 11 mm on image number 59. No mesenteric or retroperitoneal mass or lymphadenopathy. Reproductive: The enlarged fibroid uterus. The ovaries appear normal. Other: No pelvic lymphadenopathy. There are enlarged right inguinal lymph nodes. Suspect postoperative changes in the right inguinal area. Musculoskeletal: No obvious  metastatic bone disease. IMPRESSION: 1. Pulmonary metastatic disease and fairly extensive necrotic appearing mediastinal and hilar lymphadenopathy. 2. No obvious breast masses and no supraclavicular or axillary adenopathy. 3. No obvious abdominal/pelvic metastatic disease or osseous metastatic disease. A few small upper thoracic sclerotic bone lesions require observation. 4. Enlarged fibroid uterus. 5. Enlarged right inguinal lymph nodes and possible prior postoperative changes. Electronically Signed   By: Marijo Sanes M.D.   On: 09/19/2016 14:45   Mr Jeri Cos IO Contrast  Result Date: 09/19/2016 CLINICAL DATA:  Abnormal head CT. Headaches. History breast cancer. EXAM: MRI HEAD WITHOUT AND WITH CONTRAST TECHNIQUE: Multiplanar, multiecho pulse sequences of the brain and surrounding structures were obtained without and with intravenous contrast. CONTRAST:  54mL MULTIHANCE GADOBENATE DIMEGLUMINE 529 MG/ML IV SOLN COMPARISON:  Head CT from yesterday FINDINGS: Brain: Large heterogeneously enhancing mass in the posterior right cerebrum, spanning the parietal and occipital lobes and measuring up to 6 x 5 x 6.4 cm. This mass contains scattered cystic spaces and probable blood products. The overlying cortex is  not visible and there is a thin line of overlying dural thickening. There is local mass effect, mild for the size of tumor. Mild vasogenic edema. 10 mm left cerebellar and 3 mm left parietal cortex enhancing masses, overall pattern suggesting metastatic disease. Punctate areas of cortical diffusion hyperintensity in the left occipital pole consistent with small infarcts. Mild restricted diffusion seen in the right cerebellum, suggesting subacute ischemia. Diffusion within the right-sided mass from blood products or other paramagnetic substance. There is scattered FLAIR hyperintensities in the cerebral white matter consistent with chronic small vessel ischemia. Vascular: Major flow voids are preserved. Skull and  upper cervical spine: No aggressive marrow lesion. Right parietal bone lesion has intrinsic T1 hyperintensity most consistent with hemangioma. Sinuses/Orbits: Bilateral cataract resection. Mild chronic sinusitis. IMPRESSION: 1. Metastatic pattern with 3 brain masses. The largest is in the posterior right cerebrum measuring up to 6.4 cm; local mass effect. The other lesions measure 1 cm in the left cerebellum and 3 mm in the left parietal cortex. 2. 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 Subacute ischemia in the upper right cerebellum. Electronically Signed   By: Monte Fantasia M.D.   On: 09/19/2016 07:49   Ct Abdomen Pelvis W Contrast  Result Date: 09/19/2016 CLINICAL DATA:  Remote history of breast cancer with recent brain MRI demonstrating metastatic brain lesions. Restaging CT scans. EXAM: CT CHEST, ABDOMEN, AND PELVIS WITH CONTRAST TECHNIQUE: Multidetector CT imaging of the chest, abdomen and pelvis was performed following the standard protocol during bolus administration of intravenous contrast. CONTRAST:  165mL ISOVUE-300 IOPAMIDOL (ISOVUE-300) INJECTION 61% COMPARISON:  None. FINDINGS: CT CHEST FINDINGS Cardiovascular: The heart is normal in size. No pericardial effusion. Mild tortuosity and calcification of the thoracic aorta but no aneurysm or dissection. The branch vessels are patent. Scattered coronary artery calcifications are noted. Mediastinum/Nodes: Necrotic appearing mediastinal lymphadenopathy. 3.3 x 2.1 cm nodal mass in the aorticopulmonary window on image number 25. There is also cystic/necrotic subcarinal lymphadenopathy with maximum short axis diameter of 25 mm on image number 33. Other scattered smaller lymph nodes are noted including a 9 mm right infrahilar lymph node on image number 38. The esophagus is grossly normal. Lungs/Pleura: Diffuse pulmonary metastatic disease with numerous bilateral pulmonary nodules. Index lesion in the right middle lobe on image number  115 measures 12 x 11 mm. Left upper lobe nodule on image number 85 measures 7 x 5 mm. Right lower lobe lesion just above the right hemidiaphragm on image number 124 measures 14.5 x 12.5 mm. Large necrotic lesion in the as ago esophageal recess could be a necrotic metastatic lung lesion or a necrotic lymph node. It measures 4.2 by 3.0 cm. Small bilateral pleural effusions and bibasilar atelectasis. Chest wall/ Musculoskeletal: Surgical clips surrounding a cystic area in the right breast, likely small liquified hematoma related to a prior lumpectomy. No obvious breast masses. No axillary lymphadenopathy. No supraclavicular lymphadenopathy. The thyroid gland appears normal. No definite metastatic bone disease. There is a small sclerotic lesion noted in the T1 vertebral body and also in the T3 vertebral body. I do not however see any other definite lesions to suggest metastatic disease. CT ABDOMEN PELVIS FINDINGS Hepatobiliary: No definite findings for hepatic metastatic disease. Layering high attenuation material in the gallbladder could be sludge or stones. No findings for acute cholecystitis. Pancreas: No mass, inflammation or ductal dilatation. Spleen: Normal size.  No focal lesions. Adrenals/Urinary Tract: The adrenal glands are unremarkable. Somewhat irregular appearing left upper pole cystic lesion  measures less than 20 Hounsfield units and is most likely a cyst. No worrisome renal lesions or hydronephrosis. Stomach/Bowel: The stomach, duodenum, small bowel and colon are grossly normal. No acute inflammatory process, mass lesions or obstructive findings. Colonic diverticulosis without findings for acute diverticulitis. Vascular/Lymphatic: Moderate atherosclerotic calcifications involving a tortuous abdominal aorta but no focal aneurysm or dissection. The branch vessels are patent. The major venous structures are patent. 11.5 mm gastrohepatic ligament lymph node is noted on image number 60. There is also a  low-attenuation retrocrural lymph node measures 11 mm on image number 59. No mesenteric or retroperitoneal mass or lymphadenopathy. Reproductive: The enlarged fibroid uterus. The ovaries appear normal. Other: No pelvic lymphadenopathy. There are enlarged right inguinal lymph nodes. Suspect postoperative changes in the right inguinal area. Musculoskeletal: No obvious metastatic bone disease. IMPRESSION: 1. Pulmonary metastatic disease and fairly extensive necrotic appearing mediastinal and hilar lymphadenopathy. 2. No obvious breast masses and no supraclavicular or axillary adenopathy. 3. No obvious abdominal/pelvic metastatic disease or osseous metastatic disease. A few small upper thoracic sclerotic bone lesions require observation. 4. Enlarged fibroid uterus. 5. Enlarged right inguinal lymph nodes and possible prior postoperative changes. Electronically Signed   By: Marijo Sanes M.D.   On: 09/19/2016 14:45   US Biopsy  Result Date: 09/20/2016 INDICATION: METASTATIC DISEASE, BRAIN METASTASES, INGUINAL ADENOPATHY, UNKNOWN PRIMARY EXAM: ULTRASOUND RIGHT INGUINAL ADENOPATHY 18 GAUGE CORE BIOPSY MEDICATIONS: 1% lidocaine local ANESTHESIA/SEDATION: Moderate (conscious) sedation was employed during this procedure. A total of Versed 2.0 mg and Fentanyl 100 mcg was administered intravenously. Moderate Sedation Time: 10 Minutes. The patient's level of consciousness and vital signs were monitored continuously by radiology nursing throughout the procedure under my direct supervision. FLUOROSCOPY TIME:  Fluoroscopy Time: None. COMPLICATIONS: None immediate. PROCEDURE: Informed written consent was obtained from the patient after a thorough discussion of the procedural risks, benefits and alternatives. All questions were addressed. Maximal Sterile Barrier Technique was utilized including caps, mask, sterile gowns, sterile gloves, sterile drape, hand hygiene and skin antiseptic. A timeout was performed prior to the  initiation of the procedure. Previous imaging reviewed. Preliminary ultrasound performed. Right inguinal abnormal adenopathy localized. Overlying skin marked. Under sterile conditions and local anesthesia, an 18 gauge core biopsy medial was advanced to the right inguinal abnormal adenopathy. 5 18 gauge core biopsies obtained. Samples placed in saline. Needle removed. Postprocedure imaging demonstrates no hemorrhage or hematoma. Patient tolerated the biopsy well. No Complication. IMPRESSION: Successful ultrasound right inguinal adenopathy 18 gauge core biopsies Electronically Signed   By: Jerilynn Mages.  Shick M.D.   On: 09/20/2016 12:50     Assessment: 76 y.o. [facility] woman presenting with headaches, dizziness, and vomiting 09/18/2016, initial noncontrasted CT showing a large right parieto-occipital hypodensity, status post inguinal node biopsy 09/20/2016 with results pending  (1) status post right lumpectomy 2010 in [place], followed by radiation; no chemotherapy or anti-estrogens  METASTATIC DISEASE: August 2018 (2) brain MRI 09/19/2016 shows 3 brain masses the largest measuring 6.4 cm, involving right cerebrum, cerebellum, and left parietal cortex; CT of the chest abdomen and pelvis 09/19/2016 shows multiple bilateral pulmonary nodules, small bilateral effusions, mediastinal, subcarinal and aortopulmonary window adenopathy, no liver or bone involvement             (a) right inguinal lymph node core biopsy 09/20/2016, results pending  (b) CA 27-29 is informative (this means we are dealing with breast cancer)  Plan:  Reviewed lymph node workup so far. Special studies pending but as far as I  could gather all they are seeing is  Lymphoid tissue. This is not unexpected as inguinal nodes are the least specific.  If she is to undergo craniotomy we will get the pathology from that. Otherwise I will set her up for biopsy of one of the lung nodules.  I will call her [facility] with plan from the  [facility] tumor group and plan to see her in about 2 weeks to start systemic treatment (once radiation to the brain completed)  Will sign off at this point    Chauncey Cruel, MD 09/24/2016  7:48 AM Medical Oncology and Hematology [facility] [place], [place] [address] Tel. [phone]    Fax. [phone]

## 2016-09-24 NOTE — Care Management Note (Signed)
Case Management Note  Patient Details  Name: Deanna Washington MRN: [id number] Date of Birth: 05/09/1940  Subjective/Objective:                    Action/Plan:   Expected Discharge Date:  09/24/16               Expected Discharge Plan:  [place]  In-House Referral:     Discharge planning Services  CM Consult  Post Acute Care Choice:    Choice offered to:     DME Arranged:    DME Agency:     HH Arranged:  PT [place]:  [place]  Status of Service:  Completed, signed off  If discussed at [place] of Stay Meetings, dates discussed:    Additional CommentsPurcell Mouton, RN 09/24/2016, 11:48 AM

## 2016-09-24 NOTE — Discharge Summary (Signed)
Physician Discharge Summary  Deanna Washington OEU:[id number] DOB: [birth year] DOA: 09/18/2016  PCP: No primary care provider on file.  Admit date: 09/18/2016 Discharge date: 09/24/2016  Admitted From: home  Disposition:  home  Recommendations for Outpatient Follow-up:  1.  Patient will be notified by the Oncology office regarding test results and further plans.  F/u with Dr. Jana Hakim on Thursday or Friday 2.  Continued dexamethasone which will need to be tapered  3.  EP to follow up regarding tachy-brady syndrome and amiodarone monitoring 4.  Eventually will need to start anticoagulation, defer to cardiology/oncology  Home Health:  PT  Equipment/Devices:  Cane  Discharge Condition:  Stable, improved CODE STATUS:  Full code  Diet recommendation:  regular   Brief/Interim Summary:   The patient is a 76 year old female with history of breast cancer status post lumpectomy and radiation 8 years ago who has been in remission and asthma who presented with progressive headache, nausea and vomiting. CT of the head demonstrated a large right parieto-occipital mass like hypodensity insistent with a tumor or metastases with 3 mm of right to left midline shift.  She was admitted and started on Decadron. MRI confirmed a 6 x 5 x 6.4 cm heterogeneous enhancing mass in the posterior right cerebrum spinning but the parietal and occipital lobes with scattered cystic spaces and blood products. There is local mass effect and mild vasogenic edema. There are also 2 additional masses a 10 mm left cerebellar mass and a 3 mm left parietal cortex mass suggestive of metastatic disease. In addition, she had punctate areas of cortical diffusion hyperintensity in the left occipital lobe consistent with small acute infarcts with a subacute ischemia in the upper right cerebellum.  Dr. Saintclair Halsted from neurosurgery reviewed the CT and MRI and felt that this might represent a high-grade glioma or glioblastoma. He increased the patient's  Decadron to 10 mg IV every 6 hours and started Keppra for seizure prophylaxis.  CT of the chest/abdomen and pelvis demonstrates numerous lung nodules and mediastinal/hilar lymphadenopathy suggestive of possible prior lung CA vs. Recurrence of her breast cancer.  Her CA 27.29 is elevated suggestive of recurrent breast cancer.  She had a biopsy of her right inguinal lymph node and pathology is pending.  Her case will be discussed at tumor board on Wednseday.  In the meantime, she was incidentally found to have sinus bradycardia with intermittent a-fib with RVR in the 120s.  Cardiology was consulted as well as EP for tachy-brady syndrome.  She was started on amiodarone and was tolerating 100mg  po BID at the time of discharge.    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  Nausea and vomiting have improved -  CEA wnl -  CA 27.29 elevated suggestive of primary breast cancer -  Decadron decreased to 8mg  po BID -  Keppra 500 mg twice a day started -  Right inguinal lymph node biopsy on 8/24, pathology nondiagnostic -  Oncology consultation:  Dr. Jana Hakim -  XRT Onc:  Dr. Lisbeth Renshaw  -  NSGY:  Dr. Saintclair Halsted -  Case will  be discussed at tumor board on Wednesday.  Either craniotomy with biopsy or pulmonology referral for bronch with biopsy  Acute and subacute embolic cerebral infarcts, likely due to a-fib (new diagnosis as of 8/23) -  ECHO with bubble, preserved EF, grade 1 DD, moderate TR -  Carotid duplex:  Findings suggest 1-39% internal carotid artery stenosis bilaterally. -  A1c 5.4 -  LDL  87, started statin -  PT/OT/SLP:  Home health PT, cane, 24 hour supervision -  Ongoing speech/congition therapy  -  No aspirin or anticoagulation prior to possible brain surgery  Tachy-brady syndrome with pauses up to 3 seconds.  CHADs2vasc = 5.  Asymptomatic sinus bradycardia down to 30s on telemetry overnight, but HR increases with exertion/movement.  No a-fib in last several days (since starting amio) -  TSH 0.43 and troponins negative -  Cardiology and EP consults appreciated -  Amiodarone reduced to 100mg  po BID -  Consider lovenox/warfarin post-surgery   RLE edema, chronic -  Duplex RLE:  No DVT.  Incidentally found a vascularized hypoechoic homogenous area of the right groin measuring 2.8cm  HTN, blood pressures mildly elevated  - Continued hydrochlorothiazide and losartan  Asthma, stable, continued [facility]  Discharge Instructions  Discharge Instructions    Call MD for:  difficulty breathing, headache or visual disturbances    Complete by:  As directed    Call MD for:  extreme fatigue    Complete by:  As directed    Call MD for:  hives    Complete by:  As directed    Call MD for:  persistant dizziness or light-headedness    Complete by:  As directed    Call MD for:  persistant nausea and vomiting    Complete by:  As directed    Call MD for:  severe uncontrolled pain    Complete by:  As directed    Call MD for:  temperature >100.4    Complete by:  As directed    Diet general    Complete by:  As directed    Increase activity slowly    Complete by:  As directed        Medication List    STOP taking these medications   aspirin 325 MG tablet     TAKE these medications   ADVAIR DISKUS 500-50 MCG/DOSE Aepb Generic drug:  Fluticasone-Salmeterol Place 1 puff into alternate nostrils 2 (two) times daily.   atorvastatin 20 MG tablet Commonly known as:  LIPITOR Take 1 tablet (20 mg total) by mouth daily.   dexamethasone 4 MG tablet Commonly known as:   DECADRON Take 2 tablets (8 mg total) by mouth 2 (two) times daily with a meal.   diphenhydrAMINE 25 mg capsule Commonly known as:  BENADRYL Take 25 mg by mouth every 6 (six) hours as needed for sleep.   levETIRAcetam 500 MG tablet Commonly known as:  KEPPRA Take 1 tablet (500 mg total) by mouth 2 (two) times daily.   losartan-hydrochlorothiazide 50-12.5 MG tablet Commonly known as:  HYZAAR Take 1 tablet by mouth daily.   pantoprazole 40 MG tablet Commonly known as:  PROTONIX Take 1 tablet (40 mg total) by mouth daily.   PROAIR HFA 108 (90 Base) MCG/ACT inhaler Generic drug:  albuterol Place 1-2 puffs into alternate nostrils every 4 (four) hours as needed for wheezing.      Follow-up Information    Magrinat, Virgie Dad, MD. Schedule an appointment as soon as possible for a visit in  5 day(s).   Specialty:  Oncology Contact information: [place] [address] [phone]        Kyung Rudd, MD Follow up.   Specialty:  Radiation Oncology Contact information: [address] [place] [address] [phone]        Kary Kos, MD. Schedule an appointment as soon as possible for a visit in 1 week(s).   Specialty:  Neurosurgery Contact information: [address] [place] [address] [place] [address] [phone]        Constance Haw, MD. Schedule an appointment as soon as possible for a visit in 2 week(s).   Specialty:  Cardiology Contact information: [address] [place] [address] [phone]        Skeet Latch, MD. Schedule an appointment as soon as possible for a visit on 10/14/2016.   Specialty:  Cardiology Why:  at [place] information: [address] [place] [address] [phone]          No Known Allergies  Consultations:  Cardiology Interventional radiology Radiation oncology Electrophysiology Hematology Neurosurgery   Procedures/Studies: Dg Chest 2  View  Result Date: 09/18/2016 CLINICAL DATA:  Generalized body aches, fever, chills, nausea and vomiting for 3 days. EXAM: CHEST  2 VIEW COMPARISON:  None. FINDINGS: There is cardiomegaly and vascular congestion. No consolidative process or pneumothorax. Trace bilateral pleural effusions noted. No acute bony abnormality. IMPRESSION: Cardiomegaly and pulmonary vascular congestion. Trace bilateral pleural effusions. Electronically Signed   By: Inge Rise M.D.   On: 09/18/2016 19:53   Ct Head Wo Contrast  Result Date: 09/18/2016 CLINICAL DATA:  Generalized body aches, fever, chills, nausea and vomiting for 3 days. RIGHT eye and ear pain. EXAM: CT HEAD WITHOUT CONTRAST TECHNIQUE: Contiguous axial images were obtained from the base of the skull through the vertex without intravenous contrast. COMPARISON:  None. FINDINGS: BRAIN: 4.3 x 7.6 cm rounded hypodensity with heterogeneous component RIGHT prop occipital lobe effacing the RIGHT atrium. Surrounding apparent vasogenic edema. Local mass-effect, 3 mm RIGHT to LEFT midline shift. No intraparenchymal hemorrhage. No hydrocephalus. No abnormal extra-axial fluid collections. VASCULAR: Moderate calcific atherosclerosis of the carotid siphons. SKULL: No skull fracture. No significant scalp soft tissue swelling. SINUSES/ORBITS: Trace paranasal sinus mucosal thickening. Mastoid air cells are well aerated. The included ocular globes and orbital contents are non-suspicious. Status post bilateral ocular lens implants. OTHER: None. IMPRESSION: 1. Large RIGHT parieto-occipital masslike hypodensity, differential diagnosis includes tumor/metastasis, cerebritis or less likely subacute MCA/ posterior watershed territory infarct. 3 mm RIGHT to LEFT midline shift. Recommend MRI of the brain with and without contrast. Acute findings discussed with and reconfirmed by [facility] NANAVATI on 09/18/2016 at 10:20 pm. Electronically Signed   By: Elon Alas M.D.   On: 09/18/2016  22:23   Ct Chest W Contrast  Result Date: 09/19/2016 CLINICAL DATA:  Remote history of breast cancer with recent brain MRI demonstrating metastatic brain lesions. Restaging CT scans. EXAM: CT CHEST, ABDOMEN, AND PELVIS WITH CONTRAST TECHNIQUE: Multidetector CT imaging of the chest, abdomen and pelvis was performed following the standard protocol during bolus administration of intravenous contrast. CONTRAST:  171mL ISOVUE-300 IOPAMIDOL (ISOVUE-300) INJECTION 61% COMPARISON:  None. FINDINGS: CT CHEST FINDINGS Cardiovascular: The heart is normal in size. No pericardial effusion. Mild tortuosity and calcification of the thoracic aorta but no aneurysm or dissection. The branch vessels are patent. Scattered coronary artery calcifications are noted. Mediastinum/Nodes: Necrotic appearing mediastinal lymphadenopathy. 3.3 x 2.1 cm nodal  mass in the aorticopulmonary window on image number 25. There is also cystic/necrotic subcarinal lymphadenopathy with maximum Kelleen Stolze axis diameter of 25 mm on image number 33. Other scattered smaller lymph nodes are noted including a 9 mm right infrahilar lymph node on image number 38. The esophagus is grossly normal. Lungs/Pleura: Diffuse pulmonary metastatic disease with numerous bilateral pulmonary nodules. Index lesion in the right middle lobe on image number 115 measures 12 x 11 mm. Left upper lobe nodule on image number 85 measures 7 x 5 mm. Right lower lobe lesion just above the right hemidiaphragm on image number 124 measures 14.5 x 12.5 mm. Large necrotic lesion in the as ago esophageal recess could be a necrotic metastatic lung lesion or a necrotic lymph node. It measures 4.2 by 3.0 cm. Small bilateral pleural effusions and bibasilar atelectasis. Chest wall/ Musculoskeletal: Surgical clips surrounding a cystic area in the right breast, likely small liquified hematoma related to a prior lumpectomy. No obvious breast masses. No axillary lymphadenopathy. No supraclavicular  lymphadenopathy. The thyroid gland appears normal. No definite metastatic bone disease. There is a small sclerotic lesion noted in the T1 vertebral body and also in the T3 vertebral body. I do not however see any other definite lesions to suggest metastatic disease. CT ABDOMEN PELVIS FINDINGS Hepatobiliary: No definite findings for hepatic metastatic disease. Layering high attenuation material in the gallbladder could be sludge or stones. No findings for acute cholecystitis. Pancreas: No mass, inflammation or ductal dilatation. Spleen: Normal size.  No focal lesions. Adrenals/Urinary Tract: The adrenal glands are unremarkable. Somewhat irregular appearing left upper pole cystic lesion measures less than 20 Hounsfield units and is most likely a cyst. No worrisome renal lesions or hydronephrosis. Stomach/Bowel: The stomach, duodenum, small bowel and colon are grossly normal. No acute inflammatory process, mass lesions or obstructive findings. Colonic diverticulosis without findings for acute diverticulitis. Vascular/Lymphatic: Moderate atherosclerotic calcifications involving a tortuous abdominal aorta but no focal aneurysm or dissection. The branch vessels are patent. The major venous structures are patent. 11.5 mm gastrohepatic ligament lymph node is noted on image number 60. There is also a low-attenuation retrocrural lymph node measures 11 mm on image number 59. No mesenteric or retroperitoneal mass or lymphadenopathy. Reproductive: The enlarged fibroid uterus. The ovaries appear normal. Other: No pelvic lymphadenopathy. There are enlarged right inguinal lymph nodes. Suspect postoperative changes in the right inguinal area. Musculoskeletal: No obvious metastatic bone disease. IMPRESSION: 1. Pulmonary metastatic disease and fairly extensive necrotic appearing mediastinal and hilar lymphadenopathy. 2. No obvious breast masses and no supraclavicular or axillary adenopathy. 3. No obvious abdominal/pelvic metastatic  disease or osseous metastatic disease. A few small upper thoracic sclerotic bone lesions require observation. 4. Enlarged fibroid uterus. 5. Enlarged right inguinal lymph nodes and possible prior postoperative changes. Electronically Signed   By: Marijo Sanes M.D.   On: 09/19/2016 14:45   Mr Jeri Cos [place] Contrast  Result Date: 09/19/2016 CLINICAL DATA:  Abnormal head CT. Headaches. History breast cancer. EXAM: MRI HEAD WITHOUT AND WITH CONTRAST TECHNIQUE: Multiplanar, multiecho pulse sequences of the brain and surrounding structures were obtained without and with intravenous contrast. CONTRAST:  58mL MULTIHANCE GADOBENATE DIMEGLUMINE 529 MG/ML IV SOLN COMPARISON:  Head CT from yesterday FINDINGS: Brain: Large heterogeneously enhancing mass in the posterior right cerebrum, spanning the parietal and occipital lobes and measuring up to 6 x 5 x 6.4 cm. This mass contains scattered cystic spaces and probable blood products. The overlying cortex is not visible and there is a thin line  of overlying dural thickening. There is local mass effect, mild for the size of tumor. Mild vasogenic edema. 10 mm left cerebellar and 3 mm left parietal cortex enhancing masses, overall pattern suggesting metastatic disease. Punctate areas of cortical diffusion hyperintensity in the left occipital pole consistent with small infarcts. Mild restricted diffusion seen in the right cerebellum, suggesting subacute ischemia. Diffusion within the right-sided mass from blood products or other paramagnetic substance. There is scattered FLAIR hyperintensities in the cerebral white matter consistent with chronic small vessel ischemia. Vascular: Major flow voids are preserved. Skull and upper cervical spine: No aggressive marrow lesion. Right parietal bone lesion has intrinsic T1 hyperintensity most consistent with hemangioma. Sinuses/Orbits: Bilateral cataract resection. Mild chronic sinusitis. IMPRESSION: 1. Metastatic pattern with 3 brain masses.  The largest is in the posterior right cerebrum measuring up to 6.4 cm; local mass effect. The other lesions measure 1 cm in the left cerebellum and 3 mm in the left parietal cortex. 2. 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 Subacute ischemia in the upper right cerebellum. Electronically Signed   By: Monte Fantasia M.D.   On: 09/19/2016 07:49   Ct Abdomen Pelvis W Contrast  Result Date: 09/19/2016 CLINICAL DATA:  Remote history of breast cancer with recent brain MRI demonstrating metastatic brain lesions. Restaging CT scans. EXAM: CT CHEST, ABDOMEN, AND PELVIS WITH CONTRAST TECHNIQUE: Multidetector CT imaging of the chest, abdomen and pelvis was performed following the standard protocol during bolus administration of intravenous contrast. CONTRAST:  164mL ISOVUE-300 IOPAMIDOL (ISOVUE-300) INJECTION 61% COMPARISON:  None. FINDINGS: CT CHEST FINDINGS Cardiovascular: The heart is normal in size. No pericardial effusion. Mild tortuosity and calcification of the thoracic aorta but no aneurysm or dissection. The branch vessels are patent. Scattered coronary artery calcifications are noted. Mediastinum/Nodes: Necrotic appearing mediastinal lymphadenopathy. 3.3 x 2.1 cm nodal mass in the aorticopulmonary window on image number 25. There is also cystic/necrotic subcarinal lymphadenopathy with maximum Nazier Neyhart axis diameter of 25 mm on image number 33. Other scattered smaller lymph nodes are noted including a 9 mm right infrahilar lymph node on image number 38. The esophagus is grossly normal. Lungs/Pleura: Diffuse pulmonary metastatic disease with numerous bilateral pulmonary nodules. Index lesion in the right middle lobe on image number 115 measures 12 x 11 mm. Left upper lobe nodule on image number 85 measures 7 x 5 mm. Right lower lobe lesion just above the right hemidiaphragm on image number 124 measures 14.5 x 12.5 mm. Large necrotic lesion in the as ago esophageal recess could be a necrotic  metastatic lung lesion or a necrotic lymph node. It measures 4.2 by 3.0 cm. Small bilateral pleural effusions and bibasilar atelectasis. Chest wall/ Musculoskeletal: Surgical clips surrounding a cystic area in the right breast, likely small liquified hematoma related to a prior lumpectomy. No obvious breast masses. No axillary lymphadenopathy. No supraclavicular lymphadenopathy. The thyroid gland appears normal. No definite metastatic bone disease. There is a small sclerotic lesion noted in the T1 vertebral body and also in the T3 vertebral body. I do not however see any other definite lesions to suggest metastatic disease. CT ABDOMEN PELVIS FINDINGS Hepatobiliary: No definite findings for hepatic metastatic disease. Layering high attenuation material in the gallbladder could be sludge or stones. No findings for acute cholecystitis. Pancreas: No mass, inflammation or ductal dilatation. Spleen: Normal size.  No focal lesions. Adrenals/Urinary Tract: The adrenal glands are unremarkable. Somewhat irregular appearing left upper pole cystic lesion measures less than 20 Hounsfield units and is  most likely a cyst. No worrisome renal lesions or hydronephrosis. Stomach/Bowel: The stomach, duodenum, small bowel and colon are grossly normal. No acute inflammatory process, mass lesions or obstructive findings. Colonic diverticulosis without findings for acute diverticulitis. Vascular/Lymphatic: Moderate atherosclerotic calcifications involving a tortuous abdominal aorta but no focal aneurysm or dissection. The branch vessels are patent. The major venous structures are patent. 11.5 mm gastrohepatic ligament lymph node is noted on image number 60. There is also a low-attenuation retrocrural lymph node measures 11 mm on image number 59. No mesenteric or retroperitoneal mass or lymphadenopathy. Reproductive: The enlarged fibroid uterus. The ovaries appear normal. Other: No pelvic lymphadenopathy. There are enlarged right inguinal  lymph nodes. Suspect postoperative changes in the right inguinal area. Musculoskeletal: No obvious metastatic bone disease. IMPRESSION: 1. Pulmonary metastatic disease and fairly extensive necrotic appearing mediastinal and hilar lymphadenopathy. 2. No obvious breast masses and no supraclavicular or axillary adenopathy. 3. No obvious abdominal/pelvic metastatic disease or osseous metastatic disease. A few small upper thoracic sclerotic bone lesions require observation. 4. Enlarged fibroid uterus. 5. Enlarged right inguinal lymph nodes and possible prior postoperative changes. Electronically Signed   By: Marijo Sanes M.D.   On: 09/19/2016 14:45   US Biopsy  Result Date: 09/20/2016 INDICATION: METASTATIC DISEASE, BRAIN METASTASES, INGUINAL ADENOPATHY, UNKNOWN PRIMARY EXAM: ULTRASOUND RIGHT INGUINAL ADENOPATHY 18 GAUGE CORE BIOPSY MEDICATIONS: 1% lidocaine local ANESTHESIA/SEDATION: Moderate (conscious) sedation was employed during this procedure. A total of Versed 2.0 mg and Fentanyl 100 mcg was administered intravenously. Moderate Sedation Time: 10 Minutes. The patient's level of consciousness and vital signs were monitored continuously by radiology nursing throughout the procedure under my direct supervision. FLUOROSCOPY TIME:  Fluoroscopy Time: None. COMPLICATIONS: None immediate. PROCEDURE: Informed written consent was obtained from the patient after a thorough discussion of the procedural risks, benefits and alternatives. All questions were addressed. Maximal Sterile Barrier Technique was utilized including caps, mask, sterile gowns, sterile gloves, sterile drape, hand hygiene and skin antiseptic. A timeout was performed prior to the initiation of the procedure. Previous imaging reviewed. Preliminary ultrasound performed. Right inguinal abnormal adenopathy localized. Overlying skin marked. Under sterile conditions and local anesthesia, an 18 gauge core biopsy medial was advanced to the right inguinal  abnormal adenopathy. 5 18 gauge core biopsies obtained. Samples placed in saline. Needle removed. Postprocedure imaging demonstrates no hemorrhage or hematoma. Patient tolerated the biopsy well. No Complication. IMPRESSION: Successful ultrasound right inguinal adenopathy 18 gauge core biopsies Electronically Signed   By: Jerilynn Mages.  Shick M.D.   On: 09/20/2016 12:50    Subjective:  Ready to go home.  Blurry vision and left hand tremor are better.  Not sleeping well.  Anxious for news tomorrow about plan.    Discharge Exam: Vitals:   09/24/16 0537 09/24/16 0954  BP:    Pulse: 67   Resp:    Temp:    SpO2:  96%   Vitals:   09/23/16 2209 09/24/16 0532 09/24/16 0537 09/24/16 0954  BP: 117/64 123/69    Pulse: (!) 48 (!) 58 67   Resp: 18 18    Temp: 97.8 F (36.6 C) 98.5 F (36.9 C)    TempSrc: Oral Oral    SpO2: 95% 96%  96%  Weight:      Height:        General exam:  Adult female.  No acute distress.  HEENT:  NCAT, MMM Respiratory system: Clear to auscultation bilaterally Cardiovascular system: Regular rate and rhythm, normal S1/S2. No murmurs, rubs, gallops or clicks.  Warm extremities Gastrointestinal system: Normal active bowel sounds, soft, nondistended, nontender. MSK:  Normal tone and bulk, no lower extremity edema Neuro:  CN II-XII grossly intact, strength 5/5 throughout, sensation intact to light touch, minimal dysmetria of the left hand and leg and slightly ataxic gait.    The results of significant diagnostics from this hospitalization (including imaging, microbiology, ancillary and laboratory) are listed below for reference.     Microbiology: Recent Results (from the past 240 hour(s))  Rapid strep screen     Status: None   Collection Time: 09/18/16  5:20 PM  Result Value Ref Range Status   Streptococcus, Group A Screen (Direct) NEGATIVE NEGATIVE Final    Comment: (NOTE) A Rapid Antigen test may result negative if the antigen level in the sample is below the detection  level of this test. The FDA has not cleared this test as a stand-alone test therefore the rapid antigen negative result has reflexed to a Group A Strep culture.   Culture, group A strep     Status: None   Collection Time: 09/18/16  5:20 PM  Result Value Ref Range Status   Specimen Description THROAT  Final   Special Requests NONE Reflexed from [id number]  Final   Culture   Final    NO GROUP A STREP (S.PYOGENES) ISOLATED Performed at [facility], [address], [place], [place] [address]    Report Status 09/21/2016 FINAL  Final  MRSA PCR Screening     Status: None   Collection Time: 09/19/16  2:55 AM  Result Value Ref Range Status   MRSA by PCR NEGATIVE NEGATIVE Final    Comment:        The GeneXpert MRSA Assay (FDA approved for NASAL specimens only), is one component of a comprehensive MRSA colonization surveillance program. It is not intended to diagnose MRSA infection nor to guide or monitor treatment for MRSA infections.      Labs: BNP (last 3 results) No results for input(s): BNP in the last 8760 hours. Basic Metabolic Panel:  Recent Labs Lab 09/19/16 0319 09/20/16 0253 09/21/16 0309 09/22/16 0455 09/23/16 0418  NA 138 142 138 140 138  K 3.8 4.3 4.0 4.2 3.9  CL 105 108 105 104 105  CO2 27 28 26 30 26   GLUCOSE 126* 139* 146* 151* 134*  BUN 12 13 18 17  23*  CREATININE 0.78 0.67 0.64 0.69 0.72  CALCIUM 8.9 9.3 8.9 9.1 8.7*  MG  --  1.9 1.9 1.9 2.1   Liver Function Tests:  Recent Labs Lab 09/19/16 0319  AST 21  ALT 18  ALKPHOS 61  BILITOT 0.6  PROT 6.3*  ALBUMIN 3.7    Recent Labs Lab 09/19/16 0319  LIPASE 29   No results for input(s): AMMONIA in the last 168 hours. CBC:  Recent Labs Lab 09/19/16 0319 09/20/16 0253 09/21/16 0309 09/22/16 0455 09/23/16 0418  WBC 11.1* 9.5 11.3* 12.1* 12.1*  HGB 13.7 13.6 13.5 14.4 14.2  HCT 40.6 40.4 39.6 42.9 41.7  MCV 96.0 95.1 94.7 95.3 93.3  PLT 261 297 301 306 311   Cardiac  Enzymes:  Recent Labs Lab 09/19/16 1710 09/19/16 2302 09/20/16 0253  TROPONINI <0.03 <0.03 <0.03   BNP: Invalid input(s): POCBNP CBG:  Recent Labs Lab 09/20/16 0858 09/21/16 0748 09/22/16 0755 09/23/16 0759 09/24/16 0727  GLUCAP 124* 172* 145* 123* 134*   D-Dimer No results for input(s): DDIMER in the last 72 hours. Hgb A1c No results for input(s): HGBA1C  in the last 72 hours. Lipid Profile No results for input(s): CHOL, HDL, LDLCALC, TRIG, CHOLHDL, LDLDIRECT in the last 72 hours. Thyroid function studies No results for input(s): TSH, T4TOTAL, T3FREE, THYROIDAB in the last 72 hours.  Invalid input(s): FREET3 Anemia work up No results for input(s): VITAMINB12, FOLATE, FERRITIN, TIBC, IRON, RETICCTPCT in the last 72 hours. Urinalysis    Component Value Date/Time   COLORURINE AMBER (A) 09/18/2016 1659   APPEARANCEUR HAZY (A) 09/18/2016 1659   LABSPEC 1.027 09/18/2016 1659   PHURINE 5.0 09/18/2016 1659   GLUCOSEU NEGATIVE 09/18/2016 1659   HGBUR SMALL (A) 09/18/2016 1659   BILIRUBINUR NEGATIVE 09/18/2016 1659   KETONESUR 80 (A) 09/18/2016 1659   PROTEINUR 30 (A) 09/18/2016 1659   NITRITE NEGATIVE 09/18/2016 1659   LEUKOCYTESUR MODERATE (A) 09/18/2016 1659   Sepsis Labs Invalid input(s): PROCALCITONIN,  WBC,  LACTICIDVEN   Time coordinating discharge: Over 30 minutes  SIGNED:   Janece Canterbury, MD  [facility] 09/24/2016, 8:36 PM Pager   If 7PM-7AM, please contact night-coverage [email or web address] Password [email or web address]

## 2016-09-25 ENCOUNTER — Telehealth: Payer: Self-pay | Admitting: Radiation Oncology

## 2016-09-25 NOTE — Telephone Encounter (Signed)
The patient's daughter call me back, and we spent about 25 minutes discussing process currently with her mother, including the workup, and discussion with physicians multidisciplinary brain conference is alert discussing with medical oncology. After reviewing the risks, benefits and recovery, the patient's daughter feels that her mother would like to move forward and is interested in surgery understanding that this would not be a total resection, followed by postoperative SRS, systemic therapy based on markers from her final pathology. I've sent a message to all physicians involved, and Dr. Saintclair Halsted plans see her in the near future to make plans for surgical intervention.

## 2016-09-25 NOTE — Telephone Encounter (Signed)
I left a message with the patient to call me back to review recommendations for treatment.

## 2016-09-25 NOTE — Telephone Encounter (Signed)
Spoke with daughter and advised the Amiodarone 200 mg 1/2 tablet had been sent to pharmacy, she thanked me for taking care of Rx

## 2016-09-26 ENCOUNTER — Other Ambulatory Visit: Payer: Self-pay | Admitting: Neurosurgery

## 2016-09-26 ENCOUNTER — Other Ambulatory Visit (HOSPITAL_COMMUNITY): Payer: Self-pay | Admitting: Neurosurgery

## 2016-09-26 DIAGNOSIS — D496 Neoplasm of unspecified behavior of brain: Secondary | ICD-10-CM

## 2016-09-27 ENCOUNTER — Other Ambulatory Visit: Payer: Self-pay | Admitting: Oncology

## 2016-09-27 ENCOUNTER — Telehealth: Payer: Self-pay | Admitting: Oncology

## 2016-09-27 NOTE — Telephone Encounter (Signed)
Attempted to call patient regarding their appts on 9/19 however the call was messed up and did not go through.   Sending a reminder letter.

## 2016-09-28 ENCOUNTER — Ambulatory Visit (HOSPITAL_COMMUNITY)
Admission: RE | Admit: 2016-09-28 | Discharge: 2016-09-28 | Disposition: A | Discharge status: Home / Self Care | Payer: Medicare Other | Source: Ambulatory Visit | Attending: Neurosurgery | Admitting: Neurosurgery

## 2016-09-28 DIAGNOSIS — D496 Neoplasm of unspecified behavior of brain: Secondary | ICD-10-CM | POA: Insufficient documentation

## 2016-09-28 DIAGNOSIS — I639 Cerebral infarction, unspecified: Secondary | ICD-10-CM | POA: Diagnosis not present

## 2016-09-28 DIAGNOSIS — C7931 Secondary malignant neoplasm of brain: Secondary | ICD-10-CM | POA: Diagnosis not present

## 2016-09-28 MED ORDER — GADOBENATE DIMEGLUMINE 529 MG/ML IV SOLN
15.0000 mL | Freq: Once | INTRAVENOUS | Status: AC | PRN
  Administered 2016-09-28: 15 mL via INTRAVENOUS

## 2016-10-01 NOTE — Pre-Procedure Instructions (Signed)
[place]anna [place]  10/01/2016      [id number] [place], [place] [address] [address] Phone: [phone] Fax: [phone]    Your procedure is scheduled on .Sept 6   Report to [facility] at [phone] A.M.  Call this number if you have problems the morning of surgery:  [phone]   Remember:  Do not eat food or drink liquids after midnight.  Take these medicines the morning of surgery with A SIP OF WATER Advair Diskus, Amiodarone (Pacerone), dexamethasone (Decadron), Levetiracetam (Keppra), Pantoprazole (Protonix), Proair inhaler- bring your inhalers with you on the day of surgery   Do not wear jewelry, make-up or nail polish.  Do not wear lotions, powders, or perfumes, or deoderant.  Do not shave 48 hours prior to surgery.  Men may shave face and neck.  Do not bring valuables to the hospital.  [facility] is not responsible for any belongings or valuables.  Contacts, dentures or bridgework may not be worn into surgery.  Leave your suitcase in the car.  After surgery it may be brought to your room.  For patients admitted to the hospital, discharge time will be determined by your treatment team.  Patients discharged the day of surgery will not be allowed to drive home.    Special instructions:   - Preparing for Surgery  Before surgery, you can play an important role.  Because skin is not sterile, your skin needs to be as free of germs as possible.  You can reduce the number of germs on you skin by washing with CHG (chlorahexidine gluconate) soap before surgery.  CHG is an antiseptic cleaner which kills germs and bonds with the skin to continue killing germs even after washing.  Please DO NOT use if you have an allergy to CHG or antibacterial soaps.  If your skin becomes reddened/irritated stop using the CHG and inform your nurse when you arrive at Short Stay.  Do not shave (including legs and  underarms) for at least 48 hours prior to the first CHG shower.  You may shave your face.  Please follow these instructions carefully:   1.  Shower with CHG Soap the night before surgery and the morning of Surgery.  2.  If you choose to wash your hair, wash your hair first as usual with your   normal shampoo.  3.  After you shampoo, rinse your hair and body thoroughly to remove the Shampoo.  4.  Use CHG as you would any other liquid soap.  You can apply chg directly  to the skin and wash gently with scrungie or a clean washcloth.  5.  Apply the CHG Soap to your body ONLY FROM THE NECK DOWN.      Do not use on open wounds or open sores.  Avoid contact with your eyes,       ears, mouth and genitals (private parts).  Wash genitals (private parts)  with your normal soap.  6.  Wash thoroughly, paying special attention to the area where your surgery   will be performed.  7.  Thoroughly rinse your body with warm water from the neck down.  8.  DO NOT shower/wash with your normal soap after using and rinsing off  the CHG Soap.  9.  Pat yourself dry with a clean towel.            10.  Wear clean pajamas.  11.  Place clean sheets on your bed the night of your first shower and do not  sleep with pets.  Day of Surgery  Do not apply any lotions/deoderants the morning of surgery.  Please wear clean clothes to the hospital/surgery center.     Please read over the following fact sheets that you were given. Pain Booklet, Coughing and Deep Breathing, MRSA Information and Surgical Site Infection Prevention

## 2016-10-02 ENCOUNTER — Encounter (HOSPITAL_COMMUNITY): Payer: Self-pay

## 2016-10-02 ENCOUNTER — Encounter (HOSPITAL_COMMUNITY)
Admission: RE | Admit: 2016-10-02 | Discharge: 2016-10-02 | Disposition: A | Discharge status: Home / Self Care | Payer: Medicare Other | Source: Ambulatory Visit | Attending: Neurosurgery | Admitting: Neurosurgery

## 2016-10-02 LAB — BASIC METABOLIC PANEL
ANION GAP: 9 (ref 5–15)
BUN: 19 mg/dL (ref 6–20)
CALCIUM: 8.8 mg/dL — AB (ref 8.9–10.3)
CO2: 22 mmol/L (ref 22–32)
CREATININE: 0.91 mg/dL (ref 0.44–1.00)
Chloride: 105 mmol/L (ref 101–111)
Glucose, Bld: 152 mg/dL — ABNORMAL HIGH (ref 65–99)
Potassium: 3.8 mmol/L (ref 3.5–5.1)
SODIUM: 136 mmol/L (ref 135–145)

## 2016-10-02 LAB — CBC
HCT: 46.1 % — ABNORMAL HIGH (ref 36.0–46.0)
Hemoglobin: 15.5 g/dL — ABNORMAL HIGH (ref 12.0–15.0)
MCH: 31.6 pg (ref 26.0–34.0)
MCHC: 33.6 g/dL (ref 30.0–36.0)
MCV: 93.9 fL (ref 78.0–100.0)
PLATELETS: 308 10*3/uL (ref 150–400)
RBC: 4.91 MIL/uL (ref 3.87–5.11)
RDW: 13 % (ref 11.5–15.5)
WBC: 21.8 10*3/uL — AB (ref 4.0–10.5)

## 2016-10-02 LAB — TYPE AND SCREEN
ABO/RH(D): O POS
ANTIBODY SCREEN: NEGATIVE

## 2016-10-02 LAB — ABO/RH: ABO/RH(D): O POS

## 2016-10-02 NOTE — Progress Notes (Signed)
Anesthesia Chart Review:  Pt is a 76 year old [place] scheduled for R occipital craniotomy, tumor excision with curve for probable metastatic breast cancer on 10/03/2016 with Kary Kos, MD  - PCP is Domenick Gong, MD - Cardiologist is Skeet Latch, MD (new, first saw in hospital 09/19/16 for new onset afib) - EP cardiologist is Will Curt Bears, MD (new, first saw pt in hospital 09/20/16 for new dx tachy-brady syndrome; started on amiodarone)  PMH includes:  Atrial fibrillation, tachy-brady syndrome, CVA (cluster of small acute and subacute on MRI 09/19/16), HTN, asthma, breast cancer. Former smoker.  - Hospitalized 8/22-28/18 for new dx brain tumor, acute embolic stroke, new dx atrial fibrillation, new dx tachy-brady syndrome.   Medications include: advair, amiodarone, lipitor, dexamethasone, keppra, protonix, albuterol  Preoperative labs reviewed.  WBC 21.8. Pt taking dexamethasone for brain tumor   CXR 09/18/16:  - Cardiomegaly and pulmonary vascular congestion. - Trace bilateral pleural effusions.  EKG 09/19/16: Sinus bradycardia (50 bpm) with short PR\  Echo 09/19/16:  - Left ventricle: The cavity size was normal. There was mild concentric hypertrophy. Systolic function was normal. The estimated ejection fraction was in the range of 60% to 65%. Wall motion was normal; there were no regional wall motion abnormalities. Doppler parameters are consistent with abnormal left ventricular relaxation (grade 1 diastolic dysfunction). There was no evidence of elevated ventricular filling pressure by Doppler parameters. - Aortic valve: There was mild regurgitation. - Aortic root: The aortic root was normal in size. - Mitral valve: There was trivial regurgitation. - Left atrium: The atrium was normal in size. - Right atrium: The atrium was mildly dilated. - Tricuspid valve: There was moderate regurgitation. - Pulmonic valve: There was no regurgitation. - Pulmonary arteries: Systolic pressure was  moderately increased. PA peak pressure: 45 mm Hg (S). - Inferior vena cava: The vessel was dilated. The respirophasic diameter changes were blunted (< 50%), consistent with elevated central venous pressure. - Pericardium, extracardiac: A trivial pericardial effusion was identified posterior to the heart. Features were not consistent with tamponade physiology. - Impressions:  No cardiac source of emboli was indentified.  Carotid duplex 09/19/16:  - Findings suggest 1-39% internal carotid artery stenosis bilaterally.  - Vertebral arteries are patent with antegrade flow.  If no changes, I anticipate pt can proceed with surgery as scheduled.   [name], FNP-BC [facility]/Anesthesiology Phone: [phone] 10/02/2016 12:[place] PM

## 2016-10-02 NOTE — Progress Notes (Addendum)
PCP: Dr. Domenick Gong, MD  Cardiologist: saw Skeet Latch when hospitalized last week  EKG: 09/19/16 in [facility]  Stress test: pt denies ever  ECHO: 09/20/16 in [facility]  Cardiac Cath: pt denies ever  Chest x-ray: 09/18/16 in [facility]

## 2016-10-03 ENCOUNTER — Encounter (HOSPITAL_COMMUNITY): Payer: Self-pay | Admitting: *Deleted

## 2016-10-03 ENCOUNTER — Inpatient Hospital Stay (HOSPITAL_COMMUNITY)
Admission: RE | Admit: 2016-10-03 | Discharge: 2016-10-07 | DRG: 026 | Disposition: A | Discharge status: Home Health | Payer: Medicare Other | Source: Ambulatory Visit | Attending: Neurosurgery | Admitting: Neurosurgery

## 2016-10-03 ENCOUNTER — Encounter (HOSPITAL_COMMUNITY)
Admission: RE | Disposition: A | Discharge status: Home Health | Payer: Self-pay | Source: Ambulatory Visit | Attending: Neurosurgery

## 2016-10-03 ENCOUNTER — Inpatient Hospital Stay (HOSPITAL_COMMUNITY): Payer: Medicare Other | Admitting: Certified Registered Nurse Anesthetist

## 2016-10-03 ENCOUNTER — Inpatient Hospital Stay (HOSPITAL_COMMUNITY): Payer: Medicare Other | Admitting: Emergency Medicine

## 2016-10-03 DIAGNOSIS — I10 Essential (primary) hypertension: Secondary | ICD-10-CM | POA: Diagnosis present

## 2016-10-03 DIAGNOSIS — Z853 Personal history of malignant neoplasm of breast: Secondary | ICD-10-CM

## 2016-10-03 DIAGNOSIS — Z87891 Personal history of nicotine dependence: Secondary | ICD-10-CM | POA: Diagnosis not present

## 2016-10-03 DIAGNOSIS — C7931 Secondary malignant neoplasm of brain: Secondary | ICD-10-CM | POA: Diagnosis present

## 2016-10-03 DIAGNOSIS — I471 Supraventricular tachycardia: Secondary | ICD-10-CM | POA: Diagnosis not present

## 2016-10-03 DIAGNOSIS — R402253 Coma scale, best verbal response, oriented, at hospital admission: Secondary | ICD-10-CM | POA: Diagnosis present

## 2016-10-03 DIAGNOSIS — H53462 Homonymous bilateral field defects, left side: Secondary | ICD-10-CM | POA: Diagnosis present

## 2016-10-03 DIAGNOSIS — R402143 Coma scale, eyes open, spontaneous, at hospital admission: Secondary | ICD-10-CM | POA: Diagnosis present

## 2016-10-03 DIAGNOSIS — Z9889 Other specified postprocedural states: Secondary | ICD-10-CM

## 2016-10-03 DIAGNOSIS — R402363 Coma scale, best motor response, obeys commands, at hospital admission: Secondary | ICD-10-CM | POA: Diagnosis present

## 2016-10-03 DIAGNOSIS — R000 Tachycardia, unspecified: Secondary | ICD-10-CM | POA: Diagnosis not present

## 2016-10-03 HISTORY — PX: CRANIOTOMY: SHX93

## 2016-10-03 LAB — POCT I-STAT 7, (LYTES, BLD GAS, ICA,H+H)
Bicarbonate: 25 mmol/L (ref 20.0–28.0)
CALCIUM ION: 1.09 mmol/L — AB (ref 1.15–1.40)
HEMATOCRIT: 35 % — AB (ref 36.0–46.0)
HEMOGLOBIN: 11.9 g/dL — AB (ref 12.0–15.0)
O2 Saturation: 100 %
PCO2 ART: 39.6 mmHg (ref 32.0–48.0)
Potassium: 3.5 mmol/L (ref 3.5–5.1)
Sodium: 134 mmol/L — ABNORMAL LOW (ref 135–145)
TCO2: 26 mmol/L (ref 22–32)
pH, Arterial: 7.409 (ref 7.350–7.450)
pO2, Arterial: 223 mmHg — ABNORMAL HIGH (ref 83.0–108.0)

## 2016-10-03 LAB — CBC
HEMATOCRIT: 40.5 % (ref 36.0–46.0)
HEMOGLOBIN: 13.8 g/dL (ref 12.0–15.0)
MCH: 32.1 pg (ref 26.0–34.0)
MCHC: 34.1 g/dL (ref 30.0–36.0)
MCV: 94.2 fL (ref 78.0–100.0)
Platelets: 283 10*3/uL (ref 150–400)
RBC: 4.3 MIL/uL (ref 3.87–5.11)
RDW: 13.2 % (ref 11.5–15.5)
WBC: 16.8 10*3/uL — ABNORMAL HIGH (ref 4.0–10.5)

## 2016-10-03 LAB — BASIC METABOLIC PANEL
ANION GAP: 6 (ref 5–15)
BUN: 14 mg/dL (ref 6–20)
CO2: 25 mmol/L (ref 22–32)
Calcium: 7.7 mg/dL — ABNORMAL LOW (ref 8.9–10.3)
Chloride: 109 mmol/L (ref 101–111)
Creatinine, Ser: 0.63 mg/dL (ref 0.44–1.00)
Glucose, Bld: 138 mg/dL — ABNORMAL HIGH (ref 65–99)
POTASSIUM: 3.8 mmol/L (ref 3.5–5.1)
SODIUM: 140 mmol/L (ref 135–145)

## 2016-10-03 SURGERY — CRANIOTOMY TUMOR EXCISION
Anesthesia: General | Laterality: Right

## 2016-10-03 MED ORDER — FENTANYL CITRATE (PF) 100 MCG/2ML IJ SOLN
INTRAMUSCULAR | Status: AC
  Administered 2016-10-03: 50 ug via INTRAVENOUS
  Filled 2016-10-03: qty 2

## 2016-10-03 MED ORDER — CHLORHEXIDINE GLUCONATE CLOTH 2 % EX PADS: 6.0000 | MEDICATED_PAD | Freq: Once | CUTANEOUS | Status: DC

## 2016-10-03 MED ORDER — FENTANYL CITRATE (PF) 250 MCG/5ML IJ SOLN
INTRAMUSCULAR | Status: AC
  Filled 2016-10-03: qty 5

## 2016-10-03 MED ORDER — DEXAMETHASONE SODIUM PHOSPHATE 10 MG/ML IJ SOLN
INTRAMUSCULAR | Status: DC | PRN
  Administered 2016-10-03: 10 mg via INTRAVENOUS

## 2016-10-03 MED ORDER — LIDOCAINE HCL (CARDIAC) 20 MG/ML IV SOLN
INTRAVENOUS | Status: DC | PRN
  Administered 2016-10-03: 100 mg via INTRAVENOUS

## 2016-10-03 MED ORDER — LIDOCAINE 2% (20 MG/ML) 5 ML SYRINGE
INTRAMUSCULAR | Status: AC
  Filled 2016-10-03: qty 5

## 2016-10-03 MED ORDER — DOCUSATE SODIUM 100 MG PO CAPS
100.0000 mg | ORAL_CAPSULE | Freq: Two times a day (BID) | ORAL | Status: DC
  Administered 2016-10-03 – 2016-10-07 (×8): 100 mg via ORAL
  Filled 2016-10-03 (×8): qty 1

## 2016-10-03 MED ORDER — LIDOCAINE-EPINEPHRINE 1 %-1:100000 IJ SOLN
INTRAMUSCULAR | Status: DC | PRN
  Administered 2016-10-03: 10 mL

## 2016-10-03 MED ORDER — EPHEDRINE SULFATE 50 MG/ML IJ SOLN
INTRAMUSCULAR | Status: DC | PRN
  Administered 2016-10-03: 5 mg via INTRAVENOUS

## 2016-10-03 MED ORDER — DEXAMETHASONE SODIUM PHOSPHATE 10 MG/ML IJ SOLN
6.0000 mg | Freq: Four times a day (QID) | INTRAMUSCULAR | Status: AC
  Administered 2016-10-03 – 2016-10-04 (×4): 6 mg via INTRAVENOUS
  Filled 2016-10-03 (×4): qty 1

## 2016-10-03 MED ORDER — BACITRACIN ZINC 500 UNIT/GM EX OINT
TOPICAL_OINTMENT | CUTANEOUS | Status: AC
  Filled 2016-10-03: qty 28.35

## 2016-10-03 MED ORDER — ARTIFICIAL TEARS OPHTHALMIC OINT
TOPICAL_OINTMENT | OPHTHALMIC | Status: DC | PRN
  Administered 2016-10-03: 1 via OPHTHALMIC

## 2016-10-03 MED ORDER — LABETALOL HCL 5 MG/ML IV SOLN
10.0000 mg | INTRAVENOUS | Status: DC | PRN
  Filled 2016-10-03: qty 8

## 2016-10-03 MED ORDER — ATORVASTATIN CALCIUM 10 MG PO TABS
20.0000 mg | ORAL_TABLET | Freq: Every day | ORAL | Status: DC
  Administered 2016-10-03 – 2016-10-07 (×5): 20 mg via ORAL
  Filled 2016-10-03: qty 1
  Filled 2016-10-03: qty 2
  Filled 2016-10-03 (×2): qty 1
  Filled 2016-10-03: qty 2

## 2016-10-03 MED ORDER — MANNITOL 25 % IV SOLN
INTRAVENOUS | Status: DC | PRN
  Administered 2016-10-03: 25 g via INTRAVENOUS

## 2016-10-03 MED ORDER — ORAL CARE MOUTH RINSE
15.0000 mL | Freq: Two times a day (BID) | OROMUCOSAL | Status: DC
  Administered 2016-10-03 – 2016-10-04 (×2): 15 mL via OROMUCOSAL

## 2016-10-03 MED ORDER — ONDANSETRON HCL 4 MG/2ML IJ SOLN
INTRAMUSCULAR | Status: DC | PRN
  Administered 2016-10-03: 4 mg via INTRAVENOUS

## 2016-10-03 MED ORDER — 0.9 % SODIUM CHLORIDE (POUR BTL) OPTIME
TOPICAL | Status: DC | PRN
  Administered 2016-10-03: 3000 mL

## 2016-10-03 MED ORDER — DEXAMETHASONE SODIUM PHOSPHATE 10 MG/ML IJ SOLN: 10.0000 mg | Freq: Four times a day (QID) | INTRAMUSCULAR | Status: DC

## 2016-10-03 MED ORDER — HYDROMORPHONE HCL 1 MG/ML IJ SOLN: 0.5000 mg | INTRAMUSCULAR | Status: DC | PRN

## 2016-10-03 MED ORDER — AMIODARONE HCL 200 MG PO TABS
100.0000 mg | ORAL_TABLET | Freq: Two times a day (BID) | ORAL | Status: DC
  Administered 2016-10-05 – 2016-10-07 (×4): 100 mg via ORAL
  Filled 2016-10-03 (×5): qty 1

## 2016-10-03 MED ORDER — FENTANYL CITRATE (PF) 100 MCG/2ML IJ SOLN: 25.0000 ug | INTRAMUSCULAR | Status: DC | PRN

## 2016-10-03 MED ORDER — PROPOFOL 10 MG/ML IV BOLUS
INTRAVENOUS | Status: DC | PRN
  Administered 2016-10-03: 150 mg via INTRAVENOUS
  Administered 2016-10-03 (×2): 50 mg via INTRAVENOUS

## 2016-10-03 MED ORDER — LACTATED RINGERS IV SOLN
INTRAVENOUS | Status: DC
  Administered 2016-10-03 (×2): via INTRAVENOUS

## 2016-10-03 MED ORDER — LABETALOL HCL 5 MG/ML IV SOLN
INTRAVENOUS | Status: DC | PRN
  Administered 2016-10-03: 10 mg via INTRAVENOUS
  Administered 2016-10-03 (×2): 5 mg via INTRAVENOUS

## 2016-10-03 MED ORDER — LIDOCAINE-EPINEPHRINE 1 %-1:100000 IJ SOLN
INTRAMUSCULAR | Status: AC
  Filled 2016-10-03: qty 1

## 2016-10-03 MED ORDER — EPHEDRINE 5 MG/ML INJ
INTRAVENOUS | Status: AC
  Filled 2016-10-03: qty 10

## 2016-10-03 MED ORDER — PROMETHAZINE HCL 25 MG PO TABS: 12.5000 mg | ORAL_TABLET | ORAL | Status: DC | PRN

## 2016-10-03 MED ORDER — SODIUM CHLORIDE 0.9 % IV SOLN
500.0000 mg | Freq: Two times a day (BID) | INTRAVENOUS | Status: DC
  Administered 2016-10-03 – 2016-10-04 (×3): 500 mg via INTRAVENOUS
  Filled 2016-10-03 (×4): qty 5

## 2016-10-03 MED ORDER — ONDANSETRON HCL 4 MG PO TABS: 4.0000 mg | ORAL_TABLET | ORAL | Status: DC | PRN

## 2016-10-03 MED ORDER — DEXAMETHASONE SODIUM PHOSPHATE 4 MG/ML IJ SOLN: 4.0000 mg | Freq: Three times a day (TID) | INTRAMUSCULAR | Status: DC

## 2016-10-03 MED ORDER — ROCURONIUM BROMIDE 100 MG/10ML IV SOLN
INTRAVENOUS | Status: DC | PRN
  Administered 2016-10-03 (×4): 50 mg via INTRAVENOUS

## 2016-10-03 MED ORDER — HEMOSTATIC AGENTS (NO CHARGE) OPTIME
TOPICAL | Status: DC | PRN
  Administered 2016-10-03 (×2): 1 via TOPICAL

## 2016-10-03 MED ORDER — SUGAMMADEX SODIUM 200 MG/2ML IV SOLN
INTRAVENOUS | Status: AC
  Filled 2016-10-03: qty 2

## 2016-10-03 MED ORDER — BACITRACIN ZINC 500 UNIT/GM EX OINT
TOPICAL_OINTMENT | CUTANEOUS | Status: DC | PRN
  Administered 2016-10-03: 1 via TOPICAL

## 2016-10-03 MED ORDER — FENTANYL CITRATE (PF) 100 MCG/2ML IJ SOLN
25.0000 ug | INTRAMUSCULAR | Status: DC | PRN
  Administered 2016-10-03: 50 ug via INTRAVENOUS

## 2016-10-03 MED ORDER — SUGAMMADEX SODIUM 200 MG/2ML IV SOLN
INTRAVENOUS | Status: DC | PRN
  Administered 2016-10-03: 200 mg via INTRAVENOUS

## 2016-10-03 MED ORDER — GLYCOPYRROLATE 0.2 MG/ML IJ SOLN
INTRAMUSCULAR | Status: DC | PRN
  Administered 2016-10-03 (×2): 0.2 mg via INTRAVENOUS

## 2016-10-03 MED ORDER — HYDROCODONE-ACETAMINOPHEN 5-325 MG PO TABS
1.0000 | ORAL_TABLET | ORAL | Status: DC | PRN
  Administered 2016-10-03 – 2016-10-07 (×9): 1 via ORAL
  Filled 2016-10-03 (×9): qty 1

## 2016-10-03 MED ORDER — FENTANYL CITRATE (PF) 100 MCG/2ML IJ SOLN
INTRAMUSCULAR | Status: DC | PRN
  Administered 2016-10-03 (×2): 100 ug via INTRAVENOUS
  Administered 2016-10-03 (×2): 50 ug via INTRAVENOUS
  Administered 2016-10-03: 100 ug via INTRAVENOUS

## 2016-10-03 MED ORDER — CEFAZOLIN SODIUM-DEXTROSE 2-4 GM/100ML-% IV SOLN
2.0000 g | Freq: Three times a day (TID) | INTRAVENOUS | Status: AC
  Administered 2016-10-03 – 2016-10-04 (×2): 2 g via INTRAVENOUS
  Filled 2016-10-03 (×2): qty 100

## 2016-10-03 MED ORDER — SODIUM CHLORIDE 0.9 % IV SOLN
INTRAVENOUS | Status: DC | PRN
  Administered 2016-10-03 (×2): via INTRAVENOUS

## 2016-10-03 MED ORDER — SENNOSIDES-DOCUSATE SODIUM 8.6-50 MG PO TABS: 1.0000 | ORAL_TABLET | Freq: Every evening | ORAL | Status: DC | PRN

## 2016-10-03 MED ORDER — PROPOFOL 10 MG/ML IV BOLUS
INTRAVENOUS | Status: AC
  Filled 2016-10-03: qty 20

## 2016-10-03 MED ORDER — THROMBIN 20000 UNITS EX SOLR
CUTANEOUS | Status: AC
  Filled 2016-10-03: qty 20000

## 2016-10-03 MED ORDER — CEFAZOLIN SODIUM-DEXTROSE 2-4 GM/100ML-% IV SOLN
2.0000 g | INTRAVENOUS | Status: AC
  Administered 2016-10-03: 2 g via INTRAVENOUS
  Filled 2016-10-03: qty 100

## 2016-10-03 MED ORDER — SURGIFOAM 100 EX MISC
CUTANEOUS | Status: DC | PRN
  Administered 2016-10-03: 15:00:00 via TOPICAL

## 2016-10-03 MED ORDER — MOMETASONE FURO-FORMOTEROL FUM 200-5 MCG/ACT IN AERO
2.0000 | INHALATION_SPRAY | Freq: Two times a day (BID) | RESPIRATORY_TRACT | Status: DC
  Administered 2016-10-03 – 2016-10-07 (×6): 2 via RESPIRATORY_TRACT
  Filled 2016-10-03 (×2): qty 8.8

## 2016-10-03 MED ORDER — DIPHENHYDRAMINE HCL 25 MG PO CAPS: 25.0000 mg | ORAL_CAPSULE | Freq: Every evening | ORAL | Status: DC | PRN

## 2016-10-03 MED ORDER — PHENYLEPHRINE HCL 10 MG/ML IJ SOLN
INTRAMUSCULAR | Status: DC | PRN
  Administered 2016-10-03: 40 ug/min via INTRAVENOUS

## 2016-10-03 MED ORDER — ROCURONIUM BROMIDE 10 MG/ML (PF) SYRINGE
PREFILLED_SYRINGE | INTRAVENOUS | Status: AC
  Filled 2016-10-03: qty 15

## 2016-10-03 MED ORDER — DEXAMETHASONE SODIUM PHOSPHATE 4 MG/ML IJ SOLN
4.0000 mg | Freq: Four times a day (QID) | INTRAMUSCULAR | Status: DC
  Administered 2016-10-04 – 2016-10-05 (×3): 4 mg via INTRAVENOUS
  Filled 2016-10-03 (×3): qty 1

## 2016-10-03 MED ORDER — PANTOPRAZOLE SODIUM 40 MG IV SOLR
40.0000 mg | Freq: Every day | INTRAVENOUS | Status: DC
  Administered 2016-10-03 – 2016-10-04 (×2): 40 mg via INTRAVENOUS
  Filled 2016-10-03 (×2): qty 40

## 2016-10-03 MED ORDER — ONDANSETRON HCL 4 MG/2ML IJ SOLN: 4.0000 mg | INTRAMUSCULAR | Status: DC | PRN

## 2016-10-03 MED ORDER — POTASSIUM CHLORIDE IN NACL 20-0.9 MEQ/L-% IV SOLN
INTRAVENOUS | Status: DC
  Administered 2016-10-03 – 2016-10-05 (×3): via INTRAVENOUS
  Filled 2016-10-03 (×4): qty 1000

## 2016-10-03 MED ORDER — LEVETIRACETAM 500 MG PO TABS: 500.0000 mg | ORAL_TABLET | Freq: Two times a day (BID) | ORAL | Status: DC

## 2016-10-03 MED ORDER — ONDANSETRON HCL 4 MG/2ML IJ SOLN
INTRAMUSCULAR | Status: AC
  Filled 2016-10-03: qty 2

## 2016-10-03 MED ORDER — SODIUM CHLORIDE 0.9 % IR SOLN
Status: DC | PRN
  Administered 2016-10-03: 15:00:00

## 2016-10-03 MED ORDER — ARTIFICIAL TEARS OPHTHALMIC OINT
TOPICAL_OINTMENT | OPHTHALMIC | Status: AC
  Filled 2016-10-03: qty 3.5

## 2016-10-03 MED ORDER — SODIUM CHLORIDE 0.9 % IV SOLN
INTRAVENOUS | Status: DC | PRN
  Administered 2016-10-03: 15:00:00 via INTRAVENOUS

## 2016-10-03 MED ORDER — ALBUTEROL SULFATE (2.5 MG/3ML) 0.083% IN NEBU: 2.5000 mg | INHALATION_SOLUTION | RESPIRATORY_TRACT | Status: DC | PRN

## 2016-10-03 MED ORDER — PANTOPRAZOLE SODIUM 40 MG PO TBEC: 40.0000 mg | DELAYED_RELEASE_TABLET | Freq: Every day | ORAL | Status: DC

## 2016-10-03 MED ORDER — DEXAMETHASONE SODIUM PHOSPHATE 10 MG/ML IJ SOLN
10.0000 mg | INTRAMUSCULAR | Status: DC
  Filled 2016-10-03: qty 1

## 2016-10-03 MED ORDER — LABETALOL HCL 5 MG/ML IV SOLN
INTRAVENOUS | Status: AC
  Filled 2016-10-03: qty 4

## 2016-10-03 SURGICAL SUPPLY — 93 items
BAG DECANTER FOR FLEXI CONT (MISCELLANEOUS) ×3 IMPLANT
BANDAGE GAUZE 4  KLING STR (GAUZE/BANDAGES/DRESSINGS) IMPLANT
BLADE CLIPPER SURG (BLADE) ×3 IMPLANT
BLADE SURG 11 STRL SS (BLADE) ×3 IMPLANT
BNDG COHESIVE 4X5 TAN NS LF (GAUZE/BANDAGES/DRESSINGS) IMPLANT
BNDG STRETCH 4X75 STRL LF (GAUZE/BANDAGES/DRESSINGS) ×6 IMPLANT
BUR ACORN 9.0 PRECISION (BURR) ×2 IMPLANT
BUR ACORN 9.0MM PRECISION (BURR) ×1
BUR SPIRAL ROUTER 2.3 (BUR) IMPLANT
BUR SPIRAL ROUTER 2.3MM (BUR)
CANISTER SUCT 3000ML PPV (MISCELLANEOUS) ×6 IMPLANT
CARTRIDGE OIL MAESTRO DRILL (MISCELLANEOUS) ×1 IMPLANT
CLIP VESOCCLUDE MED 6/CT (CLIP) IMPLANT
CONT SPEC 4OZ CLIKSEAL STRL BL (MISCELLANEOUS) ×6 IMPLANT
COVER BACK TABLE 60X90IN (DRAPES) ×3 IMPLANT
DECANTER SPIKE VIAL GLASS SM (MISCELLANEOUS) ×3 IMPLANT
DERMABOND ADVANCED (GAUZE/BANDAGES/DRESSINGS) ×2
DERMABOND ADVANCED .7 DNX12 (GAUZE/BANDAGES/DRESSINGS) ×1 IMPLANT
DIFFUSER DRILL AIR PNEUMATIC (MISCELLANEOUS) ×3 IMPLANT
DRAPE CAMERA VIDEO/LASER (DRAPES) IMPLANT
DRAPE MICROSCOPE LEICA (MISCELLANEOUS) ×3 IMPLANT
DRAPE NEUROLOGICAL W/INCISE (DRAPES) ×3 IMPLANT
DRAPE STERI IOBAN 125X83 (DRAPES) IMPLANT
DRAPE WARM FLUID 44X44 (DRAPE) ×3 IMPLANT
DRSG OPSITE 4X5.5 SM (GAUZE/BANDAGES/DRESSINGS) ×9 IMPLANT
DURAMATRIX ONLAY 3X3 (Plate) ×3 IMPLANT
ELECT CAUTERY BLADE 6.4 (BLADE) ×3 IMPLANT
ELECT REM PT RETURN 9FT ADLT (ELECTROSURGICAL) ×3
ELECTRODE REM PT RTRN 9FT ADLT (ELECTROSURGICAL) ×1 IMPLANT
FORCEPS BIPOLAR SPETZLER 8 1.0 (NEUROSURGERY SUPPLIES) ×3 IMPLANT
GAUZE SPONGE 4X4 12PLY STRL (GAUZE/BANDAGES/DRESSINGS) ×3 IMPLANT
GAUZE SPONGE 4X4 16PLY XRAY LF (GAUZE/BANDAGES/DRESSINGS) IMPLANT
GLOVE BIO SURGEON STRL SZ 6.5 (GLOVE) IMPLANT
GLOVE BIO SURGEON STRL SZ7 (GLOVE) IMPLANT
GLOVE BIO SURGEON STRL SZ7.5 (GLOVE) IMPLANT
GLOVE BIO SURGEON STRL SZ8 (GLOVE) ×3 IMPLANT
GLOVE BIO SURGEON STRL SZ8.5 (GLOVE) IMPLANT
GLOVE BIO SURGEONS STRL SZ 6.5 (GLOVE)
GLOVE BIOGEL M 8.0 STRL (GLOVE) IMPLANT
GLOVE BIOGEL PI IND STRL 7.0 (GLOVE) IMPLANT
GLOVE BIOGEL PI INDICATOR 7.0 (GLOVE)
GLOVE ECLIPSE 6.5 STRL STRAW (GLOVE) IMPLANT
GLOVE ECLIPSE 7.0 STRL STRAW (GLOVE) IMPLANT
GLOVE ECLIPSE 7.5 STRL STRAW (GLOVE) IMPLANT
GLOVE ECLIPSE 8.0 STRL XLNG CF (GLOVE) IMPLANT
GLOVE ECLIPSE 8.5 STRL (GLOVE) IMPLANT
GLOVE EXAM NITRILE LRG STRL (GLOVE) IMPLANT
GLOVE EXAM NITRILE XL STR (GLOVE) IMPLANT
GLOVE EXAM NITRILE XS STR PU (GLOVE) IMPLANT
GLOVE INDICATOR 6.5 STRL GRN (GLOVE) IMPLANT
GLOVE INDICATOR 7.0 STRL GRN (GLOVE) IMPLANT
GLOVE INDICATOR 7.5 STRL GRN (GLOVE) IMPLANT
GLOVE INDICATOR 8.0 STRL GRN (GLOVE) IMPLANT
GLOVE INDICATOR 8.5 STRL (GLOVE) ×3 IMPLANT
GLOVE OPTIFIT SS 8.0 STRL (GLOVE) IMPLANT
GLOVE SURG SS PI 6.5 STRL IVOR (GLOVE) IMPLANT
GOWN STRL REUS W/ TWL LRG LVL3 (GOWN DISPOSABLE) ×1 IMPLANT
GOWN STRL REUS W/ TWL XL LVL3 (GOWN DISPOSABLE) ×1 IMPLANT
GOWN STRL REUS W/TWL 2XL LVL3 (GOWN DISPOSABLE) ×3 IMPLANT
GOWN STRL REUS W/TWL LRG LVL3 (GOWN DISPOSABLE) ×2
GOWN STRL REUS W/TWL XL LVL3 (GOWN DISPOSABLE) ×2
HEMOSTAT SURGICEL 2X14 (HEMOSTASIS) IMPLANT
KIT BASIN OR (CUSTOM PROCEDURE TRAY) ×3 IMPLANT
KIT ROOM TURNOVER OR (KITS) ×3 IMPLANT
NEEDLE HYPO 25X1 1.5 SAFETY (NEEDLE) ×3 IMPLANT
NS IRRIG 1000ML POUR BTL (IV SOLUTION) ×9 IMPLANT
OIL CARTRIDGE MAESTRO DRILL (MISCELLANEOUS) ×3
PACK CRANIOTOMY (CUSTOM PROCEDURE TRAY) ×3 IMPLANT
PAD ARMBOARD 7.5X6 YLW CONV (MISCELLANEOUS) ×9 IMPLANT
PATTIES SURGICAL .25X.25 (GAUZE/BANDAGES/DRESSINGS) IMPLANT
PATTIES SURGICAL .5 X.5 (GAUZE/BANDAGES/DRESSINGS) IMPLANT
PATTIES SURGICAL .5 X3 (DISPOSABLE) ×15 IMPLANT
PATTIES SURGICAL 1X1 (DISPOSABLE) ×3 IMPLANT
PLATE 1.5/0.5 22MM BURR HO (Plate) ×9 IMPLANT
RUBBERBAND STERILE (MISCELLANEOUS) ×6 IMPLANT
SCREW SELF DRILL HT 1.5/4MM (Screw) ×51 IMPLANT
SPONGE NEURO XRAY DETECT 1X3 (DISPOSABLE) IMPLANT
SPONGE SURGIFOAM ABS GEL 100 (HEMOSTASIS) ×3 IMPLANT
SPONGE SURGIFOAM ABS GEL 12-7 (HEMOSTASIS) IMPLANT
STAPLER VISISTAT 35W (STAPLE) ×3 IMPLANT
STOCKINETTE 6  STRL (DRAPES) ×2
STOCKINETTE 6 STRL (DRAPES) ×1 IMPLANT
SUT ETHILON 3 0 FSL (SUTURE) ×6 IMPLANT
SUT NURALON 4 0 TR CR/8 (SUTURE) ×9 IMPLANT
SUT VIC AB 2-0 CT1 18 (SUTURE) ×9 IMPLANT
SYR CONTROL 10ML LL (SYRINGE) ×3 IMPLANT
TOWEL GREEN STERILE (TOWEL DISPOSABLE) ×3 IMPLANT
TOWEL GREEN STERILE FF (TOWEL DISPOSABLE) IMPLANT
TRAY FOLEY W/METER SILVER 16FR (SET/KITS/TRAYS/PACK) ×3 IMPLANT
TUBE CONNECTING 12'X1/4 (SUCTIONS) ×1
TUBE CONNECTING 12X1/4 (SUCTIONS) ×2 IMPLANT
UNDERPAD 30X30 (UNDERPADS AND DIAPERS) ×3 IMPLANT
WATER STERILE IRR 1000ML POUR (IV SOLUTION) ×3 IMPLANT

## 2016-10-03 NOTE — Anesthesia Preprocedure Evaluation (Signed)
Anesthesia Evaluation  Patient identified by MRN, date of birth, ID band Patient awake    Reviewed: Allergy & Precautions, NPO status , Patient's Chart, lab work & pertinent test results  Airway Mallampati: I  TM Distance: >3 FB Neck ROM: Full    Dental   Pulmonary asthma , former smoker,    Pulmonary exam normal        Cardiovascular hypertension, Pt. on medications Normal cardiovascular exam     Neuro/Psych    GI/Hepatic   Endo/Other    Renal/GU      Musculoskeletal   Abdominal   Peds  Hematology   Anesthesia Other Findings   Reproductive/Obstetrics                             Anesthesia Physical Anesthesia Plan  ASA: III  Anesthesia Plan: General   Post-op Pain Management:    Induction: Intravenous  PONV Risk Score and Plan: 3 and Ondansetron, Dexamethasone, Midazolam and Treatment may vary due to age or medical condition  Airway Management Planned: Oral ETT  Additional Equipment: Arterial line  Intra-op Plan:   Post-operative Plan: Extubation in OR  Informed Consent: I have reviewed the patients History and Physical, chart, labs and discussed the procedure including the risks, benefits and alternatives for the proposed anesthesia with the patient or authorized representative who has indicated his/her understanding and acceptance.     Plan Discussed with: CRNA and Surgeon  Anesthesia Plan Comments:         Anesthesia Quick Evaluation

## 2016-10-03 NOTE — Anesthesia Procedure Notes (Signed)
Procedure Name: Intubation Date/Time: 10/03/2016 2:05 PM Performed by: Candis Shine Pre-anesthesia Checklist: Patient identified, Emergency Drugs available, Suction available and Patient being monitored Patient Re-evaluated:Patient Re-evaluated prior to induction Oxygen Delivery Method: Circle System Utilized Preoxygenation: Pre-oxygenation with 100% oxygen Induction Type: IV induction Ventilation: Mask ventilation without difficulty Laryngoscope Size: Mac and 3 Grade View: Grade I Tube type: Oral Tube size: 7.5 mm Number of attempts: 1 Airway Equipment and Method: Stylet Placement Confirmation: ETT inserted through vocal cords under direct vision,  positive ETCO2 and breath sounds checked- equal and bilateral Secured at: 22 cm Tube secured with: Tape Dental Injury: Teeth and Oropharynx as per pre-operative assessment

## 2016-10-03 NOTE — Anesthesia Postprocedure Evaluation (Signed)
Anesthesia Post Note  Patient: [place]  Procedure(s) Performed: Procedure(s) (LRB): Right Occipital CRANIOTOMY TUMOR EXCISION with Curve (Right)     Patient location during evaluation: PACU Anesthesia Type: General Level of consciousness: awake Pain management: pain level controlled Vital Signs Assessment: post-procedure vital signs reviewed and stable Respiratory status: spontaneous breathing Cardiovascular status: stable Anesthetic complications: no    Last Vitals:  Vitals:   10/03/16 1918 10/03/16 1933  BP: (!) 150/79 (!) 143/85  Pulse: (!) 40 (!) 42  Resp: 16 16  Temp:    SpO2: 98% 97%    Last Pain:  Vitals:   10/03/16 1935  TempSrc:   PainSc: 0-No pain                 Liora Myles

## 2016-10-03 NOTE — Transfer of Care (Signed)
Immediate Anesthesia Transfer of Care Note  Patient: [place]  Procedure(s) Performed: Procedure(s): Right Occipital CRANIOTOMY TUMOR EXCISION with Curve (Right)  Patient Location: PACU  Anesthesia Type:General  Level of Consciousness: awake, alert  and oriented  Airway & Oxygen Therapy: Patient Spontanous Breathing and Patient connected to nasal cannula oxygen  Post-op Assessment: Report given to RN and Post -op Vital signs reviewed and stable  Post vital signs: Reviewed and stable  Last Vitals:  Vitals:   10/03/16 1127  BP: (!) 189/83  Pulse: (!) 58  Resp: 18  Temp: 36.4 C  SpO2: 99%    Last Pain:  Vitals:   10/03/16 1151  TempSrc:   PainSc: 7          Complications: No apparent anesthesia complications

## 2016-10-03 NOTE — Op Note (Signed)
Operative diagnosis: Right occipital brain metastases  Postoperative diagnosis: Same with preliminary path consistent with breast metastasis  Procedure:stereotactic craniotomy utilizing the BrainLab curve navigation system for resection of right occipital brain metastasis  Surgeon: Dominica Severin Haydee Jabbour  Asst.: Dr. Consuella Lose  Anesthesia: Gen.  EBL: Minimal  History of present illness: Patient is a very pleasant 76 year old female with history of breast cancer who presented to the hospital week ago with headaches nausea vomiting extensive workup revealed multiple brain metastases about a very large 7 cm right occipital mass 2 smaller subcentimeter masses left cerebellum and left parietal occipital as well as multiple lymph nodes above and below the diaphragm. When this was biopsied path came back consistent with breast metastasis. Extensively reviewed the path and went over the treatment options with patient and family and we elected to proceed forward with resection of the large right occipital lesion. I reviewed the risks benefits of the operation as well as perioperative course expectations of outcome alternatives of surgery and they understood and agreed to proceed forward.  Operative procedure: Patient brought into the or was induced under general anesthesia positioned prone in pins the head slightly extended and turned to the left exposing the right occiput the curve a stereotactic system was then registered and after adequate registration with had been achieved and confirmed I shaved the back of her head prepped and draped the right occiput routine sterile fashion utilizing the navigation system mapped out my horseshoe skin scalp incision and turned a craniotomy flap along the right side of the superior sagittal sinus and right above the transverse sinus. Open up the dura flapping it towards the transverse sinus the tumor had invaded through the cortical surface on the undersurface of the dura just  at the level of the transverse sinus and above. This was dissected off and did resect that whole section of dura and sent it for pathology. Then the brain was markedly expanded and made a corticectomy working around identify the margin the tumor did appear to be consistent with metastatic disease preliminary path confirmed metastatic carcinoma.so there was somewhat of a margin around the neck that developed this around the edematous white matter and resected a large amount of tumor this tumor on preoperative imaging had invaded the ventricles so I plan to leave a rim of the tumor along the ependymal wall and after resecting the largest component of the tumor there was a piece that I resected that did communicate with the trigone of the ventricle and this was confirmed with curved navigation which had given [place] misleading information secondary to the brain slack relaxation and loss of of accurate registration. However this was a small entry into the ventricle and packed that with a large piece of Gelfoam then the left a residual rim of tumor along with felt to be lateral ventricular wall resected everything else to where I had cleaned edematous white matter margins all around inferior superiorly medially and laterally. There was an copious irrigated meticulous hemostasis was maintained placed some Surgicel over the floor and the base of the resection bed as well as over the piece of Gelfoam walling off that opening into the ventricle. Reconstructed the dura with dura matrix I did suture in place and just some anchor sutures to hold it in place reattached the bone flap with the Biomet plating system and then closed the scalp with interrupted Vicryls and a running nylon. Wound was dressed patient recovered in stable condition. At the end the case all needle counts  sponge counts were correct.

## 2016-10-03 NOTE — Anesthesia Procedure Notes (Signed)
Arterial Line Insertion Start/End9/06/2016 12:35 PM, 10/03/2016 12:43 PM Performed by: Wilburn Cornelia, CRNA  Patient location: Pre-op. Preanesthetic checklist: patient identified, IV checked, risks and benefits discussed, surgical consent, monitors and equipment checked and pre-op evaluation Left, radial was placed Catheter size: 20 G Hand hygiene performed  and maximum sterile barriers used  Allen's test indicative of satisfactory collateral circulation Attempts: 1 Procedure performed without using ultrasound guided technique. Following insertion, Biopatch. Post procedure assessment: normal  Patient tolerated the procedure well with no immediate complications.

## 2016-10-03 NOTE — H&P (Signed)
Deanna Washington is an 76 y.o. female.   Chief Complaint: Headaches HPI: 76 year old with a history of breast cancer was admitted with headaches and nausea and vomiting workup revealed a large right occipital mass as well as 2 smaller satellite lesions in the left hemisphere systemic workup revealed adenopathy above and below the diaphragm biopsy was consistent with either reactive node or metastatic breast cancer. Patient was recommended craniotomy for biopsy and possible resection of right occipital mass. I extensively went over the risks and benefits of the difficulties unique to this larger lesion with extension of the ventricle and that I could not do a complete resection that we would be subtotal at best but this would allow [place] diagnosis and Craig further treatment better. Interoperative operation and that the extension of her quality of life was unpredictable. Patient family extensively about this and talked about and elected to proceed forward. So I explained the risks of this with her as well as perioperative course expectations of outcome and alternatives to surgery and he understood and agreed to proceed forward.  Past Medical History:  Diagnosis Date  . Asthma   . Breast cancer [facility])    believes it was in 2010  . HTN (hypertension)     Past Surgical History:  Procedure Laterality Date  . BREAST LUMPECTOMY     lumpectomy    History reviewed. No pertinent family history. Social History:  reports that she has quit smoking. She has never used smokeless tobacco. She reports that she drinks about 1.2 oz of alcohol per week . She reports that she does not use drugs.  Allergies: No Known Allergies  Medications Prior to Admission  Medication Sig Dispense Refill  . ADVAIR DISKUS 500-50 MCG/DOSE AEPB Place 1 puff into alternate nostrils 2 (two) times daily.    Marland Kitchen amiodarone (PACERONE) 200 MG tablet Take 100 mg by mouth 2 (two) times daily. 1/2 tablet by mouth twice a day     .  atorvastatin (LIPITOR) 20 MG tablet Take 1 tablet (20 mg total) by mouth daily. 30 tablet 0  . dexamethasone (DECADRON) 4 MG tablet Take 2 tablets (8 mg total) by mouth 2 (two) times daily with a meal. 120 tablet 0  . levETIRAcetam (KEPPRA) 500 MG tablet Take 1 tablet (500 mg total) by mouth 2 (two) times daily. 60 tablet 0  . pantoprazole (PROTONIX) 40 MG tablet Take 1 tablet (40 mg total) by mouth daily. 30 tablet 0  . PROAIR HFA 108 (90 Base) MCG/ACT inhaler Place 1-2 puffs into alternate nostrils every 4 (four) hours as needed for wheezing.    . diphenhydrAMINE (BENADRYL) 25 mg capsule Take 25 mg by mouth at bedtime as needed for sleep.       Results for orders placed or performed during the hospital encounter of 10/02/16 (from the past 48 hour(s))  Basic metabolic panel     Status: Abnormal   Collection Time: 10/02/16  8:34 AM  Result Value Ref Range   Sodium 136 135 - 145 mmol/L   Potassium 3.8 3.5 - 5.1 mmol/L   Chloride 105 101 - 111 mmol/L   CO2 22 22 - 32 mmol/L   Glucose, Bld 152 (H) 65 - 99 mg/dL   BUN 19 6 - 20 mg/dL   Creatinine, Ser 0.91 0.44 - 1.00 mg/dL   Calcium 8.8 (L) 8.9 - 10.3 mg/dL   GFR calc non Af Amer >60 >60 mL/min   GFR calc Af Amer >60 >60 mL/min  Comment: (NOTE) The eGFR has been calculated using the CKD EPI equation. This calculation has not been validated in all clinical situations. eGFR's persistently <60 mL/min signify possible Chronic Kidney Disease.    Anion gap 9 5 - 15  CBC     Status: Abnormal   Collection Time: 10/02/16  8:34 AM  Result Value Ref Range   WBC 21.8 (H) 4.0 - 10.5 K/uL   RBC 4.91 3.87 - 5.11 MIL/uL   Hemoglobin 15.5 (H) 12.0 - 15.0 g/dL   HCT 46.1 (H) 36.0 - 46.0 %   MCV 93.9 78.0 - 100.0 fL   MCH 31.6 26.0 - 34.0 pg   MCHC 33.6 30.0 - 36.0 g/dL   RDW 13.0 11.5 - 15.5 %   Platelets 308 150 - 400 K/uL  Type and screen All Cardiac and thoracic surgeries, spinal fusions, myomectomies, craniotomies, colon & liver resections,  total joint revisions, same day c-section with placenta previa or accreta.     Status: None   Collection Time: 10/02/16  8:41 AM  Result Value Ref Range   ABO/RH(D) O POS    Antibody Screen NEG    Sample Expiration 10/16/2016    Extend sample reason NO TRANSFUSIONS OR PREGNANCY IN THE PAST 3 MONTHS   ABO/Rh     Status: None   Collection Time: 10/02/16  8:41 AM  Result Value Ref Range   ABO/RH(D) O POS    No results found.  Review of Systems  Neurological: Positive for dizziness and headaches.    Blood pressure (!) 189/83, pulse (!) 58, temperature 97.6 F (36.4 C), temperature source Oral, resp. rate 18, height 5' 8"  (1.727 m), weight 77.6 kg (171 lb), SpO2 99 %. Physical Exam  Constitutional: She is oriented to person, place, and time. She appears well-developed and well-nourished.  HENT:  Head: Normocephalic.  Eyes: Pupils are equal, round, and reactive to light.  Neck: Normal range of motion.  GI: Soft.  Neurological: She is alert and oriented to person, place, and time. She has normal strength. GCS eye subscore is 4. GCS verbal subscore is 5. GCS motor subscore is 6.  Alert and oriented patient is awake pupils are equal slight left homonymous hemianopsia strength 5 out of 5 upper and lower extremities  Skin: Skin is warm and dry.     Assessment/Plan Patient presents for right occipital craniotomy for section of occipital mass  Deanna Washington P, MD 10/03/2016, 1:38 PM

## 2016-10-04 ENCOUNTER — Encounter (HOSPITAL_COMMUNITY): Payer: Self-pay | Admitting: Neurosurgery

## 2016-10-04 ENCOUNTER — Inpatient Hospital Stay (HOSPITAL_COMMUNITY): Payer: Medicare Other

## 2016-10-04 LAB — BASIC METABOLIC PANEL
ANION GAP: 6 (ref 5–15)
BUN: 12 mg/dL (ref 6–20)
CHLORIDE: 106 mmol/L (ref 101–111)
CO2: 26 mmol/L (ref 22–32)
Calcium: 7.9 mg/dL — ABNORMAL LOW (ref 8.9–10.3)
Creatinine, Ser: 0.82 mg/dL (ref 0.44–1.00)
GFR calc non Af Amer: >60 mL/min (ref >60)
GLUCOSE: 136 mg/dL — AB (ref 65–99)
Potassium: 4.3 mmol/L (ref 3.5–5.1)
Sodium: 138 mmol/L (ref 135–145)

## 2016-10-04 MED ORDER — GADOBENATE DIMEGLUMINE 529 MG/ML IV SOLN
17.0000 mL | Freq: Once | INTRAVENOUS | Status: AC | PRN
  Administered 2016-10-04: 17 mL via INTRAVENOUS

## 2016-10-04 MED ORDER — LABETALOL HCL 20 MG/4ML IV SOSY
10.0000 mg | PREFILLED_SYRINGE | INTRAVENOUS | Status: DC | PRN
  Filled 2016-10-04: qty 8

## 2016-10-04 MED ORDER — LABETALOL HCL 5 MG/ML IV SOLN: 10.0000 mg | INTRAVENOUS | Status: DC | PRN

## 2016-10-04 NOTE — Progress Notes (Signed)
COURTESY NOTE:  Mersedes did well with her surgery. Per Dr Windy Carina note, preliminary suggests breast cancer, not primary CNS malignancy. The prognosis panel is pending--it would be helpful if the cancer proved ER or HER-2 positive as that would spare the patient the need for chemotherapy.  She is scheduled for f/u in our office 09/19  Appreciate your help to this patinet!

## 2016-10-04 NOTE — Care Management Note (Signed)
Case Management Note  Patient Details  Name: Deanna Washington MRN: [id number] Date of Birth: 04-27-1940  Subjective/Objective:                 Patient admitted from home w children. Active w [facility] for PT pta. 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 Continues recovery in ICU.    Action/Plan:  CM will continue to follow for DC planning.  Expected Discharge Date:                  Expected Discharge Plan:     In-House Referral:     Discharge planning Services  CM Consult  Post Acute Care Choice:    Choice offered to:     DME Arranged:    DME Agency:     HH Arranged:    HH Agency:     Status of Service:  In process, will continue to follow  If discussed at Long Length of Stay Meetings, dates discussed:    Additional Comments:  Carles Collet, RN 10/04/2016, 8:29 AM

## 2016-10-05 MED ORDER — PANTOPRAZOLE SODIUM 40 MG PO TBEC
40.0000 mg | DELAYED_RELEASE_TABLET | Freq: Every day | ORAL | Status: DC
Start: 2016-10-05 — End: 2016-10-07
  Administered 2016-10-05 – 2016-10-06 (×2): 40 mg via ORAL
  Filled 2016-10-05 (×2): qty 1

## 2016-10-05 MED ORDER — DEXAMETHASONE 4 MG PO TABS
4.0000 mg | ORAL_TABLET | Freq: Four times a day (QID) | ORAL | Status: AC
  Administered 2016-10-05 (×2): 4 mg via ORAL
  Filled 2016-10-05 (×2): qty 1

## 2016-10-05 MED ORDER — LEVETIRACETAM 500 MG PO TABS
500.0000 mg | ORAL_TABLET | Freq: Two times a day (BID) | ORAL | Status: DC
  Administered 2016-10-05 – 2016-10-07 (×5): 500 mg via ORAL
  Filled 2016-10-05 (×5): qty 1

## 2016-10-05 MED ORDER — DEXAMETHASONE 4 MG PO TABS
4.0000 mg | ORAL_TABLET | Freq: Three times a day (TID) | ORAL | Status: DC
  Administered 2016-10-06: 4 mg via ORAL
  Filled 2016-10-05: qty 1

## 2016-10-05 NOTE — Progress Notes (Signed)
Surgical dressing noted dry and intact. Pt denies pain or discomfort. Will continue to monitor. Pt ambulated standby assist x1 to the bathroom. Gait steady. No noted distress.

## 2016-10-05 NOTE — Progress Notes (Addendum)
NEUROSURGERY PROGRESS NOTE  Doing well. Complains of appropriate headache occasionally. No numbness, tingling or weakness Good strength and sensation Incision oozing with serosanginous fluid  Temp:  [97.7 F (36.5 C)-98.9 F (37.2 C)] 98 F (36.7 C) (09/08 0400) Pulse Rate:  [25-91] 70 (09/08 0700) Resp:  [16-34] 29 (09/08 0700) BP: (112-161)/(58-99) 158/87 (09/08 0700) SpO2:  [90 %-97 %] 91 % (09/08 0700)  Plan: Dressing saturated from last 12 hours. Continue to change as needed. Will plan to ambulate today and transfer to floor. Doing well from NS perspective.   Eleonore Chiquito, NP 10/05/2016 7:49 AM

## 2016-10-05 NOTE — Progress Notes (Signed)
Pt transferred from 4N with no noted distress. Pt stable, neuro intact. Pt oriented to room. Safety measures in place. Call bell within reach. Will continue to monitor.

## 2016-10-05 NOTE — Evaluation (Signed)
Physical Therapy Evaluation Patient Details Name: Deanna Washington MRN: [id number] DOB: 10-31-1940 Today's Date: 10/05/2016   History of Present Illness  76yo female with history of breast CA who presented to hospital previously for headaches, nausea/vomiting on 8/22. Work revealed 7 cm right occipital mass 2 smaller subcentimeter masses left cerebellum and left parietal occipital as well as multiple lymph nodes above and below the diaphragm. Patient presented on 9/6 for craniotomy and tumor resection.   Clinical Impression  Patient was independent with all self-care and mobility activities prior to admission. Patient has mild cognitive, strength, and balance deficits requiring further skilled therapy services. Patient demonstrated difficulty with higher level balance tasks that would impact her ability to complete self-care and mobility tasks for herself and caring for her husband. Considering the patient is primary caregiver for husband, HHPT suggested for further rehabilitation for return of prior level of function. Will follow acutely as indicated below. Progress as able.    Follow Up Recommendations Home health PT    Equipment Recommendations   (tbd)    Recommendations for Other Services       Precautions / Restrictions Precautions Precautions: Fall Restrictions Weight Bearing Restrictions: No      Mobility  Bed Mobility               General bed mobility comments: Patient received up and moving, did not assess bed mobility.  Transfers Overall transfer level: Needs assistance Equipment used: Rolling walker (2 wheeled)   Sit to Stand: Supervision         General transfer comment: supervision for safety  Ambulation/Gait Ambulation/Gait assistance: Supervision Ambulation Distance (Feet): 250 Feet Assistive device: Rolling walker (2 wheeled) Gait Pattern/deviations: Step-through pattern;Trunk flexed;Narrow base of support Gait velocity: decreased Gait velocity  interpretation: Below normal speed for age/gender General Gait Details: Pt ambulated ~ 217ft with RW and supervision from PT. Patient demonstrated mild difficulty with higher level balance activities during ambulation. Moved eyes more so than head when asked to look to different directions. No reports of dizziness.  Stairs            Wheelchair Mobility    Modified Rankin (Stroke Patients Only)       Balance Overall balance assessment: Needs assistance Sitting-balance support: Feet supported Sitting balance-Leahy Scale: Normal     Standing balance support: During functional activity;Bilateral upper extremity supported Standing balance-Leahy Scale: Fair Standing balance comment: Standing balance not challenged but able to maintain balance in static standing.             High level balance activites: Turns;Head turns (patient limited with movement of head during highlevel tasks) High Level Balance Comments: When ambulating looking different directions, pt does not report dizziness. Pt demonstrates decreased ability to perform higher level balance challenges.              Pertinent Vitals/Pain Pain Assessment: No/denies pain    Home Living Family/patient expects to be discharged to:: Private residence Living Arrangements: Spouse/significant other;Children Available Help at Discharge: Family Type of Home: House Home Access: Ramped entrance     Home Layout: One level Home Equipment: Environmental consultant - 2 wheels;Tub bench;Bedside commode;Grab bars - tub/shower;Grab bars - toilet      Prior Function Level of Independence: Independent         Comments: Pt was independent with all aspects of self care and mobility. Pt is the primary caregiver for her husband.     Hand Dominance   Dominant Hand: Right    Extremity/Trunk  Assessment   Upper Extremity Assessment Upper Extremity Assessment: Defer to OT evaluation    Lower Extremity Assessment Lower Extremity  Assessment: RLE deficits/detail RLE Deficits / Details: Bilateral LE edema with significantly greater edema in RLE. RLE strength 3+/5 compared to LLE 4+/5. Sensation intact.        Communication   Communication: No difficulties  Cognition Arousal/Alertness: Awake/alert Behavior During Therapy: WFL for tasks assessed/performed Overall Cognitive Status: Impaired/Different from baseline Area of Impairment: Attention                   Current Attention Level: Alternating           General Comments: Patient with some lability and jovial communication during session      General Comments General comments (skin integrity, edema, etc.): Significant edema in BLE, RLE > LLE.    Exercises     Assessment/Plan    PT Assessment Patient needs continued PT services  PT Problem List Decreased strength;Decreased balance       PT Treatment Interventions DME instruction;Gait training;Functional mobility training;Therapeutic activities;Therapeutic exercise;Balance training;Neuromuscular re-education;Patient/family education    PT Goals (Current goals can be found in the Care Plan section)  Acute Rehab PT Goals Patient Stated Goal: return home PT Goal Formulation: With patient Time For Goal Achievement: 10/19/16 Potential to Achieve Goals: Good    Frequency Min 3X/week   Barriers to discharge        Co-evaluation               AM-PAC PT "6 Clicks" Daily Activity  Outcome Measure Difficulty turning over in bed (including adjusting bedclothes, sheets and blankets)?: A Little Difficulty moving from lying on back to sitting on the side of the bed? : A Little Difficulty sitting down on and standing up from a chair with arms (e.g., wheelchair, bedside commode, etc,.)?: None Help needed moving to and from a bed to chair (including a wheelchair)?: A Little Help needed walking in hospital room?: None Help needed climbing 3-5 steps with a railing? : A Little 6 Click Score:  20    End of Session Equipment Utilized During Treatment: Gait belt Activity Tolerance: Patient tolerated treatment well Patient left: in chair;with call bell/phone within reach;with chair alarm set Nurse Communication: Mobility status PT Visit Diagnosis: Difficulty in walking, not elsewhere classified (R26.2)    Time: 1047-1106 PT Time Calculation (min) (ACUTE ONLY): 19 min   Charges:   PT Evaluation $PT Eval Moderate Complexity: 1 Mod     PT G CodesSindy Guadeloupe, SPT [phone] office  Margarita Grizzle 10/05/2016, 11:55 AM

## 2016-10-06 DIAGNOSIS — R000 Tachycardia, unspecified: Secondary | ICD-10-CM

## 2016-10-06 LAB — BASIC METABOLIC PANEL
Anion gap: 8 (ref 5–15)
BUN: 17 mg/dL (ref 6–20)
CHLORIDE: 102 mmol/L (ref 101–111)
CO2: 24 mmol/L (ref 22–32)
CREATININE: 0.63 mg/dL (ref 0.44–1.00)
Calcium: 8.3 mg/dL — ABNORMAL LOW (ref 8.9–10.3)
GFR calc Af Amer: >60 mL/min (ref >60)
GFR calc non Af Amer: >60 mL/min (ref >60)
GLUCOSE: 129 mg/dL — AB (ref 65–99)
Potassium: 3.7 mmol/L (ref 3.5–5.1)
SODIUM: 134 mmol/L — AB (ref 135–145)

## 2016-10-06 LAB — MAGNESIUM: MAGNESIUM: 1.8 mg/dL (ref 1.7–2.4)

## 2016-10-06 MED ORDER — DEXAMETHASONE 4 MG PO TABS
4.0000 mg | ORAL_TABLET | Freq: Two times a day (BID) | ORAL | Status: DC
  Administered 2016-10-06 – 2016-10-07 (×2): 4 mg via ORAL
  Filled 2016-10-06 (×2): qty 1

## 2016-10-06 NOTE — Progress Notes (Addendum)
Patient ID: Deanna Washington, female   DOB: [birth year], 76 y.o.   MRN: [id number] Deanna Washington looks great very minimal headache no nausea  Awake alert neurologically intact dressing is moderate saturation will change the dressing.  Postop day 3 craniotomy for evacuation of right occipital metastasis. Postoperative dictation doi mobilization today and probable discharge tomorrow.we'll discharge on Decadron 2 mg by mouth twice a day with follow-up with me in 2 weeks medical oncology and radiation oncology.

## 2016-10-06 NOTE — Progress Notes (Signed)
Md on call informed that patient had SVT 133 beats Vital signs 141/94 HR 89 98 room air asymptomatic. CMT called notified of the SVT. Arthor Captain LPN

## 2016-10-06 NOTE — Consult Note (Addendum)
Medical Consultation   Deanna Washington  SHF:[id number]  DOB: 02/07/1940  DOA: 10/03/2016  PCP: Haywood Pao, MD   Outpatient Specialists: Dr. Saintclair Halsted NS   Requesting physician: Dr. Vertell Limber  Reason for consultation: "133 beat run of SVT"   History of Present Illness: Deanna Washington is an 76 y.o. female POD 3 craniotomy for evacuation of right occipital mets.  Started on decadron.  Transferred to floor today with plans for DC tomorrow.  Dr. Vertell Limber was called with report of "SVT 133 beats", patient asymptomatic.  E.Link put in for BMP and Mg level and suggested medicine or cards consult.  Medicine has been called.  On my review however, it seems the patient had about 28 or so beat run of sinus tach at a HEART RATE of 133 beats per min according to review of the monitor.  Rhythm strips have been printed and placed in the patients paper chart.    Review of Systems:  ROS As per HPI otherwise 10 point review of systems negative.    Past Medical History: Past Medical History:  Diagnosis Date  . Asthma   . Breast cancer [facility])    believes it was in 2010  . HTN (hypertension)     Past Surgical History: Past Surgical History:  Procedure Laterality Date  . BREAST LUMPECTOMY     lumpectomy  . CRANIOTOMY Right 10/03/2016   Procedure: Right Occipital CRANIOTOMY TUMOR EXCISION with Curve;  Surgeon: Kary Kos, MD;  Location: [facility];  Service: Neurosurgery;  Laterality: Right;     Allergies:  No Known Allergies   Social History:  reports that she has quit smoking. She has never used smokeless tobacco. She reports that she drinks about 1.2 oz of alcohol per week . She reports that she does not use drugs.   Family History: History reviewed. No pertinent family history.   Physical Exam: Vitals:   10/06/16 1200 10/06/16 1811 10/06/16 2010 10/06/16 2058  BP: (!) 149/94 138/83 (!) 141/94 130/90  Pulse: 71 84 87 85  Resp: 18 18  18   Temp: 98.1 F (36.7 C) 98 F  (36.7 C)  97.7 F (36.5 C)  TempSrc: Oral Oral  Oral  SpO2: 97% 98% 98% 95%  Weight:      Height:        Constitutional: Alert and awake, oriented x3, not in any acute distress. Eyes: PERLA, EOMI, irises appear normal, anicteric sclera,  ENMT: external ears and nose appear normal,            Lips appears normal, oropharynx mucosa, tongue, posterior pharynx appear normal  Neck: neck appears normal, no masses, normal ROM, no thyromegaly, no JVD  CVS: S1-S2 clear, no murmur rubs or gallops, no LE edema, normal pedal pulses  Respiratory:  clear to auscultation bilaterally, no wheezing, rales or rhonchi. Respiratory effort normal. No accessory muscle use.  Abdomen: soft nontender, nondistended, normal bowel sounds, no hepatosplenomegaly, no hernias  Musculoskeletal: : no cyanosis, clubbing or edema noted bilaterally Neuro: Cranial nerves II-XII intact, strength, sensation, reflexes Psych: judgement and insight appear normal, stable mood and affect, mental status Skin: Posterior craniotomy dressing is in place and C/D/I   Data reviewed:  I have personally reviewed following labs and imaging studies Labs:  CBC:  Recent Labs Lab 10/02/16 0834 10/03/16 1506 10/03/16 1920  WBC 21.8*  --  16.8*  HGB 15.5* 11.9* 13.8  HCT 46.1* 35.0*  40.5  MCV 93.9  --  94.2  PLT 308  --  102    Basic Metabolic Panel:  Recent Labs Lab 10/02/16 0834 10/03/16 1506 10/03/16 1920 10/04/16 0316  NA 136 134* 140 138  K 3.8 3.5 3.8 4.3  CL 105  --  109 106  CO2 22  --  25 26  GLUCOSE 152*  --  138* 136*  BUN 19  --  14 12  CREATININE 0.91  --  0.63 0.82  CALCIUM 8.8*  --  7.7* 7.9*   GFR Estimated Creatinine Clearance: 65.9 mL/min (by C-G formula based on SCr of 0.82 mg/dL). Liver Function Tests: No results for input(s): AST, ALT, ALKPHOS, BILITOT, PROT, ALBUMIN in the last 168 hours. No results for input(s): LIPASE, AMYLASE in the last 168 hours. No results for input(s): AMMONIA in the last  168 hours. Coagulation profile No results for input(s): INR, PROTIME in the last 168 hours.  Cardiac Enzymes: No results for input(s): CKTOTAL, CKMB, CKMBINDEX, TROPONINI in the last 168 hours. BNP: Invalid input(s): POCBNP CBG: No results for input(s): GLUCAP in the last 168 hours. D-Dimer No results for input(s): DDIMER in the last 72 hours. Hgb A1c No results for input(s): HGBA1C in the last 72 hours. Lipid Profile No results for input(s): CHOL, HDL, LDLCALC, TRIG, CHOLHDL, LDLDIRECT in the last 72 hours. Thyroid function studies No results for input(s): TSH, T4TOTAL, T3FREE, THYROIDAB in the last 72 hours.  Invalid input(s): FREET3 Anemia work up No results for input(s): VITAMINB12, FOLATE, FERRITIN, TIBC, IRON, RETICCTPCT in the last 72 hours. Urinalysis    Component Value Date/Time   COLORURINE AMBER (A) 09/18/2016 1659   APPEARANCEUR HAZY (A) 09/18/2016 1659   LABSPEC 1.027 09/18/2016 1659   PHURINE 5.0 09/18/2016 1659   GLUCOSEU NEGATIVE 09/18/2016 1659   HGBUR SMALL (A) 09/18/2016 1659   BILIRUBINUR NEGATIVE 09/18/2016 1659   KETONESUR 80 (A) 09/18/2016 1659   PROTEINUR 30 (A) 09/18/2016 1659   NITRITE NEGATIVE 09/18/2016 1659   LEUKOCYTESUR MODERATE (A) 09/18/2016 1659     Microbiology No results found for this or any previous visit (from the past 240 hour(s)).     Inpatient Medications:   Scheduled Meds: . amiodarone  100 mg Oral BID  . atorvastatin  20 mg Oral Daily  . dexamethasone  4 mg Oral Q12H  . docusate sodium  100 mg Oral BID  . levETIRAcetam  500 mg Oral BID  . mometasone-formoterol  2 puff Inhalation BID  . pantoprazole  40 mg Oral QHS   Continuous Infusions: . lactated ringers Stopped (10/05/16 0800)     Radiological Exams on Admission: No results found.  Impression/Recommendations Active Problems:   Status post craniotomy   Metastasis to brain ([place])     1. Short run of sinus tachycardia at rate of 133 beats per min - looks  like ~28 beats of S.Tach at rate of 133-144 or so before she slows down into NSR.  This was apparently misunderstood after report on phone to be 133 total beats of an SVT rhythm (IE HR 180-200). 1. Rhythm strips have been printed and placed in paper chart 2. BMP and Mg ordered 3. Will leave on tele monitor at least overnight to make sure no other cardiac issues / events occur. 4. Will get an EKG 5. I can see P waves during this so doesn't look like A.Fib run.  Although she does have a history of A.Fib which was just diagnosed 2 weeks ago.  Regardless even if A.Fib she rapidly (within seconds) converted out of it on her own at this time).  Perhaps in and out of Flutter for the 5 beats at 144 rate at the beginning? 6. Spoke with Dr. Vertell Limber and let him know about the findings and review of strip. 2. POD 3 of Craniotomy - management per NS   Thank you for this consultation.  Our [facility] hospitalist team will follow the patient with you.   Time Spent: 30 min  GARDNER, JARED M. D.O. [facility] 10/06/2016, 10:00 PM

## 2016-10-06 NOTE — Progress Notes (Signed)
[facility] Progress Note Patient Name: Deanna Washington DOB: May 16, 1940 MRN: [id number]   Date of Service  10/06/2016  HPI/Events of Note  Episode of SVT and now back in NSR.   eICU Interventions  Will order: 1. BMP and Mg++ level STAT. 2. Suggest Cardiology or Hospitalist consult for further management.      Intervention Category Major Interventions: Arrhythmia - evaluation and management  Keldan Eplin Eugene 10/06/2016, 9:02 PM

## 2016-10-07 DIAGNOSIS — C7931 Secondary malignant neoplasm of brain: Principal | ICD-10-CM

## 2016-10-07 DIAGNOSIS — I471 Supraventricular tachycardia: Secondary | ICD-10-CM

## 2016-10-07 DIAGNOSIS — Z9889 Other specified postprocedural states: Secondary | ICD-10-CM

## 2016-10-07 LAB — BASIC METABOLIC PANEL
ANION GAP: 8 (ref 5–15)
BUN: 13 mg/dL (ref 6–20)
CO2: 24 mmol/L (ref 22–32)
CREATININE: 0.52 mg/dL (ref 0.44–1.00)
Calcium: 8.4 mg/dL — ABNORMAL LOW (ref 8.9–10.3)
Chloride: 102 mmol/L (ref 101–111)
GLUCOSE: 97 mg/dL (ref 65–99)
Potassium: 4.4 mmol/L (ref 3.5–5.1)
Sodium: 134 mmol/L — ABNORMAL LOW (ref 135–145)

## 2016-10-07 LAB — MAGNESIUM: Magnesium: 1.7 mg/dL (ref 1.7–2.4)

## 2016-10-07 MED ORDER — MAGNESIUM SULFATE 2 GM/50ML IV SOLN: 2.0000 g | Freq: Once | INTRAVENOUS | Status: DC

## 2016-10-07 MED ORDER — ONDANSETRON HCL 4 MG PO TABS: 4.0000 mg | ORAL_TABLET | ORAL | 0 refills | Status: AC | PRN

## 2016-10-07 MED ORDER — HYDROCODONE-ACETAMINOPHEN 5-325 MG PO TABS: 1.0000 | ORAL_TABLET | ORAL | 0 refills | Status: AC | PRN

## 2016-10-07 MED ORDER — LEVETIRACETAM 500 MG PO TABS: 500.0000 mg | ORAL_TABLET | Freq: Two times a day (BID) | ORAL | 1 refills | Status: AC

## 2016-10-07 NOTE — Progress Notes (Signed)
Physical Therapy Treatment Patient Details Name: Deanna Washington MRN: [id number] DOB: [birth year] Today's Date: 10/07/2016    History of Present Illness 76yo female with history of breast CA who presented to hospital previously for headaches, nausea/vomiting on 8/22. Work revealed 7 cm right occipital mass 2 smaller subcentimeter masses left cerebellum and left parietal occipital as well as multiple lymph nodes above and below the diaphragm. Patient presented on 9/6 for craniotomy and tumor resection.     PT Comments    Patient is progressing well. Still demonstrates mild instability with higher level balance challenges. Patient is able to maintain balance during these activities with no significant LOB noted. Patient is aware of her current level of function but is eager to continue progressing. She will continue to benefit from skilled HHPT services after discharge for improved functional mobility and return to prior level of independence.   Follow Up Recommendations  Home health PT     Equipment Recommendations  None recommended by PT    Recommendations for Other Services       Precautions / Restrictions Precautions Precautions: Fall Restrictions Weight Bearing Restrictions: No    Mobility  Bed Mobility               General bed mobility comments: Sitting in chair OOB upon arrival.  Transfers Overall transfer level: Needs assistance Equipment used: Rolling walker (2 wheeled) Transfers: Sit to/from Stand Sit to Stand: Min assist         General transfer comment: Pt needed a few attempts before being able to come to stand with min assist from SPT. VCs for proper UE placement for power up to stand.  Ambulation/Gait Ambulation/Gait assistance: Supervision;Min guard Ambulation Distance (Feet): 200 Feet Assistive device: Rolling walker (2 wheeled) Gait Pattern/deviations: Step-through pattern;Trunk flexed;Narrow base of support;Decreased step length - right;Decreased  step length - left Gait velocity: decreased Gait velocity interpretation: Below normal speed for age/gender General Gait Details: Pt amb ~ 264ft with supervision and min guard with high level balance activities for safety. Pt had mild instability with those balance activities but no significant LOB   Stairs            Wheelchair Mobility    Modified Rankin (Stroke Patients Only)       Balance Overall balance assessment: Needs assistance Sitting-balance support: Feet supported Sitting balance-Leahy Scale: Good     Standing balance support: During functional activity;Bilateral upper extremity supported Standing balance-Leahy Scale: Good Standing balance comment: Pt able to maintain balance with high level balance activities. Mild instability noted with stepping over object and head turn to right, otherwise no significant LOB.             High level balance activites: Direction changes;Turns;Sudden stops;Head turns High Level Balance Comments: Pt does not report dizziness with head turns or other activities. Able to maintain balance during challenges. Only mild instability noted with turning head to right and stepping over an object. Otherwise no significant LOB.            Cognition Arousal/Alertness: Awake/alert Behavior During Therapy: WFL for tasks assessed/performed Overall Cognitive Status: Impaired/Different from baseline                                 General Comments: Cognition not formally assessed but needed simple instructions to be repeated a few times for understanding.      Exercises      General Comments General  comments (skin integrity, edema, etc.): Significant edema still present; pt states that is her norm. BLE, RLE > LLE      Pertinent Vitals/Pain Pain Assessment: No/denies pain    Home Living                      Prior Function            PT Goals (current goals can now be found in the care plan section)  Acute Rehab PT Goals Patient Stated Goal: return home; return to prior level of function PT Goal Formulation: With patient Time For Goal Achievement: 10/19/16 Potential to Achieve Goals: Good Progress towards PT goals: Progressing toward goals    Frequency    Min 3X/week      PT Plan Current plan remains appropriate    Co-evaluation              AM-PAC PT "6 Clicks" Daily Activity  Outcome Measure  Difficulty turning over in bed (including adjusting bedclothes, sheets and blankets)?: A Little Difficulty moving from lying on back to sitting on the side of the bed? : A Little Difficulty sitting down on and standing up from a chair with arms (e.g., wheelchair, bedside commode, etc,.)?: A Little Help needed moving to and from a bed to chair (including a wheelchair)?: A Little Help needed walking in hospital room?: None Help needed climbing 3-5 steps with a railing? : A Little 6 Click Score: 19    End of Session Equipment Utilized During Treatment: Gait belt Activity Tolerance: Patient limited by fatigue Patient left: in chair;with call bell/phone within reach;with family/visitor present Nurse Communication: Mobility status PT Visit Diagnosis: Difficulty in walking, not elsewhere classified (R26.2)     Time: 5374-8270 PT Time Calculation (min) (ACUTE ONLY): 11 min  Charges:  $Gait Training: 8-22 mins                    G CodesSindy Guadeloupe, SPT [phone] office    Margarita Grizzle 10/07/2016, 1:27 PM

## 2016-10-07 NOTE — Progress Notes (Signed)
Call from central telemetry,  Patient had 3 beat of non sustained V Tach.  Patient is asleep. Will continue to monitor.

## 2016-10-07 NOTE — Progress Notes (Signed)
Pt states that she will arrange followup appt with Md.

## 2016-10-07 NOTE — Discharge Summary (Signed)
Physician Discharge Summary  Patient ID: Deanna Washington MRN: [id number] DOB/AGE: 74-Jun-1942 74 y.o.  Admit date: 10/03/2016 Discharge date: 10/07/2016  Admission Diagnoses: Right occipital brain metastases    Discharge Diagnoses: same as admitting   Discharged Condition: good  Hospital Course: The patient was admitted on 10/03/2016 and taken to the operating room where the patient underwent craniotomy for right occipital brain metastasis. The patient tolerated the procedure well and was taken to the recovery room and then to the ICU in stable condition. The hospital course was routine. There were no complications. The wound remained clean dry and intact. Pt had appropriate head soreness. No complaints of new pain or new NV. The patient remained afebrile with stable vital signs, and tolerated a regular diet. The patient continued to increase activities, and pain was well controlled with oral pain medications.   Consults: None  Significant Diagnostic Studies:  Results for orders placed or performed during the hospital encounter of 87/86/76  Basic metabolic panel  Result Value Ref Range   Sodium 140 135 - 145 mmol/L   Potassium 3.8 3.5 - 5.1 mmol/L   Chloride 109 101 - 111 mmol/L   CO2 25 22 - 32 mmol/L   Glucose, Bld 138 (H) 65 - 99 mg/dL   BUN 14 6 - 20 mg/dL   Creatinine, Ser 0.63 0.44 - 1.00 mg/dL   Calcium 7.7 (L) 8.9 - 10.3 mg/dL   GFR calc non Af Amer >60 >60 mL/min   GFR calc Af Amer >60 >60 mL/min   Anion gap 6 5 - 15  CBC  Result Value Ref Range   WBC 16.8 (H) 4.0 - 10.5 K/uL   RBC 4.30 3.87 - 5.11 MIL/uL   Hemoglobin 13.8 12.0 - 15.0 g/dL   HCT 40.5 36.0 - 46.0 %   MCV 94.2 78.0 - 100.0 fL   MCH 32.1 26.0 - 34.0 pg   MCHC 34.1 30.0 - 36.0 g/dL   RDW 13.2 11.5 - 15.5 %   Platelets 283 150 - 400 K/uL  Basic metabolic panel  Result Value Ref Range   Sodium 138 135 - 145 mmol/L   Potassium 4.3 3.5 - 5.1 mmol/L   Chloride 106 101 - 111 mmol/L   CO2 26 22 - 32 mmol/L    Glucose, Bld 136 (H) 65 - 99 mg/dL   BUN 12 6 - 20 mg/dL   Creatinine, Ser 0.82 0.44 - 1.00 mg/dL   Calcium 7.9 (L) 8.9 - 10.3 mg/dL   GFR calc non Af Amer >60 >60 mL/min   GFR calc Af Amer >60 >60 mL/min   Anion gap 6 5 - 15  Basic metabolic panel  Result Value Ref Range   Sodium 134 (L) 135 - 145 mmol/L   Potassium 3.7 3.5 - 5.1 mmol/L   Chloride 102 101 - 111 mmol/L   CO2 24 22 - 32 mmol/L   Glucose, Bld 129 (H) 65 - 99 mg/dL   BUN 17 6 - 20 mg/dL   Creatinine, Ser 0.63 0.44 - 1.00 mg/dL   Calcium 8.3 (L) 8.9 - 10.3 mg/dL   GFR calc non Af Amer >60 >60 mL/min   GFR calc Af Amer >60 >60 mL/min   Anion gap 8 5 - 15  Magnesium  Result Value Ref Range   Magnesium 1.8 1.7 - 2.4 mg/dL  I-STAT 7, (LYTES, BLD GAS, ICA, H+H)  Result Value Ref Range   pH, Arterial 7.409 7.350 - 7.450   pCO2 arterial 39.6  32.0 - 48.0 mmHg   pO2, Arterial 223.0 (H) 83.0 - 108.0 mmHg   Bicarbonate 25.0 20.0 - 28.0 mmol/L   TCO2 26 22 - 32 mmol/L   O2 Saturation 100.0 %   Sodium 134 (L) 135 - 145 mmol/L   Potassium 3.5 3.5 - 5.1 mmol/L   Calcium, Ion 1.09 (L) 1.15 - 1.40 mmol/L   HCT 35.0 (L) 36.0 - 46.0 %   Hemoglobin 11.9 (L) 12.0 - 15.0 g/dL   Patient temperature HIDE    Sample type ARTERIAL     Dg Chest 2 View  Result Date: 09/18/2016 CLINICAL DATA:  Generalized body aches, fever, chills, nausea and vomiting for 3 days. EXAM: CHEST  2 VIEW COMPARISON:  None. FINDINGS: There is cardiomegaly and vascular congestion. No consolidative process or pneumothorax. Trace bilateral pleural effusions noted. No acute bony abnormality. IMPRESSION: Cardiomegaly and pulmonary vascular congestion. Trace bilateral pleural effusions. Electronically Signed   By: Inge Rise M.D.   On: 09/18/2016 19:53   Ct Head Wo Contrast  Result Date: 09/18/2016 CLINICAL DATA:  Generalized body aches, fever, chills, nausea and vomiting for 3 days. RIGHT eye and ear pain. EXAM: CT HEAD WITHOUT CONTRAST TECHNIQUE: Contiguous  axial images were obtained from the base of the skull through the vertex without intravenous contrast. COMPARISON:  None. FINDINGS: BRAIN: 4.3 x 7.6 cm rounded hypodensity with heterogeneous component RIGHT prop occipital lobe effacing the RIGHT atrium. Surrounding apparent vasogenic edema. Local mass-effect, 3 mm RIGHT to LEFT midline shift. No intraparenchymal hemorrhage. No hydrocephalus. No abnormal extra-axial fluid collections. VASCULAR: Moderate calcific atherosclerosis of the carotid siphons. SKULL: No skull fracture. No significant scalp soft tissue swelling. SINUSES/ORBITS: Trace paranasal sinus mucosal thickening. Mastoid air cells are well aerated. The included ocular globes and orbital contents are non-suspicious. Status post bilateral ocular lens implants. OTHER: None. IMPRESSION: 1. Large RIGHT parieto-occipital masslike hypodensity, differential diagnosis includes tumor/metastasis, cerebritis or less likely subacute MCA/ posterior watershed territory infarct. 3 mm RIGHT to LEFT midline shift. Recommend MRI of the brain with and without contrast. Acute findings discussed with and reconfirmed by [facility] NANAVATI on 09/18/2016 at 10:20 pm. Electronically Signed   By: Elon Alas M.D.   On: 09/18/2016 22:23   Ct Chest W Contrast  Result Date: 09/19/2016 CLINICAL DATA:  Remote history of breast cancer with recent brain MRI demonstrating metastatic brain lesions. Restaging CT scans. EXAM: CT CHEST, ABDOMEN, AND PELVIS WITH CONTRAST TECHNIQUE: Multidetector CT imaging of the chest, abdomen and pelvis was performed following the standard protocol during bolus administration of intravenous contrast. CONTRAST:  139mL ISOVUE-300 IOPAMIDOL (ISOVUE-300) INJECTION 61% COMPARISON:  None. FINDINGS: CT CHEST FINDINGS Cardiovascular: The heart is normal in size. No pericardial effusion. Mild tortuosity and calcification of the thoracic aorta but no aneurysm or dissection. The branch vessels are patent.  Scattered coronary artery calcifications are noted. Mediastinum/Nodes: Necrotic appearing mediastinal lymphadenopathy. 3.3 x 2.1 cm nodal mass in the aorticopulmonary window on image number 25. There is also cystic/necrotic subcarinal lymphadenopathy with maximum short axis diameter of 25 mm on image number 33. Other scattered smaller lymph nodes are noted including a 9 mm right infrahilar lymph node on image number 38. The esophagus is grossly normal. Lungs/Pleura: Diffuse pulmonary metastatic disease with numerous bilateral pulmonary nodules. Index lesion in the right middle lobe on image number 115 measures 12 x 11 mm. Left upper lobe nodule on image number 85 measures 7 x 5 mm. Right lower lobe lesion just above the right hemidiaphragm on  image number 124 measures 14.5 x 12.5 mm. Large necrotic lesion in the as ago esophageal recess could be a necrotic metastatic lung lesion or a necrotic lymph node. It measures 4.2 by 3.0 cm. Small bilateral pleural effusions and bibasilar atelectasis. Chest wall/ Musculoskeletal: Surgical clips surrounding a cystic area in the right breast, likely small liquified hematoma related to a prior lumpectomy. No obvious breast masses. No axillary lymphadenopathy. No supraclavicular lymphadenopathy. The thyroid gland appears normal. No definite metastatic bone disease. There is a small sclerotic lesion noted in the T1 vertebral body and also in the T3 vertebral body. I do not however see any other definite lesions to suggest metastatic disease. CT ABDOMEN PELVIS FINDINGS Hepatobiliary: No definite findings for hepatic metastatic disease. Layering high attenuation material in the gallbladder could be sludge or stones. No findings for acute cholecystitis. Pancreas: No mass, inflammation or ductal dilatation. Spleen: Normal size.  No focal lesions. Adrenals/Urinary Tract: The adrenal glands are unremarkable. Somewhat irregular appearing left upper pole cystic lesion measures less than 20  Hounsfield units and is most likely a cyst. No worrisome renal lesions or hydronephrosis. Stomach/Bowel: The stomach, duodenum, small bowel and colon are grossly normal. No acute inflammatory process, mass lesions or obstructive findings. Colonic diverticulosis without findings for acute diverticulitis. Vascular/Lymphatic: Moderate atherosclerotic calcifications involving a tortuous abdominal aorta but no focal aneurysm or dissection. The branch vessels are patent. The major venous structures are patent. 11.5 mm gastrohepatic ligament lymph node is noted on image number 60. There is also a low-attenuation retrocrural lymph node measures 11 mm on image number 59. No mesenteric or retroperitoneal mass or lymphadenopathy. Reproductive: The enlarged fibroid uterus. The ovaries appear normal. Other: No pelvic lymphadenopathy. There are enlarged right inguinal lymph nodes. Suspect postoperative changes in the right inguinal area. Musculoskeletal: No obvious metastatic bone disease. IMPRESSION: 1. Pulmonary metastatic disease and fairly extensive necrotic appearing mediastinal and hilar lymphadenopathy. 2. No obvious breast masses and no supraclavicular or axillary adenopathy. 3. No obvious abdominal/pelvic metastatic disease or osseous metastatic disease. A few small upper thoracic sclerotic bone lesions require observation. 4. Enlarged fibroid uterus. 5. Enlarged right inguinal lymph nodes and possible prior postoperative changes. Electronically Signed   By: Marijo Sanes M.D.   On: 09/19/2016 14:45   Mr Jeri Cos AJ Contrast  Result Date: 10/04/2016 CLINICAL DATA:  76 year old female postoperative day 1 stereotactic subtotal resection of a large right occipital brain tumor. Preliminary pathology suggesting breast cancer metastasis. Intraoperative evidence of tumor extension to the trigone of the lateral ventricle. EXAM: MRI HEAD WITHOUT AND WITH CONTRAST TECHNIQUE: Multiplanar, multiecho pulse sequences of the brain  and surrounding structures were obtained without and with intravenous contrast. CONTRAST:  52mL MULTIHANCE GADOBENATE DIMEGLUMINE 529 MG/ML IV SOLN COMPARISON:  Preoperative brain MRI 09/28/2016 and earlier. FINDINGS: Brain: Right posterior paramidline craniotomy site with underlying small extra-axial air in fluid collection up to 11 mm in thickness. Subjacent resection cavity with a combination of fluid and gas. The resection cavity extends to the atrium of the right lateral ventricle just posterior to the choroid plexus. There is pneumoventricle, with gas in both frontal horns, but no ventriculomegaly. Small volume pneumocephalus along the right frontal convexity. A small peninsula of residual brain parenchyma along the superior aspect of the resection cavity demonstrates restricted diffusion (series 3, image 28). There otherwise several small foci of restricted along the right splenium of the corpus callosum diffusion. Postcontrast enhancement at the resection cavity is nodular and curvilinear along the anterior margin  and junction with the atrium of the right lateral ventricle, best demonstrated on series 11, image 27, series 12, image 8 and series 13, image 8. Mild surrounding T2 and FLAIR hyperintensity with largely resolved mass effect on the adjacent ventricle. Stable 10 mm enhancing left cerebellar metastasis on series 11, image 9. Continued confluent abnormal enhancement in both internal auditory canals, more so the left (series 11, images 12 and 13). Stable to slightly decreased punctate and curvilinear foci of enhancement along the left parietal lobe which are favored to be post ischemic rather than metastatic. No other enhancement suspicious for metastatic disease. Scattered nonspecific cerebral white matter T2 and FLAIR hyperintensity appears stable. No restricted diffusion to suggest acute infarction. No midline shift. Basilar cisterns remain patent. Cervicomedullary junction and pituitary are within  normal limits. Vascular: Major intracranial vascular flow voids are stable aside from some extrinsic mass effect on the superior sagittal sinus near the craniectomy site. Skull and upper cervical spine: Negative visualized cervical spine. Stable heterogeneous calvarium bone marrow signal. Bone marrow signal at the skullbase and in the visible cervical spine remains normal. Sinuses/Orbits: Stable and negative orbits soft tissues. Mild paranasal sinus mucosal thickening is stable. Stable trace left mastoid effusion. Other: Postoperative changes to the superior and posterior scalp soft tissues including small scalp hematoma. IMPRESSION: 1. Significant debulking of the large right occipital lobe tumor. Small volume of residual suspicious enhancement along the junction of the anterior resection cavity and atrium of the right lateral ventricle. Several small areas of postoperative restricted diffusion as seen on series 3. 2. Stable appearance of presumed metastatic disease in the left cerebellum and both internal auditory canals. No new leptomeningeal disease or brain metastasis identified. 3. Multiple punctate and small linear foci of cortical enhancement in the left parietal and posterior frontal lobes are still favored to be post ischemic in nature, but will require MRI surveillance. Electronically Signed   By: Genevie Ann M.D.   On: 10/04/2016 12:47   Mr Jeri Cos YQ Contrast  Result Date: 09/28/2016 CLINICAL DATA:  76 year old female with metastatic disease to the brain and in the chest. Subsequent encounter. EXAM: MRI HEAD WITHOUT AND WITH CONTRAST TECHNIQUE: Multiplanar, multiecho pulse sequences of the brain and surrounding structures were obtained without and with intravenous contrast. CONTRAST:  58mL MULTIHANCE GADOBENATE DIMEGLUMINE 529 MG/ML IV SOLN COMPARISON:  Brain MRI 09/19/2016. FINDINGS: Brain: Large heterogeneously enhancing right occipital lobe mass with extension to the atrium of the right lateral  ventricle today encompasses 70 x 48 by 69 mm (AP by transverse by CC) versus 72 x 50 x 70 mm previously at a comparable level. Note that there is stable conspicuous increased enhancement at the ependyma of the posterior right lateral ventricle (series 11, image 96). Regional mass effect including on the posterior right lateral ventricle appears stable. 10 mm central metastasis in the left cerebellar hemisphere is stable in size. No regional mass effect. There are multiple small cortically based areas of enhancement in the left occipital pole which appear to correspond to the small diffusion restricted areas thought to be ischemia on 09/19/2016 (e.g. Series 11, image 53). Of note, Of note, there are also multiple similar small cortically based areas of enhancement in the left parietal lobe, and these are in proximity to the small round cortically based enhancement which is stable on series 11, image 107 and which was thought to be metastatic on 09/19/2016. Evidence of new abnormal internal auditory canal enhancement left greater than right (series 11, images  43 and 44). This is also visible on the coronal and sagittal post-contrast images. No no other intracranial metastatic disease identified. No leptomeningeal enhancement identified elsewhere. There is a new small focus of restricted diffusion in the left superolateral frontal lobe on series 5, image 44) without enhancement). There is also an increased focus of restricted diffusion in the central splenium of the corpus callosum on series 5, image 32 (no enhancement). No other ischemic related restricted diffusion. Stable intracranial mass effect and scattered nonenhancing nonspecific bilateral cerebral white matter T2 and FLAIR hyperintense foci. No acute intracranial hemorrhage identified. Negative pituitary and cervicomedullary junction. Vascular: Major intracranial vascular flow voids are stable. Skull and upper cervical spine: Indeterminate 15 mm enhancing  right posterior calvarium lesion appears stable on series 11, image 100. There are additional smaller right calvarial enhancing lesions (such as on series 11, image 117. Negative visualized cervical spine and spinal cord. Sinuses/Orbits: Stable and negative. Other: Mastoids remain clear.  Negative scalp soft tissues. IMPRESSION: 1. Large 7 cm right occipital lobe enhancing mass with associated mass effect is stable since 09/19/2016. Note associated posterior right lateral ventricle ependymal enhancement suspicious for extension along the ventricle. 2. New abnormal bilateral Internal Auditory Canal enhancement highly suspicious for leptomeningeal metastatic disease, although no other leptomeningeal enhancement is identified. 3. 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 4. Multiple small cortically based areas of enhancement along the posterior left hemisphere appear to be post ischemic. This raises the possibility that the punctate enhancement (presumed third brain metastasis on 09/20/2015) in the left parietal lobe is instead post-ischemic. 5. New small acute lacunar infarct in the posterior left frontal lobe (series 5, image 44). Electronically Signed   By: Genevie Ann M.D.   On: 09/28/2016 18:14   Mr Jeri Cos YI Contrast  Result Date: 09/19/2016 CLINICAL DATA:  Abnormal head CT. Headaches. History breast cancer. EXAM: MRI HEAD WITHOUT AND WITH CONTRAST TECHNIQUE: Multiplanar, multiecho pulse sequences of the brain and surrounding structures were obtained without and with intravenous contrast. CONTRAST:  31mL MULTIHANCE GADOBENATE DIMEGLUMINE 529 MG/ML IV SOLN COMPARISON:  Head CT from yesterday FINDINGS: Brain: Large heterogeneously enhancing mass in the posterior right cerebrum, spanning the parietal and occipital lobes and measuring up to 6 x 5 x 6.4 cm. This mass contains scattered cystic spaces and probable blood products. The overlying cortex is not visible and there is a thin line of overlying dural  thickening. There is local mass effect, mild for the size of tumor. Mild vasogenic edema. 10 mm left cerebellar and 3 mm left parietal cortex enhancing masses, overall pattern suggesting metastatic disease. Punctate areas of cortical diffusion hyperintensity in the left occipital pole consistent with small infarcts. Mild restricted diffusion seen in the right cerebellum, suggesting subacute ischemia. Diffusion within the right-sided mass from blood products or other paramagnetic substance. There is scattered FLAIR hyperintensities in the cerebral white matter consistent with chronic small vessel ischemia. Vascular: Major flow voids are preserved. Skull and upper cervical spine: No aggressive marrow lesion. Right parietal bone lesion has intrinsic T1 hyperintensity most consistent with hemangioma. Sinuses/Orbits: Bilateral cataract resection. Mild chronic sinusitis. IMPRESSION: 1. Metastatic pattern with 3 brain masses. The largest is in the posterior right cerebrum measuring up to 6.4 cm; local mass effect. The other lesions measure 1 cm in the left cerebellum and 3 mm in the left parietal cortex. 2. 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 Subacute ischemia in the upper right cerebellum. Electronically Signed   By:  Monte Fantasia M.D.   On: 09/19/2016 07:49   Ct Abdomen Pelvis W Contrast  Result Date: 09/19/2016 CLINICAL DATA:  Remote history of breast cancer with recent brain MRI demonstrating metastatic brain lesions. Restaging CT scans. EXAM: CT CHEST, ABDOMEN, AND PELVIS WITH CONTRAST TECHNIQUE: Multidetector CT imaging of the chest, abdomen and pelvis was performed following the standard protocol during bolus administration of intravenous contrast. CONTRAST:  167mL ISOVUE-300 IOPAMIDOL (ISOVUE-300) INJECTION 61% COMPARISON:  None. FINDINGS: CT CHEST FINDINGS Cardiovascular: The heart is normal in size. No pericardial effusion. Mild tortuosity and calcification of the thoracic aorta but  no aneurysm or dissection. The branch vessels are patent. Scattered coronary artery calcifications are noted. Mediastinum/Nodes: Necrotic appearing mediastinal lymphadenopathy. 3.3 x 2.1 cm nodal mass in the aorticopulmonary window on image number 25. There is also cystic/necrotic subcarinal lymphadenopathy with maximum short axis diameter of 25 mm on image number 33. Other scattered smaller lymph nodes are noted including a 9 mm right infrahilar lymph node on image number 38. The esophagus is grossly normal. Lungs/Pleura: Diffuse pulmonary metastatic disease with numerous bilateral pulmonary nodules. Index lesion in the right middle lobe on image number 115 measures 12 x 11 mm. Left upper lobe nodule on image number 85 measures 7 x 5 mm. Right lower lobe lesion just above the right hemidiaphragm on image number 124 measures 14.5 x 12.5 mm. Large necrotic lesion in the as ago esophageal recess could be a necrotic metastatic lung lesion or a necrotic lymph node. It measures 4.2 by 3.0 cm. Small bilateral pleural effusions and bibasilar atelectasis. Chest wall/ Musculoskeletal: Surgical clips surrounding a cystic area in the right breast, likely small liquified hematoma related to a prior lumpectomy. No obvious breast masses. No axillary lymphadenopathy. No supraclavicular lymphadenopathy. The thyroid gland appears normal. No definite metastatic bone disease. There is a small sclerotic lesion noted in the T1 vertebral body and also in the T3 vertebral body. I do not however see any other definite lesions to suggest metastatic disease. CT ABDOMEN PELVIS FINDINGS Hepatobiliary: No definite findings for hepatic metastatic disease. Layering high attenuation material in the gallbladder could be sludge or stones. No findings for acute cholecystitis. Pancreas: No mass, inflammation or ductal dilatation. Spleen: Normal size.  No focal lesions. Adrenals/Urinary Tract: The adrenal glands are unremarkable. Somewhat irregular  appearing left upper pole cystic lesion measures less than 20 Hounsfield units and is most likely a cyst. No worrisome renal lesions or hydronephrosis. Stomach/Bowel: The stomach, duodenum, small bowel and colon are grossly normal. No acute inflammatory process, mass lesions or obstructive findings. Colonic diverticulosis without findings for acute diverticulitis. Vascular/Lymphatic: Moderate atherosclerotic calcifications involving a tortuous abdominal aorta but no focal aneurysm or dissection. The branch vessels are patent. The major venous structures are patent. 11.5 mm gastrohepatic ligament lymph node is noted on image number 60. There is also a low-attenuation retrocrural lymph node measures 11 mm on image number 59. No mesenteric or retroperitoneal mass or lymphadenopathy. Reproductive: The enlarged fibroid uterus. The ovaries appear normal. Other: No pelvic lymphadenopathy. There are enlarged right inguinal lymph nodes. Suspect postoperative changes in the right inguinal area. Musculoskeletal: No obvious metastatic bone disease. IMPRESSION: 1. Pulmonary metastatic disease and fairly extensive necrotic appearing mediastinal and hilar lymphadenopathy. 2. No obvious breast masses and no supraclavicular or axillary adenopathy. 3. No obvious abdominal/pelvic metastatic disease or osseous metastatic disease. A few small upper thoracic sclerotic bone lesions require observation. 4. Enlarged fibroid uterus. 5. Enlarged right inguinal lymph nodes and possible  prior postoperative changes. Electronically Signed   By: Marijo Sanes M.D.   On: 09/19/2016 14:45   US Biopsy  Result Date: 09/20/2016 INDICATION: METASTATIC DISEASE, BRAIN METASTASES, INGUINAL ADENOPATHY, UNKNOWN PRIMARY EXAM: ULTRASOUND RIGHT INGUINAL ADENOPATHY 18 GAUGE CORE BIOPSY MEDICATIONS: 1% lidocaine local ANESTHESIA/SEDATION: Moderate (conscious) sedation was employed during this procedure. A total of Versed 2.0 mg and Fentanyl 100 mcg was  administered intravenously. Moderate Sedation Time: 10 Minutes. The patient's level of consciousness and vital signs were monitored continuously by radiology nursing throughout the procedure under my direct supervision. FLUOROSCOPY TIME:  Fluoroscopy Time: None. COMPLICATIONS: None immediate. PROCEDURE: Informed written consent was obtained from the patient after a thorough discussion of the procedural risks, benefits and alternatives. All questions were addressed. Maximal Sterile Barrier Technique was utilized including caps, mask, sterile gowns, sterile gloves, sterile drape, hand hygiene and skin antiseptic. A timeout was performed prior to the initiation of the procedure. Previous imaging reviewed. Preliminary ultrasound performed. Right inguinal abnormal adenopathy localized. Overlying skin marked. Under sterile conditions and local anesthesia, an 18 gauge core biopsy medial was advanced to the right inguinal abnormal adenopathy. 5 18 gauge core biopsies obtained. Samples placed in saline. Needle removed. Postprocedure imaging demonstrates no hemorrhage or hematoma. Patient tolerated the biopsy well. No Complication. IMPRESSION: Successful ultrasound right inguinal adenopathy 18 gauge core biopsies Electronically Signed   By: Jerilynn Mages.  Shick M.D.   On: 09/20/2016 12:50    Antibiotics:  Anti-infectives    Start     Dose/Rate Route Frequency Ordered Stop   10/03/16 2030  ceFAZolin (ANCEF) IVPB 2g/100 mL premix     2 g 200 mL/hr over 30 Minutes Intravenous Every 8 hours 10/03/16 2019 10/04/16 0500   10/03/16 1504  bacitracin 50,000 Units in sodium chloride irrigation 0.9 % 500 mL irrigation  Status:  Discontinued       As needed 10/03/16 1505 10/03/16 1825   10/03/16 1200  ceFAZolin (ANCEF) IVPB 2g/100 mL premix     2 g 200 mL/hr over 30 Minutes Intravenous On call to O.R. 10/03/16 1139 10/03/16 1500      Discharge Exam: Blood pressure (!) 154/81, pulse 60, temperature 97.9 F (36.6 C), temperature  source Oral, resp. rate 20, height 5\' 8"  (1.727 m), weight 77.6 kg (171 lb), SpO2 97 %. Neurologic: Grossly normal Ambulating and voiding well. Appropriate headache at times with occasional dizziness. No N or V.   Discharge Medications:   Allergies as of 10/07/2016   No Known Allergies     Medication List    TAKE these medications   ADVAIR DISKUS 500-50 MCG/DOSE Aepb Generic drug:  Fluticasone-Salmeterol Place 1 puff into alternate nostrils 2 (two) times daily.   amiodarone 200 MG tablet Commonly known as:  PACERONE Take 100 mg by mouth 2 (two) times daily. 1/2 tablet by mouth twice a day   atorvastatin 20 MG tablet Commonly known as:  LIPITOR Take 1 tablet (20 mg total) by mouth daily.   dexamethasone 4 MG tablet Commonly known as:  DECADRON Take 2 tablets (8 mg total) by mouth 2 (two) times daily with a meal.   diphenhydrAMINE 25 mg capsule Commonly known as:  BENADRYL Take 25 mg by mouth at bedtime as needed for sleep.   HYDROcodone-acetaminophen 5-325 MG tablet Commonly known as:  NORCO/VICODIN Take 1 tablet by mouth every 4 (four) hours as needed for moderate pain.   levETIRAcetam 500 MG tablet Commonly known as:  KEPPRA Take 1 tablet (500 mg total) by mouth  2 (two) times daily. What changed:  Another medication with the same name was added. Make sure you understand how and when to take each.   levETIRAcetam 500 MG tablet Commonly known as:  KEPPRA Take 1 tablet (500 mg total) by mouth 2 (two) times daily. What changed:  You were already taking a medication with the same name, and this prescription was added. Make sure you understand how and when to take each.   ondansetron 4 MG tablet Commonly known as:  ZOFRAN Take 1 tablet (4 mg total) by mouth every 4 (four) hours as needed for nausea or vomiting.   pantoprazole 40 MG tablet Commonly known as:  PROTONIX Take 1 tablet (40 mg total) by mouth daily.   PROAIR HFA 108 (90 Base) MCG/ACT inhaler Generic drug:   albuterol Place 1-2 puffs into alternate nostrils every 4 (four) hours as needed for wheezing.            Discharge Care Instructions        Start     Ordered   10/07/16 0000  HYDROcodone-acetaminophen (NORCO/VICODIN) 5-325 MG tablet  Every 4 hours PRN     10/07/16 0757   10/07/16 0000  levETIRAcetam (KEPPRA) 500 MG tablet  2 times daily     10/07/16 0757   10/07/16 0000  ondansetron (ZOFRAN) 4 MG tablet  Every 4 hours PRN     10/07/16 0757   10/07/16 0000  Increase activity slowly     10/07/16 0757   10/07/16 0000  Diet - low sodium heart healthy     10/07/16 0757   10/07/16 0000  Driving Restrictions    Comments:  No driving   60/45/40 9811   10/07/16 0000  Call MD for:  extreme fatigue     10/07/16 0757   10/07/16 0000  Call MD for:  persistant dizziness or light-headedness     10/07/16 0757   10/07/16 0000  Call MD for:  hives     10/07/16 0757   10/07/16 0000  Call MD for:  difficulty breathing, headache or visual disturbances     10/07/16 0757   10/07/16 0000  Call MD for:  redness, tenderness, or signs of infection (pain, swelling, redness, odor or green/yellow discharge around incision site)     10/07/16 0757   10/07/16 0000  Call MD for:  severe uncontrolled pain     10/07/16 0757   10/07/16 0000  Call MD for:  persistant nausea and vomiting     10/07/16 0757   10/07/16 0000  Call MD for:  temperature >100.4     10/07/16 0757      Disposition: home    Final Dx: same as admitting  Discharge Instructions    Call MD for:  difficulty breathing, headache or visual disturbances    Complete by:  As directed    Call MD for:  extreme fatigue    Complete by:  As directed    Call MD for:  hives    Complete by:  As directed    Call MD for:  persistant dizziness or light-headedness    Complete by:  As directed    Call MD for:  persistant nausea and vomiting    Complete by:  As directed    Call MD for:  redness, tenderness, or signs of infection (pain, swelling,  redness, odor or green/yellow discharge around incision site)    Complete by:  As directed    Call MD for:  severe uncontrolled  pain    Complete by:  As directed    Call MD for:  temperature >100.4    Complete by:  As directed    Diet - low sodium heart healthy    Complete by:  As directed    Driving Restrictions    Complete by:  As directed    No driving   Increase activity slowly    Complete by:  As directed          Signed: Ocie Cornfield Landri Dorsainvil 10/07/2016, 7:58 AM

## 2016-10-07 NOTE — Addendum Note (Signed)
Encounter addended by: Doreen Beam, RN on: 10/07/2016  9:48 AM<BR>    Actions taken: Charge Capture section accepted

## 2016-10-07 NOTE — Care Management Note (Signed)
Case Management Note  Patient Details  Name: Deanna Washington MRN: [id number] Date of Birth: 19-May-1940  Subjective/Objective:   Pt is s/p craniotomy. She is from home with her spouse.                  Action/Plan: Pt discharging home with orders for [facility] services. CM met with the patient and her husband is currently active with [facility] and she would also like to use [facility]. Jermaine with [facility] notified and accepted the referral.  Pt's daughter to provide transportation home today.  Expected Discharge Date:  10/07/16               Expected Discharge Plan:  [place]  In-House Referral:     Discharge planning Services  CM Consult  Post Acute Care Choice:  Home Health Choice offered to:  Patient  DME Arranged:    DME Agency:     HH Arranged:  PT, OT HH Agency:  [facility]  Status of Service:  Completed, signed off  If discussed at [place] of Stay Meetings, dates discussed:    Additional Comments:  Pollie Friar, RN 10/07/2016, 11:22 AM

## 2016-10-07 NOTE — Progress Notes (Signed)
Pt discharging at this time taking all personal belongings. IV discontinued, dry dressing applied. Discharge instructions along with prescriptions provided with verbal understanding. Pt will schedule follow up appt per dc summary. No noted distress.

## 2016-10-07 NOTE — Progress Notes (Signed)
Consulted for SVT 133 beats  Upon review of telemetry, patient did not have 133 beats of SVT. She did have some sinus tachycardia with 133 bpm. Patient did have 3 beats of SVT overnight, however remained asymptomatic. Checked patient's potassium, 4.4. Magnesium 1.7, have given her supplementation.   From medicine aspect, patient is stable for discharge. Would repeat BMP and magnesium in 1 week.   Time spent: 15 minutes  Chelesa Weingartner D.O. [facility] Pager [phone]  If 7PM-7AM, please contact night-coverage [email or web address] Password [email or web address] 10/07/2016, 12:39 PM

## 2016-10-07 NOTE — Progress Notes (Signed)
Occupational Therapy Evaluation Patient Details Name: Srinidhi Landers MRN: [id number] DOB: [birth year] Today's Date: 10/07/2016    History of Present Illness 76yo female with history of breast CA who presented to hospital previously for headaches, nausea/vomiting on 8/22. Work revealed 7 cm right occipital mass 2 smaller subcentimeter masses left cerebellum and left parietal occipital as well as multiple lymph nodes above and below the diaphragm. Patient presented on 9/6 for craniotomy and tumor resection.    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 Pt presents with significant functional deficits listed below due to apparent L visual field cut (with apparent impaired central vision), L inattention, balance deficits, in addition to deficits listed below. Recommend initial follow up with HHOT to maximize pt's functional level of independence and safety within the home and then eventually follow up with outpt OT when appropriate. Daughter present for education and very appreciative. Discussed with CM.     Follow Up Recommendations  Home health OT;Supervision/Assistance - 24 hour    Equipment Recommendations  None recommended by OT    Recommendations for Other Services       Precautions / Restrictions Precautions Precautions: Fall Restrictions Weight Bearing Restrictions: No      Mobility Bed Mobility               General bed mobility comments: Sitting in chair upon OT arrival  Transfers   Equipment used: 1 person hand held assist Transfers: Sit to/from Stand Sit to Stand: Supervision Stand pivot transfers: Min guard       General transfer comment: Discussed need to use RW during mobility    Balance Overall balance assessment: Needs assistance   Sitting balance-Leahy Scale: Good       Standing balance-Leahy Scale: poor - requires external support for balance; at risk for falls                              ADL either performed or assessed with clinical judgement   ADL Overall ADL's : Modified independent     Grooming: Min guard;Standing   Upper Body Bathing: Set up;Sitting   Lower Body Bathing: Min guard;Sit to/from stand   Upper Body Dressing : Set up;Sitting   Lower Body Dressing: Min guard;Sit to/from stand   Toilet Transfer: Min Marine scientist Details (indicate cue type and reason): simulated Toileting- Clothing Manipulation and Hygiene: Engineer, site Details (indicate cue type and reason): discussed need to use tub bench for bathing and to not step over tub due to balance deficits   General ADL Comments: Educated pt/daughter on importance of keeping legs elevated when in seated position. Educated on home safety adn reducing risk of falls. Educated on need for assistance with all financial and Engineer, site. Pt/daughter verbalized understanding.      Vision Baseline Vision/History: Wears glasses Wears Glasses: Reading only Patient Visual Report: Blurring of vision Vision Assessment?: Yes Eye Alignment: Within Functional Limits Ocular Range of Motion: Within Functional Limits Alignment/Gaze Preference: Within Defined Limits Tracking/Visual Pursuits: Decreased smoothness of horizontal tracking;Decreased smoothness of vertical tracking Saccades: Additional eye shifts occurred during testing;Additional head turns occurred during testing Convergence: Within functional limits Visual Fields: Left visual field deficit (central vision appears impaired); Pt also appears to have R visual field impaired; inconsistent responses to target presentation; will further assess Depth Perception: Undershoots Additional Comments: Difficulty with reading;  appears to be using contextual cues to help with reading     Perception Perception Perception Tested?: Yes Perception Deficits:  Inattention/neglect Inattention/Neglect: Does not attend to left visual field Spatial deficits: pt extinguishes stimuli on L during double simultaneous stimulation Comments: Educated pt/daughter on L inattention and impact of safety. Educated regarding have someone walk on her L wide when in public. Began education regarding compensatory techqnieus and environmental modifcation to help with functional vision ( marking doorways. increasing lighting, removing clutter) Educated pt/daughter that [facility] would further assess home environemtn adn help make modifications as needed.    Praxis      Pertinent Vitals/Pain Pain Assessment: No/denies pain     Hand Dominance Right   Extremity/Trunk Assessment Upper Extremity Assessment Upper Extremity Assessment: Overall WFL for tasks assessed (mild incoordination deficits noted)   Lower Extremity Assessment Lower Extremity Assessment: Defer to PT evaluation   Cervical / Trunk Assessment Cervical / Trunk Assessment: Normal   Communication Communication Communication: No difficulties   Cognition Arousal/Alertness: Awake/alert Behavior During Therapy: WFL for tasks assessed/performed Overall Cognitive Status: Impaired/Different from baseline Area of Impairment: Attention;Memory;Awareness                   Current Attention Level: Selective       Awareness: Emergent   General Comments: Pt states financial management was very difficult PTA   General Comments       Exercises Exercises: Other exercises Other Exercises Other Exercises: Visaul scanning tasks "poor man's dynavision" - visual scanning tasks using stidky notes on wall (@ 3x3 area) while identifying targets to work on visual scanning and eye hand coordination   Shoulder Instructions      Home Living Family/patient expects to be discharged to:: Private residence Living Arrangements: Spouse/significant other;Children Available Help at Discharge: Family Type of Home:  House Home Access: Ramped entrance     Home Layout: One level     Bathroom Shower/Tub: Corporate investment banker: Standard Bathroom Accessibility: Yes How Accessible: Accessible via walker Home Equipment: Walker - 2 wheels;Tub bench;Bedside commode;Grab bars - tub/shower;Grab bars - toilet      Lives With: Spouse;Son;Daughter    Prior Functioning/Environment Level of Independence: Independent        Comments: Pt was independent with all aspects of self care and mobility. Pt is the primary caregiver for her husband.        OT Problem List: Impaired balance (sitting and/or standing);Impaired vision/perception;Decreased coordination;Decreased cognition;Decreased safety awareness;Decreased knowledge of use of DME or AE      OT Treatment/Interventions: Self-care/ADL training;Therapeutic exercise;DME and/or AE instruction;Therapeutic activities;Cognitive remediation/compensation;Visual/perceptual remediation/compensation;Patient/family education;Balance training    OT Goals(Current goals can be found in the care plan section) Acute Rehab OT Goals Patient Stated Goal: become more independent OT Goal Formulation: All assessment and education complete, DC therapy (all furtehr OT to be addressed by [facility])  OT Frequency:     Barriers to D/C:            Co-evaluation              AM-PAC PT "6 Clicks" Daily Activity     Outcome Measure Help from another person eating meals?: None Help from another person taking care of personal grooming?: None Help from another person toileting, which includes using toliet, bedpan, or urinal?: A Little Help from another person bathing (including washing, rinsing, drying)?: A Little Help from another person to put on and taking off regular upper body clothing?: None Help from another  person to put on and taking off regular lower body clothing?: A Little 6 Click Score: 21   End of Session Nurse Communication: Mobility  status;Other (comment) (DC needs)  Activity Tolerance: Patient tolerated treatment well Patient left: in chair;with call bell/phone within reach;with family/visitor present  OT Visit Diagnosis: Unsteadiness on feet (R26.81);Low vision, both eyes (H54.2);Other symptoms and signs involving cognitive function                Time: 5520-8022 OT Time Calculation (min): 30 min Charges:  OT General Charges $OT Visit: 1 Visit OT Evaluation $OT Eval Moderate Complexity: 1 Mod OT Treatments $Self Care/Home Management : 8-22 mins G-Codes:     [facility], OT/L  [phone] 10/07/2016  Aireonna Bauer,HILLARY 10/07/2016, 10:18 AM

## 2016-10-10 ENCOUNTER — Telehealth: Payer: Self-pay | Admitting: Cardiovascular Disease

## 2016-10-10 NOTE — Telephone Encounter (Signed)
OK thank you 

## 2016-10-10 NOTE — Telephone Encounter (Signed)
As long as her heart rate at rest is < 100 bpm. Continue amiodarone for the time being.

## 2016-10-10 NOTE — Telephone Encounter (Signed)
S/w Debbie Crampt(daughter-POA), she states that pt is bradycardic all the time her HR runs 60BPM and they discussed this with Dr Oval Linsey in the hospital and she is not concerned at this time. She will plan on keeping current TOC appt

## 2016-10-10 NOTE — Telephone Encounter (Signed)
S/w Debbie Crampt(daughter-POA), she states that pt was d/c from [place] Monday or Tuesday. 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 She had to cancel pt's TOC appt next week because she has so many appts for the CA. Pt is doing home health and physical therapy also. She will have home health call us with any updates. She is not currently have any sx, AFIB self converted while in the hospital. Scheduled TOC appt to 10-25-16 @ 8am. She will call with any changes.

## 2016-10-10 NOTE — Telephone Encounter (Signed)
New message    Pt daughter is calling to find out when pt needs to follow up with Dr. Oval Linsey. She had an appt scheduled for Monday but daughter canceled, pt was just discharged from hospital due to brain cancer. She said she thinks Dr. Oval Linsey saw pt in hospital. She said they can't make it next week but would like to know when to follow up. Please call.

## 2016-10-11 ENCOUNTER — Other Ambulatory Visit: Payer: Self-pay | Admitting: Oncology

## 2016-10-14 ENCOUNTER — Ambulatory Visit: Payer: Medicare Other | Admitting: Cardiovascular Disease

## 2016-10-14 NOTE — Progress Notes (Addendum)
Location/Histology of Brain Tumor: Right occipital  Mass and 2 smaller satellite lesions in left hemispheremets from breast cancer  Patient presented with symptoms of:  Head aches and nausea ,vomiting,  Past or anticipated interventions, if any, per neurosurgery: Diagnosis 10/03/2016: Dr. Kary Kos  MD Stereotactic craniotomy utilizing the BrainLab curve navigation system for resection of right occipital brain metastasis   1. Brain, for tumor resection, Right Occipital - METASTATIC CARCINOMA. - SEE COMMENT. 2. Brain, biopsy, Right occipital - METASTATIC CARCINOMA.  Past or anticipated interventions, if any, per medical oncology: Renetta Chalk NP  today  Dose of Decadron, if applicable: 8mg  oral 2x day , looks like thrush  Recent neurologic symptoms, if any:   Seizures: started on Keppra  Headaches: mild   Nausea: No  Dizziness/ataxia: yes dizzyness  Difficulty with hand coordination:   Focal numbness/weakness: weakness  Visual deficits/changes: NO  Confusion/Memory deficits: NO No Painful bone metastases at present, if any: NO  SAFETY ISSUES: yes ,weakness fatigue  Prior radiation? Yes 2010 right breast 7 weeks in [place]  Pacemaker/ICD? NO  Is the patient on methotrexate? NO  Additional Complaints / other details: Hx breast cancer,lumpectomy,  believes in 2010,quit smoking ,rare alcohol use,no drug use or smokeless tobacco, A-fib, SVT new during hospitalization BP 129/78   Pulse 66   Temp 98.2 F (36.8 C) (Oral)   Resp 20   Ht 5\' 8"  (1.727 m)   SpO2 98%   Wt Readings from Last 3 Encounters:  10/03/16 171 lb (77.6 kg)  09/21/16 169 lb 1.5 oz (76.7 kg)  09/19/16 148 lb (67.1 kg)     Allergies:NKA

## 2016-10-15 ENCOUNTER — Other Ambulatory Visit: Payer: Self-pay | Admitting: Adult Health

## 2016-10-15 DIAGNOSIS — C799 Secondary malignant neoplasm of unspecified site: Secondary | ICD-10-CM

## 2016-10-16 ENCOUNTER — Other Ambulatory Visit: Payer: Medicare Other

## 2016-10-16 ENCOUNTER — Telehealth: Payer: Self-pay | Admitting: *Deleted

## 2016-10-16 ENCOUNTER — Telehealth: Payer: Self-pay

## 2016-10-16 ENCOUNTER — Encounter: Payer: Self-pay | Admitting: Adult Health

## 2016-10-16 ENCOUNTER — Other Ambulatory Visit (HOSPITAL_BASED_OUTPATIENT_CLINIC_OR_DEPARTMENT_OTHER): Payer: Medicare Other

## 2016-10-16 ENCOUNTER — Ambulatory Visit: Payer: Medicare Other | Admitting: Adult Health

## 2016-10-16 ENCOUNTER — Ambulatory Visit (HOSPITAL_BASED_OUTPATIENT_CLINIC_OR_DEPARTMENT_OTHER): Payer: Medicare Other | Admitting: Adult Health

## 2016-10-16 ENCOUNTER — Ambulatory Visit
Admission: RE | Admit: 2016-10-16 | Discharge: 2016-10-16 | Disposition: A | Discharge status: Home / Self Care | Payer: Medicare Other | Source: Ambulatory Visit | Attending: Radiation Oncology | Admitting: Radiation Oncology

## 2016-10-16 ENCOUNTER — Encounter: Payer: Self-pay | Admitting: Radiation Oncology

## 2016-10-16 VITALS — BP 129/78 | HR 66 | Temp 98.2°F | Resp 20 | Ht 68.0 in

## 2016-10-16 DIAGNOSIS — C799 Secondary malignant neoplasm of unspecified site: Secondary | ICD-10-CM

## 2016-10-16 DIAGNOSIS — Z923 Personal history of irradiation: Secondary | ICD-10-CM | POA: Diagnosis not present

## 2016-10-16 DIAGNOSIS — Z79899 Other long term (current) drug therapy: Secondary | ICD-10-CM | POA: Diagnosis not present

## 2016-10-16 DIAGNOSIS — C7931 Secondary malignant neoplasm of brain: Secondary | ICD-10-CM

## 2016-10-16 DIAGNOSIS — Z853 Personal history of malignant neoplasm of breast: Secondary | ICD-10-CM | POA: Diagnosis not present

## 2016-10-16 DIAGNOSIS — C7800 Secondary malignant neoplasm of unspecified lung: Secondary | ICD-10-CM | POA: Diagnosis not present

## 2016-10-16 DIAGNOSIS — I10 Essential (primary) hypertension: Secondary | ICD-10-CM | POA: Diagnosis not present

## 2016-10-16 DIAGNOSIS — C7949 Secondary malignant neoplasm of other parts of nervous system: Principal | ICD-10-CM

## 2016-10-16 DIAGNOSIS — F40240 Claustrophobia: Secondary | ICD-10-CM | POA: Diagnosis not present

## 2016-10-16 DIAGNOSIS — J45909 Unspecified asthma, uncomplicated: Secondary | ICD-10-CM | POA: Diagnosis not present

## 2016-10-16 DIAGNOSIS — Z7951 Long term (current) use of inhaled steroids: Secondary | ICD-10-CM | POA: Diagnosis not present

## 2016-10-16 DIAGNOSIS — B370 Candidal stomatitis: Secondary | ICD-10-CM | POA: Diagnosis not present

## 2016-10-16 LAB — CBC WITH DIFFERENTIAL/PLATELET
BASO%: 0.1 % (ref 0.0–2.0)
Basophils Absolute: 0 10*3/uL (ref 0.0–0.1)
EOS%: 0.1 % (ref 0.0–7.0)
Eosinophils Absolute: 0 10*3/uL (ref 0.0–0.5)
HCT: 43.2 % (ref 34.8–46.6)
HGB: 14.7 g/dL (ref 11.6–15.9)
LYMPH#: 1.1 10*3/uL (ref 0.9–3.3)
LYMPH%: 6.5 % — AB (ref 14.0–49.7)
MCH: 32 pg (ref 25.1–34.0)
MCHC: 34 g/dL (ref 31.5–36.0)
MCV: 94.1 fL (ref 79.5–101.0)
MONO#: 0.3 10*3/uL (ref 0.1–0.9)
MONO%: 1.8 % (ref 0.0–14.0)
NEUT%: 91.5 % — AB (ref 38.4–76.8)
NEUTROS ABS: 15.4 10*3/uL — AB (ref 1.5–6.5)
Platelets: 240 10*3/uL (ref 145–400)
RBC: 4.59 10*6/uL (ref 3.70–5.45)
RDW: 13 % (ref 11.2–14.5)
WBC: 16.8 10*3/uL — AB (ref 3.9–10.3)

## 2016-10-16 LAB — COMPREHENSIVE METABOLIC PANEL
ALT: 43 U/L (ref 0–55)
AST: 12 U/L (ref 5–34)
Albumin: 2.8 g/dL — ABNORMAL LOW (ref 3.5–5.0)
Alkaline Phosphatase: 104 U/L (ref 40–150)
Anion Gap: 11 mEq/L (ref 3–11)
BUN: 18.8 mg/dL (ref 7.0–26.0)
CHLORIDE: 102 meq/L (ref 98–109)
CO2: 22 meq/L (ref 22–29)
CREATININE: 0.7 mg/dL (ref 0.6–1.1)
Calcium: 8.6 mg/dL (ref 8.4–10.4)
EGFR: 84 mL/min/{1.73_m2} — ABNORMAL LOW (ref >90)
Glucose: 138 mg/dl (ref 70–140)
POTASSIUM: 3.9 meq/L (ref 3.5–5.1)
SODIUM: 135 meq/L — AB (ref 136–145)
Total Bilirubin: 0.5 mg/dL (ref 0.20–1.20)
Total Protein: 5.7 g/dL — ABNORMAL LOW (ref 6.4–8.3)

## 2016-10-16 MED ORDER — NYSTATIN 100000 UNIT/ML MT SUSP: 5.0000 mL | Freq: Four times a day (QID) | OROMUCOSAL | 0 refills | Status: AC

## 2016-10-16 MED ORDER — ADVAIR DISKUS 500-50 MCG/DOSE IN AEPB: 1.0000 | INHALATION_SPRAY | Freq: Two times a day (BID) | RESPIRATORY_TRACT | 6 refills | Status: AC

## 2016-10-16 MED ORDER — LORAZEPAM 0.5 MG PO TABS: ORAL_TABLET | ORAL | 0 refills | Status: AC

## 2016-10-16 MED ORDER — LAPATINIB DITOSYLATE 250 MG PO TABS
750.0000 mg | ORAL_TABLET | Freq: Every day | ORAL | 0 refills | Status: DC
Start: 2016-10-16 — End: 2016-10-18

## 2016-10-16 NOTE — Telephone Encounter (Signed)
Printed avs and calender for upcoming appointments in September and October. Per 9/19 los

## 2016-10-16 NOTE — Progress Notes (Signed)
Please see the Nurse Progress Note in the MD Initial Consult Encounter for this patient. 

## 2016-10-16 NOTE — Telephone Encounter (Signed)
error 

## 2016-10-16 NOTE — Progress Notes (Signed)
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  She says she underwent surgery and radiation with no systemic treatment.  She was admitted recently to the hospital for headahces, nausea/vomiting, and was discovered to have a large right occipital mass as well as two smaller satellite lesions in the left hemisphere systemic workup revealed adenopathy above and below the diaphragm.  She underwent craniotomy on 10/03/2016 that revealed: metastatic breast cancer, HER-2 positive (ratio 2.76).    The patient's subsequent history is as detailed below.  INTERVAL HISTORY: Deanna Washington is here today for follow up after having her craniotomy that revealed HER-2 positive breast cancer.  She is doing well.  She is accompanied today by her daughter [name].  She is in a wheelchair.  She says that she id doing moderately well since discharge from the hospital.  She does have some continued lower extremity edema that she is wearing AE hose for, but she is also tired.    REVIEW OF SYSTEMS:  Hibah continues to work on rebuilding her strength.  She is owrking with PT and OT, and she does need assistance with a lot of the household chores.  She does enjoy folding luandry, and is working on being able to bathe again.  She is independent in toileting.  She does have help when she needs it.  We reviewed her meds today,  and she is doing well with no other concerns.    PAST MEDICAL HISTORY: Past Medical History:  Diagnosis Date  . Asthma   . Breast cancer [facility])    believes it was in 2010  . HTN (hypertension)     PAST SURGICAL HISTORY: Past Surgical History:  Procedure Laterality Date  . BREAST LUMPECTOMY     lumpectomy  . CRANIOTOMY Right 10/03/2016   Procedure: Right Occipital CRANIOTOMY TUMOR EXCISION with Curve;  Surgeon: Kary Kos, MD;  Location: [facility];  Service: Neurosurgery;  Laterality: Right;    FAMILY HISTORY Family History  Problem Relation Age of Onset  . Lung cancer Sister   . Esophageal cancer Brother   . Leukemia Maternal Aunt   . Lung cancer Brother   . Leukemia Brother   . Lung cancer Sister   . Breast cancer Neg Hx   . Colon cancer Neg Hx   . Pancreatic cancer Neg Hx     GYNECOLOGIC HISTORY:  No LMP recorded. Patient is postmenopausal. Menarche at age 76.  Took estrogen replacement therapy, took for three years.  Stopped menses in late 40s.  No h/o abnormal pap smears, post menopausal bleeding.     SOCIAL HISTORY:  Lives at home with husband and son Legrand Como.  Husband has metastatic prostate cancer, just went into home hospice.  Patient was his previous caregiver, son, Legrand Como is now caregiver, and daughter [name] also helps. Other children Butch Penny lives in [place], Verden lives in [place], and Cruger lives in [place].      ADVANCED  DIRECTIVES:  Daughters [name] [phone]) and Butch Penny [phone]) are health care powers of attorney.  No living will or advance directives in place at this time.  Had lengthy discussion with patient and daughter, she does not want resuscitation in the event of cardiac arrest.    HEALTH MAINTENANCE: Social History  Substance Use Topics  . Smoking status: Former Research scientist (life sciences)  . Smokeless tobacco: Never Used     Comment: quit 25 years ago  . Alcohol use 1.2 oz/week    2 Glasses of wine per week     Comment: 2 glasses a day      Colonoscopy:  PAP:  Bone density:   No Known Allergies  Current Outpatient Prescriptions  Medication Sig Dispense Refill  . ADVAIR DISKUS 500-50 MCG/DOSE AEPB Place 1 puff into alternate nostrils 2 (two) times daily.    Marland Kitchen amiodarone (PACERONE) 200 MG tablet Take 100 mg by mouth 2 (two) times daily. 1/2 tablet by mouth twice a day     . dexamethasone (DECADRON) 4 MG tablet Take 2 tablets (8 mg total) by mouth 2 (two) times daily with a meal. 120 tablet 0  . diphenhydrAMINE (BENADRYL) 25 mg capsule Take 25 mg by mouth at bedtime as needed for sleep.     Marland Kitchen HYDROcodone-acetaminophen (NORCO/VICODIN) 5-325 MG tablet Take 1 tablet by mouth every 4 (four) hours as needed for moderate pain. 30 tablet 0  . levETIRAcetam (KEPPRA) 500 MG tablet Take 1 tablet (500 mg total) by mouth 2 (two) times daily. 60 tablet 1  . ondansetron (ZOFRAN) 4 MG tablet Take 1 tablet (4 mg total) by mouth every 4 (four) hours as needed for nausea or vomiting. 20 tablet 0  . pantoprazole (PROTONIX) 40 MG tablet Take 1 tablet (40 mg total) by mouth daily. 30 tablet 0  . PROAIR HFA 108 (90 Base) MCG/ACT inhaler Place 1-2 puffs into alternate nostrils every 4 (four) hours as needed for wheezing.    Marland Kitchen atorvastatin (LIPITOR) 20 MG tablet Take 1 tablet (20 mg total) by mouth daily. (Patient not taking: Reported on 10/16/2016) 30 tablet 0   No current facility-administered medications for this visit.     OBJECTIVE:  Vitals:   10/16/16 0830  BP: 129/78  Pulse: 66  Resp: 20  Temp: 98.2 F (36.8 C)  SpO2: 98%     There is no height or weight on file to calculate BMI.   Wt Readings from Last 3 Encounters:  10/03/16 171 lb (77.6 kg)  09/21/16 169 lb 1.5 oz (76.7 kg)  09/19/16 148 lb (67.1 kg)      ECOG FS:2 - Symptomatic, <50% confined to bed  GENERAL: Patient is a chronically ill appearing older female in no apparent distress HEENT:  Sclerae anicteric. PERRL. Oropharynx clear and moist. No ulcerations or evidence  of oropharyngeal candidiasis. Neck is supple.  NODES:  No cervical, supraclavicular, or axillary lymphadenopathy palpated.  BREAST EXAM:  Deferred. LUNGS:  Clear to auscultation bilaterally.  No wheezes or rhonchi. HEART:  Regular rate and rhythm. No murmur appreciated. ABDOMEN:  Soft, nontender.  Positive, normoactive bowel sounds. No organomegaly palpated. MSK:  No focal spinal tenderness to palpation. Full range of motion bilaterally in the upper extremities. EXTREMITIES:  BLE edema present, AE hose on  SKIN:  Clear with no obvious rashes or skin changes. No nail dyscrasia. NEURO:  Nonfocal. Well oriented.  Appropriate affect.    LAB RESULTS:  CMP     Lab Results  Component Value Date  WBC 16.8 (H) 10/16/2016   NEUTROABS 15.4 (H) 10/16/2016   HGB 14.7 10/16/2016   HCT 43.2 10/16/2016   MCV 94.1 10/16/2016   PLT 240 10/16/2016      Chemistry      Component Value Date/Time   NA 134 (L) 10/07/2016 0849   K 4.4 10/07/2016 0849   CL 102 10/07/2016 0849   CO2 24 10/07/2016 0849   BUN 13 10/07/2016 0849   CREATININE 0.52 10/07/2016 0849      Component Value Date/Time   CALCIUM 8.4 (L) 10/07/2016 0849   ALKPHOS 61 09/19/2016 0319   AST 21 09/19/2016 0319   ALT 18 09/19/2016 0319   BILITOT 0.6 09/19/2016 0319       Lab Results  Component Value Date   CA2729 174.7 (H) 09/20/2016    No components found for: HGQUANT  Lab Results  Component Value Date   CEA1 3.5 09/20/2016        (this displays the last labs from the last 3 days)  Urinalysis    Component Value Date/Time   COLORURINE AMBER (A) 09/18/2016 1659   APPEARANCEUR HAZY (A) 09/18/2016 1659   LABSPEC 1.027 09/18/2016 1659   PHURINE 5.0 09/18/2016 1659   GLUCOSEU NEGATIVE 09/18/2016 1659   HGBUR SMALL (A) 09/18/2016 1659   BILIRUBINUR NEGATIVE 09/18/2016 1659   KETONESUR 80 (A) 09/18/2016 1659   PROTEINUR 30 (A) 09/18/2016 1659   NITRITE NEGATIVE 09/18/2016 1659   LEUKOCYTESUR MODERATE (A)  09/18/2016 1659     STUDIES: Dg Chest 2 View  Result Date: 09/18/2016 CLINICAL DATA:  Generalized body aches, fever, chills, nausea and vomiting for 3 days. EXAM: CHEST  2 VIEW COMPARISON:  None. FINDINGS: There is cardiomegaly and vascular congestion. No consolidative process or pneumothorax. Trace bilateral pleural effusions noted. No acute bony abnormality. IMPRESSION: Cardiomegaly and pulmonary vascular congestion. Trace bilateral pleural effusions. Electronically Signed   By: Inge Rise M.D.   On: 09/18/2016 19:53   Ct Head Wo Contrast  Result Date: 09/18/2016 CLINICAL DATA:  Generalized body aches, fever, chills, nausea and vomiting for 3 days. RIGHT eye and ear pain. EXAM: CT HEAD WITHOUT CONTRAST TECHNIQUE: Contiguous axial images were obtained from the base of the skull through the vertex without intravenous contrast. COMPARISON:  None. FINDINGS: BRAIN: 4.3 x 7.6 cm rounded hypodensity with heterogeneous component RIGHT prop occipital lobe effacing the RIGHT atrium. Surrounding apparent vasogenic edema. Local mass-effect, 3 mm RIGHT to LEFT midline shift. No intraparenchymal hemorrhage. No hydrocephalus. No abnormal extra-axial fluid collections. VASCULAR: Moderate calcific atherosclerosis of the carotid siphons. SKULL: No skull fracture. No significant scalp soft tissue swelling. SINUSES/ORBITS: Trace paranasal sinus mucosal thickening. Mastoid air cells are well aerated. The included ocular globes and orbital contents are non-suspicious. Status post bilateral ocular lens implants. OTHER: None. IMPRESSION: 1. Large RIGHT parieto-occipital masslike hypodensity, differential diagnosis includes tumor/metastasis, cerebritis or less likely subacute MCA/ posterior watershed territory infarct. 3 mm RIGHT to LEFT midline shift. Recommend MRI of the brain with and without contrast. Acute findings discussed with and reconfirmed by [facility] NANAVATI on 09/18/2016 at 10:20 pm. Electronically Signed    By: Elon Alas M.D.   On: 09/18/2016 22:23   Ct Chest W Contrast  Result Date: 09/19/2016 CLINICAL DATA:  Remote history of breast cancer with recent brain MRI demonstrating metastatic brain lesions. Restaging CT scans. EXAM: CT CHEST, ABDOMEN, AND PELVIS WITH CONTRAST TECHNIQUE: Multidetector CT imaging of the chest, abdomen and pelvis was performed following the standard protocol during  bolus administration of intravenous contrast. CONTRAST:  145m ISOVUE-300 IOPAMIDOL (ISOVUE-300) INJECTION 61% COMPARISON:  None. FINDINGS: CT CHEST FINDINGS Cardiovascular: The heart is normal in size. No pericardial effusion. Mild tortuosity and calcification of the thoracic aorta but no aneurysm or dissection. The branch vessels are patent. Scattered coronary artery calcifications are noted. Mediastinum/Nodes: Necrotic appearing mediastinal lymphadenopathy. 3.3 x 2.1 cm nodal mass in the aorticopulmonary window on image number 25. There is also cystic/necrotic subcarinal lymphadenopathy with maximum short axis diameter of 25 mm on image number 33. Other scattered smaller lymph nodes are noted including a 9 mm right infrahilar lymph node on image number 38. The esophagus is grossly normal. Lungs/Pleura: Diffuse pulmonary metastatic disease with numerous bilateral pulmonary nodules. Index lesion in the right middle lobe on image number 115 measures 12 x 11 mm. Left upper lobe nodule on image number 85 measures 7 x 5 mm. Right lower lobe lesion just above the right hemidiaphragm on image number 124 measures 14.5 x 12.5 mm. Large necrotic lesion in the as ago esophageal recess could be a necrotic metastatic lung lesion or a necrotic lymph node. It measures 4.2 by 3.0 cm. Small bilateral pleural effusions and bibasilar atelectasis. Chest wall/ Musculoskeletal: Surgical clips surrounding a cystic area in the right breast, likely small liquified hematoma related to a prior lumpectomy. No obvious breast masses. No axillary  lymphadenopathy. No supraclavicular lymphadenopathy. The thyroid gland appears normal. No definite metastatic bone disease. There is a small sclerotic lesion noted in the T1 vertebral body and also in the T3 vertebral body. I do not however see any other definite lesions to suggest metastatic disease. CT ABDOMEN PELVIS FINDINGS Hepatobiliary: No definite findings for hepatic metastatic disease. Layering high attenuation material in the gallbladder could be sludge or stones. No findings for acute cholecystitis. Pancreas: No mass, inflammation or ductal dilatation. Spleen: Normal size.  No focal lesions. Adrenals/Urinary Tract: The adrenal glands are unremarkable. Somewhat irregular appearing left upper pole cystic lesion measures less than 20 Hounsfield units and is most likely a cyst. No worrisome renal lesions or hydronephrosis. Stomach/Bowel: The stomach, duodenum, small bowel and colon are grossly normal. No acute inflammatory process, mass lesions or obstructive findings. Colonic diverticulosis without findings for acute diverticulitis. Vascular/Lymphatic: Moderate atherosclerotic calcifications involving a tortuous abdominal aorta but no focal aneurysm or dissection. The branch vessels are patent. The major venous structures are patent. 11.5 mm gastrohepatic ligament lymph node is noted on image number 60. There is also a low-attenuation retrocrural lymph node measures 11 mm on image number 59. No mesenteric or retroperitoneal mass or lymphadenopathy. Reproductive: The enlarged fibroid uterus. The ovaries appear normal. Other: No pelvic lymphadenopathy. There are enlarged right inguinal lymph nodes. Suspect postoperative changes in the right inguinal area. Musculoskeletal: No obvious metastatic bone disease. IMPRESSION: 1. Pulmonary metastatic disease and fairly extensive necrotic appearing mediastinal and hilar lymphadenopathy. 2. No obvious breast masses and no supraclavicular or axillary adenopathy. 3. No  obvious abdominal/pelvic metastatic disease or osseous metastatic disease. A few small upper thoracic sclerotic bone lesions require observation. 4. Enlarged fibroid uterus. 5. Enlarged right inguinal lymph nodes and possible prior postoperative changes. Electronically Signed   By: PMarijo SanesM.D.   On: 09/19/2016 14:45   Mr BJeri CosWMHContrast  Result Date: 10/04/2016 CLINICAL DATA:  76year old female postoperative day 1 stereotactic subtotal resection of a large right occipital brain tumor. Preliminary pathology suggesting breast cancer metastasis. Intraoperative evidence of tumor extension to the trigone of the lateral ventricle.  EXAM: MRI HEAD WITHOUT AND WITH CONTRAST TECHNIQUE: Multiplanar, multiecho pulse sequences of the brain and surrounding structures were obtained without and with intravenous contrast. CONTRAST:  79m MULTIHANCE GADOBENATE DIMEGLUMINE 529 MG/ML IV SOLN COMPARISON:  Preoperative brain MRI 09/28/2016 and earlier. FINDINGS: Brain: Right posterior paramidline craniotomy site with underlying small extra-axial air in fluid collection up to 11 mm in thickness. Subjacent resection cavity with a combination of fluid and gas. The resection cavity extends to the atrium of the right lateral ventricle just posterior to the choroid plexus. There is pneumoventricle, with gas in both frontal horns, but no ventriculomegaly. Small volume pneumocephalus along the right frontal convexity. A small peninsula of residual brain parenchyma along the superior aspect of the resection cavity demonstrates restricted diffusion (series 3, image 28). There otherwise several small foci of restricted along the right splenium of the corpus callosum diffusion. Postcontrast enhancement at the resection cavity is nodular and curvilinear along the anterior margin and junction with the atrium of the right lateral ventricle, best demonstrated on series 11, image 27, series 12, image 8 and series 13, image 8. Mild  surrounding T2 and FLAIR hyperintensity with largely resolved mass effect on the adjacent ventricle. Stable 10 mm enhancing left cerebellar metastasis on series 11, image 9. Continued confluent abnormal enhancement in both internal auditory canals, more so the left (series 11, images 12 and 13). Stable to slightly decreased punctate and curvilinear foci of enhancement along the left parietal lobe which are favored to be post ischemic rather than metastatic. No other enhancement suspicious for metastatic disease. Scattered nonspecific cerebral white matter T2 and FLAIR hyperintensity appears stable. No restricted diffusion to suggest acute infarction. No midline shift. Basilar cisterns remain patent. Cervicomedullary junction and pituitary are within normal limits. Vascular: Major intracranial vascular flow voids are stable aside from some extrinsic mass effect on the superior sagittal sinus near the craniectomy site. Skull and upper cervical spine: Negative visualized cervical spine. Stable heterogeneous calvarium bone marrow signal. Bone marrow signal at the skullbase and in the visible cervical spine remains normal. Sinuses/Orbits: Stable and negative orbits soft tissues. Mild paranasal sinus mucosal thickening is stable. Stable trace left mastoid effusion. Other: Postoperative changes to the superior and posterior scalp soft tissues including small scalp hematoma. IMPRESSION: 1. Significant debulking of the large right occipital lobe tumor. Small volume of residual suspicious enhancement along the junction of the anterior resection cavity and atrium of the right lateral ventricle. Several small areas of postoperative restricted diffusion as seen on series 3. 2. Stable appearance of presumed metastatic disease in the left cerebellum and both internal auditory canals. No new leptomeningeal disease or brain metastasis identified. 3. Multiple punctate and small linear foci of cortical enhancement in the left parietal  and posterior frontal lobes are still favored to be post ischemic in nature, but will require MRI surveillance. Electronically Signed   By: HGenevie AnnM.D.   On: 10/04/2016 12:47   Mr BJeri CosWGGContrast  Result Date: 09/28/2016 CLINICAL DATA:  76year old female with metastatic disease to the brain and in the chest. Subsequent encounter. EXAM: MRI HEAD WITHOUT AND WITH CONTRAST TECHNIQUE: Multiplanar, multiecho pulse sequences of the brain and surrounding structures were obtained without and with intravenous contrast. CONTRAST:  140mMULTIHANCE GADOBENATE DIMEGLUMINE 529 MG/ML IV SOLN COMPARISON:  Brain MRI 09/19/2016. FINDINGS: Brain: Large heterogeneously enhancing right occipital lobe mass with extension to the atrium of the right lateral ventricle today encompasses 70 x 48 by 69 mm (AP by  transverse by CC) versus 72 x 50 x 70 mm previously at a comparable level. Note that there is stable conspicuous increased enhancement at the ependyma of the posterior right lateral ventricle (series 11, image 96). Regional mass effect including on the posterior right lateral ventricle appears stable. 10 mm central metastasis in the left cerebellar hemisphere is stable in size. No regional mass effect. There are multiple small cortically based areas of enhancement in the left occipital pole which appear to correspond to the small diffusion restricted areas thought to be ischemia on 09/19/2016 (e.g. Series 11, image 53). Of note, Of note, there are also multiple similar small cortically based areas of enhancement in the left parietal lobe, and these are in proximity to the small round cortically based enhancement which is stable on series 11, image 107 and which was thought to be metastatic on 09/19/2016. Evidence of new abnormal internal auditory canal enhancement left greater than right (series 11, images 43 and 44). This is also visible on the coronal and sagittal post-contrast images. No no other intracranial metastatic  disease identified. No leptomeningeal enhancement identified elsewhere. There is a new small focus of restricted diffusion in the left superolateral frontal lobe on series 5, image 44) without enhancement). There is also an increased focus of restricted diffusion in the central splenium of the corpus callosum on series 5, image 32 (no enhancement). No other ischemic related restricted diffusion. Stable intracranial mass effect and scattered nonenhancing nonspecific bilateral cerebral white matter T2 and FLAIR hyperintense foci. No acute intracranial hemorrhage identified. Negative pituitary and cervicomedullary junction. Vascular: Major intracranial vascular flow voids are stable. Skull and upper cervical spine: Indeterminate 15 mm enhancing right posterior calvarium lesion appears stable on series 11, image 100. There are additional smaller right calvarial enhancing lesions (such as on series 11, image 117. Negative visualized cervical spine and spinal cord. Sinuses/Orbits: Stable and negative. Other: Mastoids remain clear.  Negative scalp soft tissues. IMPRESSION: 1. Large 7 cm right occipital lobe enhancing mass with associated mass effect is stable since 09/19/2016. Note associated posterior right lateral ventricle ependymal enhancement suspicious for extension along the ventricle. 2. New abnormal bilateral Internal Auditory Canal enhancement highly suspicious for leptomeningeal metastatic disease, although no other leptomeningeal enhancement is identified. 3. 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 4. Multiple small cortically based areas of enhancement along the posterior left hemisphere appear to be post ischemic. This raises the possibility that the punctate enhancement (presumed third brain metastasis on 09/20/2015) in the left parietal lobe is instead post-ischemic. 5. New small acute lacunar infarct in the posterior left frontal lobe (series 5, image 44). Electronically Signed   By: Genevie Ann M.D.    On: 09/28/2016 18:14   Mr Jeri Cos PT Contrast  Result Date: 09/19/2016 CLINICAL DATA:  Abnormal head CT. Headaches. History breast cancer. EXAM: MRI HEAD WITHOUT AND WITH CONTRAST TECHNIQUE: Multiplanar, multiecho pulse sequences of the brain and surrounding structures were obtained without and with intravenous contrast. CONTRAST:  28m MULTIHANCE GADOBENATE DIMEGLUMINE 529 MG/ML IV SOLN COMPARISON:  Head CT from yesterday FINDINGS: Brain: Large heterogeneously enhancing mass in the posterior right cerebrum, spanning the parietal and occipital lobes and measuring up to 6 x 5 x 6.4 cm. This mass contains scattered cystic spaces and probable blood products. The overlying cortex is not visible and there is a thin line of overlying dural thickening. There is local mass effect, mild for the size of tumor. Mild vasogenic edema. 10 mm left cerebellar and 3  mm left parietal cortex enhancing masses, overall pattern suggesting metastatic disease. Punctate areas of cortical diffusion hyperintensity in the left occipital pole consistent with small infarcts. Mild restricted diffusion seen in the right cerebellum, suggesting subacute ischemia. Diffusion within the right-sided mass from blood products or other paramagnetic substance. There is scattered FLAIR hyperintensities in the cerebral white matter consistent with chronic small vessel ischemia. Vascular: Major flow voids are preserved. Skull and upper cervical spine: No aggressive marrow lesion. Right parietal bone lesion has intrinsic T1 hyperintensity most consistent with hemangioma. Sinuses/Orbits: Bilateral cataract resection. Mild chronic sinusitis. IMPRESSION: 1. Metastatic pattern with 3 brain masses. The largest is in the posterior right cerebrum measuring up to 6.4 cm; local mass effect. The other lesions measure 1 cm in the left cerebellum and 3 mm in the left parietal cortex. 2. 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 Subacute ischemia in  the upper right cerebellum. Electronically Signed   By: Monte Fantasia M.D.   On: 09/19/2016 07:49   Ct Abdomen Pelvis W Contrast  Result Date: 09/19/2016 CLINICAL DATA:  Remote history of breast cancer with recent brain MRI demonstrating metastatic brain lesions. Restaging CT scans. EXAM: CT CHEST, ABDOMEN, AND PELVIS WITH CONTRAST TECHNIQUE: Multidetector CT imaging of the chest, abdomen and pelvis was performed following the standard protocol during bolus administration of intravenous contrast. CONTRAST:  15m ISOVUE-300 IOPAMIDOL (ISOVUE-300) INJECTION 61% COMPARISON:  None. FINDINGS: CT CHEST FINDINGS Cardiovascular: The heart is normal in size. No pericardial effusion. Mild tortuosity and calcification of the thoracic aorta but no aneurysm or dissection. The branch vessels are patent. Scattered coronary artery calcifications are noted. Mediastinum/Nodes: Necrotic appearing mediastinal lymphadenopathy. 3.3 x 2.1 cm nodal mass in the aorticopulmonary window on image number 25. There is also cystic/necrotic subcarinal lymphadenopathy with maximum short axis diameter of 25 mm on image number 33. Other scattered smaller lymph nodes are noted including a 9 mm right infrahilar lymph node on image number 38. The esophagus is grossly normal. Lungs/Pleura: Diffuse pulmonary metastatic disease with numerous bilateral pulmonary nodules. Index lesion in the right middle lobe on image number 115 measures 12 x 11 mm. Left upper lobe nodule on image number 85 measures 7 x 5 mm. Right lower lobe lesion just above the right hemidiaphragm on image number 124 measures 14.5 x 12.5 mm. Large necrotic lesion in the as ago esophageal recess could be a necrotic metastatic lung lesion or a necrotic lymph node. It measures 4.2 by 3.0 cm. Small bilateral pleural effusions and bibasilar atelectasis. Chest wall/ Musculoskeletal: Surgical clips surrounding a cystic area in the right breast, likely small liquified hematoma related to a  prior lumpectomy. No obvious breast masses. No axillary lymphadenopathy. No supraclavicular lymphadenopathy. The thyroid gland appears normal. No definite metastatic bone disease. There is a small sclerotic lesion noted in the T1 vertebral body and also in the T3 vertebral body. I do not however see any other definite lesions to suggest metastatic disease. CT ABDOMEN PELVIS FINDINGS Hepatobiliary: No definite findings for hepatic metastatic disease. Layering high attenuation material in the gallbladder could be sludge or stones. No findings for acute cholecystitis. Pancreas: No mass, inflammation or ductal dilatation. Spleen: Normal size.  No focal lesions. Adrenals/Urinary Tract: The adrenal glands are unremarkable. Somewhat irregular appearing left upper pole cystic lesion measures less than 20 Hounsfield units and is most likely a cyst. No worrisome renal lesions or hydronephrosis. Stomach/Bowel: The stomach, duodenum, small bowel and colon are grossly normal. No acute inflammatory  process, mass lesions or obstructive findings. Colonic diverticulosis without findings for acute diverticulitis. Vascular/Lymphatic: Moderate atherosclerotic calcifications involving a tortuous abdominal aorta but no focal aneurysm or dissection. The branch vessels are patent. The major venous structures are patent. 11.5 mm gastrohepatic ligament lymph node is noted on image number 60. There is also a low-attenuation retrocrural lymph node measures 11 mm on image number 59. No mesenteric or retroperitoneal mass or lymphadenopathy. Reproductive: The enlarged fibroid uterus. The ovaries appear normal. Other: No pelvic lymphadenopathy. There are enlarged right inguinal lymph nodes. Suspect postoperative changes in the right inguinal area. Musculoskeletal: No obvious metastatic bone disease. IMPRESSION: 1. Pulmonary metastatic disease and fairly extensive necrotic appearing mediastinal and hilar lymphadenopathy. 2. No obvious breast masses  and no supraclavicular or axillary adenopathy. 3. No obvious abdominal/pelvic metastatic disease or osseous metastatic disease. A few small upper thoracic sclerotic bone lesions require observation. 4. Enlarged fibroid uterus. 5. Enlarged right inguinal lymph nodes and possible prior postoperative changes. Electronically Signed   By: Marijo Sanes M.D.   On: 09/19/2016 14:45   US Biopsy  Result Date: 09/20/2016 INDICATION: METASTATIC DISEASE, BRAIN METASTASES, INGUINAL ADENOPATHY, UNKNOWN PRIMARY EXAM: ULTRASOUND RIGHT INGUINAL ADENOPATHY 18 GAUGE CORE BIOPSY MEDICATIONS: 1% lidocaine local ANESTHESIA/SEDATION: Moderate (conscious) sedation was employed during this procedure. A total of Versed 2.0 mg and Fentanyl 100 mcg was administered intravenously. Moderate Sedation Time: 10 Minutes. The patient's level of consciousness and vital signs were monitored continuously by radiology nursing throughout the procedure under my direct supervision. FLUOROSCOPY TIME:  Fluoroscopy Time: None. COMPLICATIONS: None immediate. PROCEDURE: Informed written consent was obtained from the patient after a thorough discussion of the procedural risks, benefits and alternatives. All questions were addressed. Maximal Sterile Barrier Technique was utilized including caps, mask, sterile gowns, sterile gloves, sterile drape, hand hygiene and skin antiseptic. A timeout was performed prior to the initiation of the procedure. Previous imaging reviewed. Preliminary ultrasound performed. Right inguinal abnormal adenopathy localized. Overlying skin marked. Under sterile conditions and local anesthesia, an 18 gauge core biopsy medial was advanced to the right inguinal abnormal adenopathy. 5 18 gauge core biopsies obtained. Samples placed in saline. Needle removed. Postprocedure imaging demonstrates no hemorrhage or hematoma. Patient tolerated the biopsy well. No Complication. IMPRESSION: Successful ultrasound right inguinal adenopathy 18 gauge  core biopsies Electronically Signed   By: Jerilynn Mages.  Shick M.D.   On: 09/20/2016 12:50      ASSESSMENT: 76 y.o.  [facility] woman presenting with headaches, dizziness, and vomiting 09/18/2016, initial noncontrasted CT showing a large right parieto-occipital hypodensity, status post inguinal node biopsy 09/20/2016 with results pending  (1) status post right lumpectomy 2010 in [place], followed by radiation; no chemotherapy or anti-estrogens  METASTATIC DISEASE: August 2018 (2) brain MRI 09/19/2016 shows 3 brain masses the largest measuring 6.4 cm, involving right cerebrum, cerebellum, and left parietal cortex; CT of the chest abdomen and pelvis 09/19/2016 shows multiple bilateral pulmonary nodules, small bilateral effusions, mediastinal,subcarinal and aortopulmonary window adenopathy, no liver or bone involvement (a) right inguinal lymph node core biopsy 09/20/2016, results pending             (b) CA 27-29 is informative (this means we are dealing with breast cancer)  (c) CT chest/abd/pelvis 09/19/2016 demonstrated pulmonary metastatic disease and fairly extensive necrotic  appearing mediastinal and hilar lymphadenopathy.    (3) Radiation with Dr. Lisbeth Renshaw is pending  (4) Lapatinib 729m daily started on 10/16/2016  (5) Abraxane and Trastuzumab following radiation  PLAN: NLinus Mako and  I spent a great deal of time talking about her cancer, and the plan as Dr. Jana Hakim and I reviewed it.  He also came in and spoke with the patient and [name] as well.  Her plan is to undergo radiation with Dr. Lisbeth Renshaw and start on Lapatinib.  [facility]  I called our oral oncology pharmacist JJohny Drillingas well and informed her about the patient. NKaylaniwill also undergo treatment with Abraxane and Trastuzumab once she has completed her radiation.  She will start this in late October.  I went ahead  and put in the request for her to go to chemo class, and recommended that she bring one of her children to it as well.  She does not want a port initially, so we will not place it with the understanding that she will need to receive her treatment peripherally and we may have to place the port in the future if we have IV access issues.  She did have an echocardiogram done on 09/20/2016 and it demonstrated a LVEF of 60-65%.  I reviewed that she will need to follow up with our cardio-oncology clinic every 3 months for echocardiograms and evaluation for monitoring of heart.  I reviewed that Trastuzumab can weaken her heart, so we have to keep a watchful eye over it and our cardio-oncology team is excellent to do this.  I gave her detailed information in her AVS about Abraxane, Lapatinib, and Trastuzumab.  In her CT scans there were some upper thoracic sclerotic lesions that were noted, and I ordered a bone scan to better evaluate.  The patient and her daughter verbalize understanding with this plan.    Dispo:  -Follow up October 12 with Dr. MJana Hakimfor lab and appointment   NAllisonhas a good understanding of the overall plan. She agrees with it. She knows the goal of treatment in her case is cure. She will call with any problems that may develop before her next visit here.   LScot Dock NP   10/16/2016 8:56 AM Medical Oncology and Hematology [facility] [id number]. [phone]   Fax. [phone]  ADDENDUM: I discussed the pathology results with NRishitaand her daughter. It is very favorable that her tumor is HER-2 positive. This means we do not have to lean completely on chemotherapy, but can use anti-HER-2 treatment.  In addition to Herceptin she will benefit from lapatinib. We can actually start that now, at 750 mg daily. She was cautioned regarding the possible toxicities, side effects and complications of this agent and she will let uKoreaknow  if particularly diarrhea occurs.  Otherwise as soon as she is done with her radiation treatments she will see uKoreaagain and we will get going on chemotherapy/anti-HER-2 therapy.   I personally saw this patient and performed a substantive portion of this encounter with the listed APP documented above.   MChauncey Cruel MD Medical Oncology and Hematology [id number] [id number]. [phone]   Fax. [phone]

## 2016-10-16 NOTE — Progress Notes (Signed)
Radiation Oncology         [phone] ________________________________  Name: Deanna Washington                 MRN: [id number]  Date of Service: 09/19/16     DOB: [birth year]  JH:ERDEYCX, Deanna Him, MD  No ref. provider found     REFERRING PHYSICIAN: No ref. provider found   DIAGNOSIS: Metastasis to brain [facility]) - Plan: Ambulatory referral to Social Work   HISTORY OF PRESENT ILLNESS: [name] is a 76 y.o.  female with diagnosis of recurrent metastatic HER2 amplified breast cancer who was found to have recurrent disease when she recently presented to the ED with progressive headache and after episodes of nausea and vomiting. Her breast cancer was treated surgically and with adjuvant XRT to the right breast in 2010 in [place]. When she presented to the ED, A CT revealed a large mass in the right parieto occipital region with right to left midline shift of 3 mm. She was admitted and decadron was started. An MRI of the brain today revealed a 6 x 5 x 6.4 cm mass in the right cerebrum spanning the parietal and occipital lobes and containing scatered cystic spaces, there is local mass effects and 10 mm lesion on the left cerebella and 3 mm left parietal cortex masses. She was scanned with CT C/A/P today which revealed concerns for metastatic pulmonary disease involving both lungs and mediastinal adenopathy. There was also right inguinal adenopathy which biopsy unfortunately was non diagnostic. She underwent craniotomy on 10/03/16 with Dr. Saintclair Halsted and final pathology revealed her to apply metastatic breast cancer. 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 As a result we discussed her case again multidisciplinary conference and recommendations been made.    PREVIOUS RADIATION THERAPY: Yes   2010: radiotherapy to the right breast following lumpectomy in [place]   PAST  MEDICAL HISTORY:      Past Medical History:  Diagnosis Date  . Asthma   . Breast cancer ([place])   . HTN (hypertension)        PAST SURGICAL HISTORY:      Past Surgical History:  Procedure Laterality Date  . BREAST LUMPECTOMY       FAMILY HISTORY: no known history of malignancy  SOCIAL HISTORY:  reports that she has never smoked. She has never used smokeless tobacco. The patient is married and her husband is also a patient of Dr. Ida Rogue in the process of receiving radiotherapy. The patient is married and lives in [place]. Her husband is enrolled in hospice.    ALLERGIES: Patient has no known allergies.   MEDICATIONS:  Current Outpatient Prescriptions  Medication Sig Dispense Refill  . ADVAIR DISKUS 500-50 MCG/DOSE AEPB Place 1 puff into alternate nostrils 2 (two) times daily.    Marland Kitchen amiodarone (PACERONE) 200 MG tablet Take 100 mg by mouth 2 (two) times daily. 1/2 tablet by mouth twice a day     . dexamethasone (DECADRON) 4 MG tablet Take 2 tablets (8 mg total) by mouth 2 (two) times daily with a meal. 120 tablet 0  . diphenhydrAMINE (BENADRYL) 25 mg capsule Take 25 mg by mouth at bedtime as needed for sleep.     Marland Kitchen HYDROcodone-acetaminophen (NORCO/VICODIN) 5-325 MG tablet Take 1 tablet by mouth every 4 (four) hours as needed for moderate pain. 30 tablet 0  . lapatinib (TYKERB) 250  MG tablet Take 3 tablets (750 mg total) by mouth daily. 90 tablet 0  . levETIRAcetam (KEPPRA) 500 MG tablet Take 1 tablet (500 mg total) by mouth 2 (two) times daily. 60 tablet 1  . ondansetron (ZOFRAN) 4 MG tablet Take 1 tablet (4 mg total) by mouth every 4 (four) hours as needed for nausea or vomiting. 20 tablet 0  . pantoprazole (PROTONIX) 40 MG tablet Take 1 tablet (40 mg total) by mouth daily. 30 tablet 0  . PROAIR HFA 108 (90 Base) MCG/ACT inhaler Place 1-2 puffs into alternate nostrils every 4 (four) hours as needed for wheezing.    Marland Kitchen atorvastatin (LIPITOR) 20 MG tablet Take 1  tablet (20 mg total) by mouth daily. (Patient not taking: Reported on 10/16/2016) 30 tablet 0   No current facility-administered medications for this encounter.       REVIEW OF SYSTEMS: On review of systems, the patient reports that she is doing okay. She's having trouble sleeping as a result of her dexamethasone. She denies any chest pain, shortness of breath, cough, fevers, chills, night sweats, unintended weight changes. She denies dysphagia. She denies any bowel or bladder disturbances, and denies abdominal pain, nausea or vomiting. She denies any new musculoskeletal or joint aches or pains, new skin lesions or concerns. She's not having any headaches or blurred vision. No auditory changes are noted. She denies any dizziness but does complain of fatigue since her surgery. A complete review of systems is obtained and is otherwise negative.     PHYSICAL EXAM:  Wt Readings from Last 3 Encounters:  10/03/16 171 lb (77.6 kg)  09/21/16 169 lb 1.5 oz (76.7 kg)  09/19/16 148 lb (67.1 kg)   Temp Readings from Last 3 Encounters:  10/16/16 98.2 F (36.8 C) (Oral)  10/16/16 98.2 F (36.8 C) (Oral)  10/07/16 97.9 F (36.6 C) (Oral)   BP Readings from Last 3 Encounters:  10/16/16 129/78  10/16/16 129/78  10/07/16 136/85   Pulse Readings from Last 3 Encounters:  10/16/16 66  10/16/16 66  10/07/16 70   Pain Assessment Pain Score: 2  Pain Loc: Head/10  In general this is a well appearing caucasian female in no acute distress. She's alert and oriented x4 and appropriate throughout the examination. Cardiopulmonary assessment is negative for acute distress and she exhibits normal effort. The craniotomy incision site is intact with an intact running prolene suture. No erythema, cellulitic changes, or drainage is noted.HEENT reveals she has normocephalic atraumatic EOMs are intact. Oropharynx reveals a white plaques on the tongue and posterior pharynx.    ECOG = 1  0 - Asymptomatic  (Fully active, able to carry on all predisease activities without restriction)  1 - Symptomatic but completely ambulatory (Restricted in physically strenuous activity but ambulatory and able to carry out work of a light or sedentary nature. For example, light housework, office work)  2 - Symptomatic, <50% in bed during the day (Ambulatory and capable of all self care but unable to carry out any work activities. Up and about more than 50% of waking hours)  3 - Symptomatic, >50% in bed, but not bedbound (Capable of only limited self-care, confined to bed or chair 50% or more of waking hours)  4 - Bedbound (Completely disabled. Cannot carry on any self-care. Totally confined to bed or chair)  5 - Death   Eustace Pen MM, Creech RH, Tormey DC, et al. [phone]). "Toxicity and response criteria of the [facility]". [place] Oncol.  5 (6): 649-55        IMPRESSION/PLAN: 1.        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 Lisbeth Renshaw discusses the findings from her recent surgery, and reviews the rationale for whole brain radiotherapy. We discussed the risks, benefits, long and short side effects regarding radiotherapy. She is interested in moving forward, we will plan for simulation on Friday this week for 2 week course of whole brain radiation. She will also see Dr. Windy Carina, nurse practitioner on Friday to have her suture removed. Written consent is obtained and placed in the chart, a copy was provided to the patient. 2. Claustrophobia. We discussed the use of Ativan 30 minutes prior to simulation and treatment given the mass that we will be using for positioning. This was called into her pharmacy. 3. Oropharyngeal candida. A prescription was sent in for nystatin suspension that she is unable to take Diflucan due to amiodarone. 4. Asthma. The patient requested a refill of her Advair Diskus. This is called per pharmacy as well. She will  follow up with her primary provider for further refills.      Carola Rhine, PAC

## 2016-10-16 NOTE — Patient Instructions (Signed)
Trastuzumab injection for infusion What is this medicine? TRASTUZUMAB (tras TOO zoo mab) is a monoclonal antibody. It is used to treat breast cancer and stomach cancer. This medicine may be used for other purposes; ask your health care provider or pharmacist if you have questions. COMMON BRAND NAME(S): Herceptin What should I tell my health care provider before I take this medicine? They need to know if you have any of these conditions: -heart disease -heart failure -lung or breathing disease, like asthma -an unusual or allergic reaction to trastuzumab, benzyl alcohol, or other medications, foods, dyes, or preservatives -pregnant or trying to get pregnant -breast-feeding How should I use this medicine? This drug is given as an infusion into a vein. It is administered in a hospital or clinic by a specially trained health care professional. Talk to your pediatrician regarding the use of this medicine in children. This medicine is not approved for use in children. Overdosage: If you think you have taken too much of this medicine contact a poison control center or emergency room at once. NOTE: This medicine is only for you. Do not share this medicine with others. What if I miss a dose? It is important not to miss a dose. Call your doctor or health care professional if you are unable to keep an appointment. What may interact with this medicine? This medicine may interact with the following medications: -certain types of chemotherapy, such as daunorubicin, doxorubicin, epirubicin, and idarubicin This list may not describe all possible interactions. Give your health care provider a list of all the medicines, herbs, non-prescription drugs, or dietary supplements you use. Also tell them if you smoke, drink alcohol, or use illegal drugs. Some items may interact with your medicine. What should I watch for while using this medicine? Visit your doctor for checks on your progress. Report any side effects.  Continue your course of treatment even though you feel ill unless your doctor tells you to stop. Call your doctor or health care professional for advice if you get a fever, chills or sore throat, or other symptoms of a cold or flu. Do not treat yourself. Try to avoid being around people who are sick. You may experience fever, chills and shaking during your first infusion. These effects are usually mild and can be treated with other medicines. Report any side effects during the infusion to your health care professional. Fever and chills usually do not happen with later infusions. Do not become pregnant while taking this medicine or for 7 months after stopping it. Women should inform their doctor if they wish to become pregnant or think they might be pregnant. Women of child-bearing potential will need to have a negative pregnancy test before starting this medicine. There is a potential for serious side effects to an unborn child. Talk to your health care professional or pharmacist for more information. Do not breast-feed an infant while taking this medicine or for 7 months after stopping it. Women must use effective birth control with this medicine. What side effects may I notice from receiving this medicine? Side effects that you should report to your doctor or health care professional as soon as possible: -allergic reactions like skin rash, itching or hives, swelling of the face, lips, or tongue -chest pain or palpitations -cough -dizziness -feeling faint or lightheaded, falls -fever -general ill feeling or flu-like symptoms -signs of worsening heart failure like breathing problems; swelling in your legs and feet -unusually weak or tired Side effects that usually do not require medical   attention (report to your doctor or health care professional if they continue or are bothersome): -bone pain -changes in taste -diarrhea -joint pain -nausea/vomiting -weight loss This list may not describe all  possible side effects. Call your doctor for medical advice about side effects. You may report side effects to FDA at 1-800-FDA-1088. Where should I keep my medicine? This drug is given in a hospital or clinic and will not be stored at home. NOTE: This sheet is a summary. It may not cover all possible information. If you have questions about this medicine, talk to your doctor, pharmacist, or health care provider.  2018 Elsevier/Gold Standard (2016-01-09 14:37:52) Nanoparticle Albumin-Bound Paclitaxel injection What is this medicine? NANOPARTICLE ALBUMIN-BOUND PACLITAXEL (Na no PAHR ti kuhl al BYOO muhn-bound PAK li TAX el) is a chemotherapy drug. It targets fast dividing cells, like cancer cells, and causes these cells to die. This medicine is used to treat advanced breast cancer and advanced lung cancer. This medicine may be used for other purposes; ask your health care provider or pharmacist if you have questions. COMMON BRAND NAME(S): Abraxane What should I tell my health care provider before I take this medicine? They need to know if you have any of these conditions: -kidney disease -liver disease -low blood counts, like low platelets, red blood cells, or white blood cells -recent or ongoing radiation therapy -an unusual or allergic reaction to paclitaxel, albumin, other chemotherapy, other medicines, foods, dyes, or preservatives -pregnant or trying to get pregnant -breast-feeding How should I use this medicine? This drug is given as an infusion into a vein. It is administered in a hospital or clinic by a specially trained health care professional. Talk to your pediatrician regarding the use of this medicine in children. Special care may be needed. Overdosage: If you think you have taken too much of this medicine contact a poison control center or emergency room at once. NOTE: This medicine is only for you. Do not share this medicine with others. What if I miss a dose? It is important  not to miss your dose. Call your doctor or health care professional if you are unable to keep an appointment. What may interact with this medicine? -cyclosporine -diazepam -ketoconazole -medicines to increase blood counts like filgrastim, pegfilgrastim, sargramostim -other chemotherapy drugs like cisplatin, doxorubicin, epirubicin, etoposide, teniposide, vincristine -quinidine -testosterone -vaccines -verapamil Talk to your doctor or health care professional before taking any of these medicines: -acetaminophen -aspirin -ibuprofen -ketoprofen -naproxen This list may not describe all possible interactions. Give your health care provider a list of all the medicines, herbs, non-prescription drugs, or dietary supplements you use. Also tell them if you smoke, drink alcohol, or use illegal drugs. Some items may interact with your medicine. What should I watch for while using this medicine? Your condition will be monitored carefully while you are receiving this medicine. You will need important blood work done while you are taking this medicine. This medicine can cause serious allergic reactions. If you experience allergic reactions like skin rash, itching or hives, swelling of the face, lips, or tongue, tell your doctor or health care professional right away. In some cases, you may be given additional medicines to help with side effects. Follow all directions for their use. This drug may make you feel generally unwell. This is not uncommon, as chemotherapy can affect healthy cells as well as cancer cells. Report any side effects. Continue your course of treatment even though you feel ill unless your doctor tells you to stop.  Call your doctor or health care professional for advice if you get a fever, chills or sore throat, or other symptoms of a cold or flu. Do not treat yourself. This drug decreases your body's ability to fight infections. Try to avoid being around people who are sick. This medicine  may increase your risk to bruise or bleed. Call your doctor or health care professional if you notice any unusual bleeding. Be careful brushing and flossing your teeth or using a toothpick because you may get an infection or bleed more easily. If you have any dental work done, tell your dentist you are receiving this medicine. Avoid taking products that contain aspirin, acetaminophen, ibuprofen, naproxen, or ketoprofen unless instructed by your doctor. These medicines may hide a fever. Do not become pregnant while taking this medicine. Women should inform their doctor if they wish to become pregnant or think they might be pregnant. There is a potential for serious side effects to an unborn child. Talk to your health care professional or pharmacist for more information. Do not breast-feed an infant while taking this medicine. Men are advised not to father a child while receiving this medicine. What side effects may I notice from receiving this medicine? Side effects that you should report to your doctor or health care professional as soon as possible: -allergic reactions like skin rash, itching or hives, swelling of the face, lips, or tongue -low blood counts - This drug may decrease the number of white blood cells, red blood cells and platelets. You may be at increased risk for infections and bleeding. -signs of infection - fever or chills, cough, sore throat, pain or difficulty passing urine -signs of decreased platelets or bleeding - bruising, pinpoint red spots on the skin, black, tarry stools, nosebleeds -signs of decreased red blood cells - unusually weak or tired, fainting spells, lightheadedness -breathing problems -changes in vision -chest pain -high or low blood pressure -mouth sores -nausea and vomiting -pain, swelling, redness or irritation at the injection site -pain, tingling, numbness in the hands or feet -slow or irregular heartbeat -swelling of the ankle, feet, hands Side  effects that usually do not require medical attention (report to your doctor or health care professional if they continue or are bothersome): -aches, pains -changes in the color of fingernails -diarrhea -hair loss -loss of appetite This list may not describe all possible side effects. Call your doctor for medical advice about side effects. You may report side effects to FDA at 1-800-FDA-1088. Where should I keep my medicine? This drug is given in a hospital or clinic and will not be stored at home. NOTE: This sheet is a summary. It may not cover all possible information. If you have questions about this medicine, talk to your doctor, pharmacist, or health care provider.  2018 Elsevier/Gold Standard (2014-11-16 10:05:20) Lapatinib oral tablet What is this medicine? LAPATINIB (la PA ti nib) is a medicine that targets proteins in cancer cells and stops the cancer cells from growing. It is used to treat some advanced breast cancers. It is often used along with other cancer treatments. This medicine may be used for other purposes; ask your health care provider or pharmacist if you have questions. COMMON BRAND NAME(S): Tykerb What should I tell my health care provider before I take this medicine? They need to know if you have any of these conditions: -heart disease -heart rhythm problems (irregular heartbeat) -high blood pressure -history of low levels of potassium or magnesium -liver disease -an unusual reaction to  lapatinib, other medicines, foods, dyes, or preservatives -pregnant or trying to get pregnant -breast-feeding How should I use this medicine? Take this medicine by mouth with a glass of water. Take this medicine on an empty stomach, at least 1 hour before or after a meal. Do not take with food. Follow the directions on the prescription label. Do not take your medicine more often than directed. Do not stop taking except on your doctor's advice. Talk to your pediatrician regarding  the use of this medicine in children. Special care may be needed. This medicine is not approved for use in children. Overdosage: If you think you have taken too much of this medicine contact a poison control center or emergency room at once. NOTE: This medicine is only for you. Do not share this medicine with others. What if I miss a dose? If you miss a dose, take it as soon as you can. If it is almost time for your next dose, take only that dose. Do not take double or extra doses. What may interact with this medicine? Do not take this medicine with any of the following: -certain medicines for fungal infections like fluconazole, itraconazole, ketoconazole, posaconazole, voriconazole -cisapride -dofetilide -dronedarone -grapefruit or grapefruit juice -pimozide -thioridazine -ziprasidone This medicine may also interact with the following medications: -antiviral medicines for HIV or AIDS -certain antibiotics like clarithromycin, erythromycin, rifabutin, rifampin, rifapentine, telithromycin -certain medicines for depression, anxiety, or psychotic disturbances -certain medicines for irregular heart beat -certain medicines for seizures like carbamazepine, phenobarbital, phenytoin -certain medicines for stomach problems like cimetidine, famotidine, ranitidine, omeprazole -cyclosporine -daunorubicin -dexamethasone -diltiazem -doxorubicin -other medicines that prolong the QT interval (cause an abnormal heart rhythm) -St. John's Wort or other herbal supplements -verapamil This list may not describe all possible interactions. Give your health care provider a list of all the medicines, herbs, non-prescription drugs, or dietary supplements you use. Also tell them if you smoke, drink alcohol, or use illegal drugs. Some items may interact with your medicine. What should I watch for while using this medicine? Visit your doctor for checks on your progress. You will need to have regular blood tests  while on this medicine. Report any new symptoms promptly. Let your doctor know if you have any change in bowel patterns, nausea, loss of appetite, or vomiting, as these side effects may cause dehydration. If you have shortness of breath, contact your doctor. Possible serious side effects include heart or lung problems. Do not become pregnant while taking this medicine. Women should inform their doctor if they wish to become pregnant or think they might be pregnant. There is a potential for serious side effects to an unborn child. Talk to your health care professional or pharmacist for more information. Do not breast-feed an infant while taking this medicine. What side effects may I notice from receiving this medicine? Side effects that you should report to your doctor or health care professional as soon as possible: -allergic reactions like skin rash, itching or hives, swelling of the face, lips, or tongue -breathing problems -chest pain -cough that does not go away -dizziness -feeling faint or lightheaded, falls -fever or chills, sore throat -palpitations -redness, blistering, peeling or loosening of the skin, including inside the mouth -severe or persistent diarrhea -swelling of feet, legs -unusual bleeding, bruising -vomiting -yellowing of the eyes or skin Side effects that usually do not require medical attention (report to your doctor or health care professional if they continue or are bothersome): -decreased appetite -diarrhea -dry skin -heartburn -mouth  sores -nausea -red, numb, swollen, or painful hands or feet -stomach upset -tired This list may not describe all possible side effects. Call your doctor for medical advice about side effects. You may report side effects to FDA at 1-800-FDA-1088. Where should I keep my medicine? Keep out of the reach of children. Store at room temperature between 15 and 30 degrees C (59 and 86 degrees F). Protect from moisture. Keep tightly  closed. Throw away any unused medicine after the expiration date. NOTE: This sheet is a summary. It may not cover all possible information. If you have questions about this medicine, talk to your doctor, pharmacist, or health care provider.  2018 Elsevier/Gold Standard (2014-03-23 37:35:78)

## 2016-10-17 ENCOUNTER — Ambulatory Visit: Payer: Self-pay | Admitting: Radiation Oncology

## 2016-10-18 ENCOUNTER — Encounter: Payer: Self-pay | Admitting: Adult Health

## 2016-10-18 ENCOUNTER — Telehealth: Payer: Self-pay | Admitting: Pharmacist

## 2016-10-18 ENCOUNTER — Ambulatory Visit
Admission: RE | Admit: 2016-10-18 | Discharge: 2016-10-18 | Disposition: A | Discharge status: Home / Self Care | Payer: Medicare Other | Source: Ambulatory Visit | Attending: Radiation Oncology | Admitting: Radiation Oncology

## 2016-10-18 ENCOUNTER — Telehealth: Payer: Self-pay | Admitting: Cardiovascular Disease

## 2016-10-18 DIAGNOSIS — C799 Secondary malignant neoplasm of unspecified site: Secondary | ICD-10-CM

## 2016-10-18 DIAGNOSIS — C7931 Secondary malignant neoplasm of brain: Secondary | ICD-10-CM

## 2016-10-18 MED ORDER — LAPATINIB DITOSYLATE 250 MG PO TABS: 750.0000 mg | ORAL_TABLET | Freq: Every day | ORAL | 0 refills | Status: AC

## 2016-10-18 MED ORDER — FUROSEMIDE 20 MG PO TABS: 20.0000 mg | ORAL_TABLET | Freq: Every day | ORAL | 0 refills | Status: AC

## 2016-10-18 NOTE — Telephone Encounter (Signed)
Pt of Dr. Oval Linsey Hx HTN, a fib, acute embolic stroke, brain tumor, s/p craniotomy  Spoke to patient & verified verbal permission to speak to daughter regarding care. They will fill out a DPR when next in office.  We have discussed concerns in detail.  Pt was in hospital 2 weeks ago for brain surgery.  Daughter had noted that following hospitalization, patient had some swelling in left leg.  Now swelling in both ankles, more and more noticeable in last 3-4 days. Pt c/o tightness in legs and trouble w fitting compression stockings.  122/73 BP today. Daughter states possibly more SOB today Informs me neurology recommended she contact cardiology to advise if diuretic/appt needed.  Pt was on losartan-HCTZ in hospital, but apparently some confusion as to who prescribed (was initially reported as outpatient med prior to admission, but pt reported not taking this before going into hospital.). Upon leaving hospital, this med was discontinued.  Daughter needing some advice on how to proceed.  She was slated to see Isaac Laud on 10/1 but I recommended closer PA follow up - scheduled with [facility] on Monday 9/24. Will keep 10/1 appt for now as she may need 1-2 week follow up. Discussed w Dr. Oval Linsey who advised OK to proceed w initiation of furosemide 20mg  daily and home observation over weekend.  I relayed recommendations to daughter, who verbalized understanding and thanks.

## 2016-10-18 NOTE — Telephone Encounter (Signed)
New Message  Pt daughter call requesting to speak with RN about f/u appt with Almyra Deforest. Please call back to discuss

## 2016-10-18 NOTE — Telephone Encounter (Signed)
Unable to reach patient at her listed number. Caller is not listed on DPR.

## 2016-10-18 NOTE — Telephone Encounter (Signed)
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  Recent labs in Epic assessed, OK for treatment.  09/20/16 ECHO shows LVEF 60-65%  10/06/16 QTc 410 msec, 09/19/16 QTc 392 msec, 09/18/16 QTc 422 msec Patient with newly diagnosed atrial fibrillation, placed on amiodarone starting 09/19/16 Noted patient with cardiology follow-up 10/21/16.  Current medication list in Epic reviewed, DDIs with Tykerb identified:  Tykerb and amiodarone: category D interaction due to risk of QTc prolongation. QTc intervals as above, WNL. Recommend regular EKG at MD office visits at Tykerb initiation to monitor for prolongation.  Tykerb and Lipitor: Category C interaction due to Tykerb's inhibition of P-gp transporter protein leading to increased exposure to Lipitor. No change to therapy indicated at this time.  Prescription has been e-scribed to the [facility] for benefits analysis and approval.   I spoke with patient and daughter, [name], for overview of: Tykerb.   Counseled patient on administration, dosing, side effects, safe handling, and monitoring.  Patient will take Tykerb 243m tablets, 3 tablets (7515m by mouth once daily on an empty stomach, 1 hour before or 2 hours after a meal. Patient knows to avoid grapefruit and grapefruit juice. Tykerb start date: pending insurance authorization  Side effects include but not limited to: nausea, vomiting, diarrhea, prolonged QTc, rash, fatigue, and hand-foot syndrome.    Reviewed with patient importance of keeping a medication schedule and plan for any missed doses.  Patient and DeJackelyn Polingounseled about medication interactions as above.  NaIzora Galand Debbie voiced understanding and  appreciation.   All questions answered.  Will follow up with patient regarding insurance and pharmacy.   Patient knows to call the office with questions or concerns.  Oral Oncology Clinic will continue to follow for insurance authorization, copayment issues, and start date.  JeJohny DrillingPharmD, BCPS, BCOP 10/18/2016 3:39 PM Oral Oncology Clinic [phone]

## 2016-10-18 NOTE — Progress Notes (Signed)
Patient and daughter were waiting to see me while I was working on another patient. Went out to elevator area to speak with patient and daughter. Daughter advised patient is now a patient along with patient's spouse. Advised that I cannot meet with her today but gave my card and will meet on 10/28/16 after her chemo ed class.  Reviewed chart and not treatment plan available as of yet. Looks like she will have radiation first but I will still assist due to me having a relationship built with patient from assisting her spouse. Gave them my card for any additional financial questions or concerns.

## 2016-10-20 NOTE — Progress Notes (Signed)
  Radiation Oncology         [phone] ________________________________  Name: Deanna Washington MRN: [id number]  Date: 10/18/2016  DOB: 09-25-1940    Simulation and treatment planning note  DIAGNOSIS:     ICD-10-CM   1. Metastasis to brain [facility]) C79.31      The patient presented for simulation for the patient's upcoming course of whole brain radiation treatment. The patient was placed in a supine position and a customized thermoplastic head cast was constructed to aid in patient immobilization during the treatment. This complex treatment device will be used on a daily basis. In this fashion a CT scan was obtained through the head and neck region and isocenter was placed near midline within the brain.  The patient will be planned to receive a course of whole brain radiation treatment to a dose of 30 gray in 10 fractions at 3 gray per fraction. To accomplish this, 2 customized blocks have been designed which corresponds to left and right whole brain radiation fields. These 2 complex treatment devices will be used on a daily basis during the course of radiation. A complex isodose plan is requested to insure that the target area is adequately covered in to facilitate optimization of the treatment plan. A forward planning technique will also be evaluated to determine if this approach significantly improves the plan.   ________________________________   Jodelle Gross, MD, PhD

## 2016-10-21 ENCOUNTER — Other Ambulatory Visit: Payer: Self-pay | Admitting: Cardiovascular Disease

## 2016-10-21 ENCOUNTER — Ambulatory Visit: Payer: Medicare Other | Admitting: Cardiology

## 2016-10-21 ENCOUNTER — Telehealth: Payer: Self-pay | Admitting: Pharmacy Technician

## 2016-10-21 MED ORDER — AMIODARONE HCL 200 MG PO TABS: 100.0000 mg | ORAL_TABLET | Freq: Two times a day (BID) | ORAL | 5 refills | Status: AC

## 2016-10-21 NOTE — Telephone Encounter (Signed)
Oral Oncology Patient Advocate Encounter  Received notification from [facility] that prior authorization for Tykerb is required.  PA submitted on CoverMyMeds Key [id number] Status is pending  Oral Oncology Clinic will continue to follow.  Deanna Washington. Melynda Keller, [place] Patient [place] [phone] 10/21/2016 10:57 AM

## 2016-10-21 NOTE — Telephone Encounter (Signed)
New message     *STAT* If patient is at the pharmacy, call can be transferred to refill team.   1. Which medications need to be refilled? (please list name of each medication and dose if known) amiodarone (PACERONE) 200 MG tablet  2. Which pharmacy/location (including street and city if local pharmacy) is medication to be sent to? [facility] [place]  3. Do they need a 30 day or 90 day supply? 30 day

## 2016-10-21 NOTE — Telephone Encounter (Signed)
Rx(s) sent to pharmacy electronically.  

## 2016-10-22 ENCOUNTER — Emergency Department (HOSPITAL_COMMUNITY): Payer: Medicare Other

## 2016-10-22 ENCOUNTER — Inpatient Hospital Stay (HOSPITAL_COMMUNITY)
Admission: EM | Admit: 2016-10-22 | Discharge: 2016-10-28 | DRG: 064 | Disposition: E | Payer: Medicare Other | Attending: Internal Medicine | Admitting: Internal Medicine

## 2016-10-22 ENCOUNTER — Telehealth: Payer: Self-pay | Admitting: Cardiovascular Disease

## 2016-10-22 ENCOUNTER — Emergency Department (HOSPITAL_COMMUNITY)
Admit: 2016-10-22 | Discharge: 2016-10-22 | Disposition: A | Discharge status: Home / Self Care | Payer: Medicare Other | Attending: Emergency Medicine | Admitting: Emergency Medicine

## 2016-10-22 ENCOUNTER — Encounter (HOSPITAL_COMMUNITY): Payer: Self-pay | Admitting: Radiology

## 2016-10-22 DIAGNOSIS — J45909 Unspecified asthma, uncomplicated: Secondary | ICD-10-CM | POA: Diagnosis present

## 2016-10-22 DIAGNOSIS — I639 Cerebral infarction, unspecified: Secondary | ICD-10-CM | POA: Diagnosis not present

## 2016-10-22 DIAGNOSIS — E871 Hypo-osmolality and hyponatremia: Secondary | ICD-10-CM | POA: Diagnosis present

## 2016-10-22 DIAGNOSIS — Z9889 Other specified postprocedural states: Secondary | ICD-10-CM | POA: Diagnosis not present

## 2016-10-22 DIAGNOSIS — I110 Hypertensive heart disease with heart failure: Secondary | ICD-10-CM | POA: Diagnosis present

## 2016-10-22 DIAGNOSIS — J811 Chronic pulmonary edema: Secondary | ICD-10-CM | POA: Diagnosis present

## 2016-10-22 DIAGNOSIS — G40909 Epilepsy, unspecified, not intractable, without status epilepticus: Secondary | ICD-10-CM | POA: Diagnosis present

## 2016-10-22 DIAGNOSIS — H538 Other visual disturbances: Secondary | ICD-10-CM | POA: Diagnosis present

## 2016-10-22 DIAGNOSIS — F419 Anxiety disorder, unspecified: Secondary | ICD-10-CM | POA: Diagnosis present

## 2016-10-22 DIAGNOSIS — D496 Neoplasm of unspecified behavior of brain: Secondary | ICD-10-CM | POA: Diagnosis not present

## 2016-10-22 DIAGNOSIS — Z801 Family history of malignant neoplasm of trachea, bronchus and lung: Secondary | ICD-10-CM | POA: Diagnosis not present

## 2016-10-22 DIAGNOSIS — C7931 Secondary malignant neoplasm of brain: Secondary | ICD-10-CM | POA: Diagnosis present

## 2016-10-22 DIAGNOSIS — R29706 NIHSS score 6: Secondary | ICD-10-CM | POA: Diagnosis present

## 2016-10-22 DIAGNOSIS — M7989 Other specified soft tissue disorders: Secondary | ICD-10-CM | POA: Diagnosis present

## 2016-10-22 DIAGNOSIS — J90 Pleural effusion, not elsewhere classified: Secondary | ICD-10-CM | POA: Diagnosis present

## 2016-10-22 DIAGNOSIS — T380X5A Adverse effect of glucocorticoids and synthetic analogues, initial encounter: Secondary | ICD-10-CM | POA: Diagnosis present

## 2016-10-22 DIAGNOSIS — Y92009 Unspecified place in unspecified non-institutional (private) residence as the place of occurrence of the external cause: Secondary | ICD-10-CM

## 2016-10-22 DIAGNOSIS — R0600 Dyspnea, unspecified: Secondary | ICD-10-CM | POA: Diagnosis present

## 2016-10-22 DIAGNOSIS — R5383 Other fatigue: Secondary | ICD-10-CM | POA: Diagnosis present

## 2016-10-22 DIAGNOSIS — I4891 Unspecified atrial fibrillation: Secondary | ICD-10-CM | POA: Diagnosis not present

## 2016-10-22 DIAGNOSIS — Z79899 Other long term (current) drug therapy: Secondary | ICD-10-CM

## 2016-10-22 DIAGNOSIS — I63441 Cerebral infarction due to embolism of right cerebellar artery: Principal | ICD-10-CM | POA: Diagnosis present

## 2016-10-22 DIAGNOSIS — G9619 Other disorders of meninges, not elsewhere classified: Secondary | ICD-10-CM | POA: Diagnosis present

## 2016-10-22 DIAGNOSIS — M549 Dorsalgia, unspecified: Secondary | ICD-10-CM | POA: Diagnosis present

## 2016-10-22 DIAGNOSIS — R29707 NIHSS score 7: Secondary | ICD-10-CM | POA: Diagnosis not present

## 2016-10-22 DIAGNOSIS — R609 Edema, unspecified: Secondary | ICD-10-CM | POA: Diagnosis present

## 2016-10-22 DIAGNOSIS — G936 Cerebral edema: Secondary | ICD-10-CM | POA: Diagnosis present

## 2016-10-22 DIAGNOSIS — C7800 Secondary malignant neoplasm of unspecified lung: Secondary | ICD-10-CM | POA: Diagnosis present

## 2016-10-22 DIAGNOSIS — G9782 Other postprocedural complications and disorders of nervous system: Secondary | ICD-10-CM

## 2016-10-22 DIAGNOSIS — J189 Pneumonia, unspecified organism: Secondary | ICD-10-CM | POA: Diagnosis present

## 2016-10-22 DIAGNOSIS — Z66 Do not resuscitate: Secondary | ICD-10-CM | POA: Diagnosis present

## 2016-10-22 DIAGNOSIS — Z87891 Personal history of nicotine dependence: Secondary | ICD-10-CM | POA: Diagnosis not present

## 2016-10-22 DIAGNOSIS — Z853 Personal history of malignant neoplasm of breast: Secondary | ICD-10-CM

## 2016-10-22 DIAGNOSIS — Z79891 Long term (current) use of opiate analgesic: Secondary | ICD-10-CM

## 2016-10-22 DIAGNOSIS — I480 Paroxysmal atrial fibrillation: Secondary | ICD-10-CM | POA: Diagnosis present

## 2016-10-22 DIAGNOSIS — E785 Hyperlipidemia, unspecified: Secondary | ICD-10-CM | POA: Diagnosis present

## 2016-10-22 DIAGNOSIS — G9341 Metabolic encephalopathy: Secondary | ICD-10-CM | POA: Diagnosis present

## 2016-10-22 DIAGNOSIS — H53461 Homonymous bilateral field defects, right side: Secondary | ICD-10-CM | POA: Diagnosis present

## 2016-10-22 DIAGNOSIS — I071 Rheumatic tricuspid insufficiency: Secondary | ICD-10-CM | POA: Diagnosis present

## 2016-10-22 DIAGNOSIS — Z7952 Long term (current) use of systemic steroids: Secondary | ICD-10-CM

## 2016-10-22 DIAGNOSIS — Z515 Encounter for palliative care: Secondary | ICD-10-CM | POA: Diagnosis not present

## 2016-10-22 DIAGNOSIS — Z806 Family history of leukemia: Secondary | ICD-10-CM

## 2016-10-22 DIAGNOSIS — I5033 Acute on chronic diastolic (congestive) heart failure: Secondary | ICD-10-CM | POA: Diagnosis present

## 2016-10-22 DIAGNOSIS — G960 Cerebrospinal fluid leak: Secondary | ICD-10-CM | POA: Diagnosis present

## 2016-10-22 DIAGNOSIS — Z800 Family history of malignant neoplasm of digestive organs: Secondary | ICD-10-CM

## 2016-10-22 DIAGNOSIS — D72829 Elevated white blood cell count, unspecified: Secondary | ICD-10-CM | POA: Diagnosis present

## 2016-10-22 DIAGNOSIS — R278 Other lack of coordination: Secondary | ICD-10-CM | POA: Diagnosis present

## 2016-10-22 DIAGNOSIS — J4530 Mild persistent asthma, uncomplicated: Secondary | ICD-10-CM | POA: Diagnosis not present

## 2016-10-22 DIAGNOSIS — R471 Dysarthria and anarthria: Secondary | ICD-10-CM | POA: Diagnosis present

## 2016-10-22 DIAGNOSIS — Z7951 Long term (current) use of inhaled steroids: Secondary | ICD-10-CM

## 2016-10-22 HISTORY — DX: Secondary malignant neoplasm of unspecified lung: C78.00

## 2016-10-22 HISTORY — DX: Secondary malignant neoplasm of brain: C79.31

## 2016-10-22 LAB — URINALYSIS, COMPLETE (UACMP) WITH MICROSCOPIC
BILIRUBIN URINE: NEGATIVE
Glucose, UA: NEGATIVE mg/dL
HGB URINE DIPSTICK: NEGATIVE
Ketones, ur: NEGATIVE mg/dL
LEUKOCYTES UA: NEGATIVE
Nitrite: NEGATIVE
PROTEIN: NEGATIVE mg/dL
Specific Gravity, Urine: 1.01 (ref 1.005–1.030)
pH: 7 (ref 5.0–8.0)

## 2016-10-22 LAB — COMPREHENSIVE METABOLIC PANEL
ALBUMIN: 3.2 g/dL — AB (ref 3.5–5.0)
ALT: 67 U/L — ABNORMAL HIGH (ref 14–54)
ANION GAP: 10 (ref 5–15)
AST: 25 U/L (ref 15–41)
Alkaline Phosphatase: 94 U/L (ref 38–126)
BUN: 19 mg/dL (ref 6–20)
CHLORIDE: 96 mmol/L — AB (ref 101–111)
CO2: 26 mmol/L (ref 22–32)
Calcium: 8.6 mg/dL — ABNORMAL LOW (ref 8.9–10.3)
Creatinine, Ser: 0.67 mg/dL (ref 0.44–1.00)
GFR calc Af Amer: >60 mL/min (ref >60)
GFR calc non Af Amer: >60 mL/min (ref >60)
GLUCOSE: 127 mg/dL — AB (ref 65–99)
POTASSIUM: 4 mmol/L (ref 3.5–5.1)
SODIUM: 132 mmol/L — AB (ref 135–145)
TOTAL PROTEIN: 5.8 g/dL — AB (ref 6.5–8.1)
Total Bilirubin: 0.6 mg/dL (ref 0.3–1.2)

## 2016-10-22 LAB — CBC WITH DIFFERENTIAL/PLATELET
BASOS ABS: 0 10*3/uL (ref 0.0–0.1)
Basophils Relative: 0 %
Eosinophils Absolute: 0 10*3/uL (ref 0.0–0.7)
Eosinophils Relative: 0 %
HEMATOCRIT: 44.3 % (ref 36.0–46.0)
Hemoglobin: 15.5 g/dL — ABNORMAL HIGH (ref 12.0–15.0)
LYMPHS ABS: 0.6 10*3/uL — AB (ref 0.7–4.0)
LYMPHS PCT: 4 %
MCH: 31.8 pg (ref 26.0–34.0)
MCHC: 35 g/dL (ref 30.0–36.0)
MCV: 90.8 fL (ref 78.0–100.0)
MONOS PCT: 5 %
Monocytes Absolute: 0.6 10*3/uL (ref 0.1–1.0)
NEUTROS ABS: 12.7 10*3/uL — AB (ref 1.7–7.7)
Neutrophils Relative %: 91 %
Platelets: 335 10*3/uL (ref 150–400)
RBC: 4.88 MIL/uL (ref 3.87–5.11)
RDW: 12.9 % (ref 11.5–15.5)
WBC: 14 10*3/uL — ABNORMAL HIGH (ref 4.0–10.5)

## 2016-10-22 LAB — CBG MONITORING, ED: GLUCOSE-CAPILLARY: 128 mg/dL — AB (ref 65–99)

## 2016-10-22 LAB — BRAIN NATRIURETIC PEPTIDE: B NATRIURETIC PEPTIDE 5: 47.5 pg/mL (ref 0.0–100.0)

## 2016-10-22 MED ORDER — ACETAMINOPHEN 650 MG RE SUPP: 650.0000 mg | RECTAL | Status: DC | PRN

## 2016-10-22 MED ORDER — AMIODARONE HCL 200 MG PO TABS
100.0000 mg | ORAL_TABLET | Freq: Two times a day (BID) | ORAL | Status: DC
  Administered 2016-10-23 – 2016-10-26 (×8): 100 mg via ORAL
  Filled 2016-10-22 (×8): qty 1

## 2016-10-22 MED ORDER — SODIUM CHLORIDE 0.9 % IV SOLN: INTRAVENOUS | Status: DC

## 2016-10-22 MED ORDER — STROKE: EARLY STAGES OF RECOVERY BOOK
Freq: Once | Status: AC
Start: 2016-10-22 — End: 2016-10-23
  Administered 2016-10-23

## 2016-10-22 MED ORDER — FUROSEMIDE 10 MG/ML IJ SOLN: 40.0000 mg | Freq: Once | INTRAMUSCULAR | Status: DC

## 2016-10-22 MED ORDER — ATORVASTATIN CALCIUM 10 MG PO TABS
20.0000 mg | ORAL_TABLET | Freq: Every day | ORAL | Status: DC
  Administered 2016-10-23: 20 mg via ORAL
  Filled 2016-10-22: qty 2

## 2016-10-22 MED ORDER — LEVETIRACETAM 500 MG PO TABS
500.0000 mg | ORAL_TABLET | Freq: Two times a day (BID) | ORAL | Status: DC
  Administered 2016-10-23 – 2016-10-26 (×8): 500 mg via ORAL
  Filled 2016-10-22 (×9): qty 1

## 2016-10-22 MED ORDER — DEXAMETHASONE 4 MG PO TABS
8.0000 mg | ORAL_TABLET | Freq: Two times a day (BID) | ORAL | Status: DC
  Administered 2016-10-23 – 2016-10-26 (×8): 8 mg via ORAL
  Filled 2016-10-22 (×8): qty 2

## 2016-10-22 MED ORDER — DEXAMETHASONE 4 MG PO TABS
4.0000 mg | ORAL_TABLET | Freq: Every day | ORAL | Status: DC
  Administered 2016-10-23 – 2016-10-24 (×2): 4 mg via ORAL
  Filled 2016-10-22 (×2): qty 1

## 2016-10-22 MED ORDER — ACETAMINOPHEN 325 MG PO TABS
650.0000 mg | ORAL_TABLET | ORAL | Status: DC | PRN
  Administered 2016-10-23 – 2016-10-24 (×5): 650 mg via ORAL
  Filled 2016-10-22 (×5): qty 2

## 2016-10-22 MED ORDER — HYDRALAZINE HCL 20 MG/ML IJ SOLN: 10.0000 mg | Freq: Three times a day (TID) | INTRAMUSCULAR | Status: DC | PRN

## 2016-10-22 MED ORDER — LORAZEPAM 1 MG PO TABS: 1.0000 mg | ORAL_TABLET | Freq: Four times a day (QID) | ORAL | Status: DC | PRN

## 2016-10-22 MED ORDER — PANTOPRAZOLE SODIUM 40 MG PO TBEC
40.0000 mg | DELAYED_RELEASE_TABLET | Freq: Every day | ORAL | Status: DC
  Administered 2016-10-23 – 2016-10-26 (×4): 40 mg via ORAL
  Filled 2016-10-22 (×4): qty 1

## 2016-10-22 MED ORDER — ALBUTEROL SULFATE (2.5 MG/3ML) 0.083% IN NEBU: 3.0000 mL | INHALATION_SOLUTION | RESPIRATORY_TRACT | Status: DC | PRN

## 2016-10-22 MED ORDER — ACETAMINOPHEN 160 MG/5ML PO SOLN: 650.0000 mg | ORAL | Status: DC | PRN

## 2016-10-22 MED ORDER — MOMETASONE FURO-FORMOTEROL FUM 200-5 MCG/ACT IN AERO
2.0000 | INHALATION_SPRAY | Freq: Two times a day (BID) | RESPIRATORY_TRACT | Status: DC
  Administered 2016-10-23 – 2016-10-26 (×6): 2 via RESPIRATORY_TRACT
  Filled 2016-10-22 (×2): qty 8.8

## 2016-10-22 NOTE — ED Provider Notes (Signed)
[facility] DEPT Provider Note   CSN: [id number] Arrival date & time: 10/21/2016  1429     History   Chief Complaint Chief Complaint  Patient presents with  . [facility]  . Lethargic  . Leg Swelling    HPI Deanna Washington is a 76 y.o. female.  HPI  Patient visits with family member who relates the history of present illness. Patient has history of recurrent breast cancer, discovered one month ago, after she had listlessness. In the evaluation the patient was found to have brain metastases, lung metastases. Patient had craniotomy one week ago, with resection of brain metastases. Now, over the past 2 or 3 days the patient has had increasing diffuse swelling, and listlessness. The patient awakens, but also sleeping quickly, corroborates the history, but the daughter essentially provides all details. In addition to the recurrent cancer, the patient has a history of chronic lower extremity edema, though nothing as profound as what has occurred over the past few days. Patient does take Lasix daily. No noted fever, neck pain, overt confusion. Patient saw her neurosurgeon today and is referred for evaluation. She has not yet started radiation therapy or chemotherapy.   Past Medical History:  Diagnosis Date  . Asthma   . 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 Breast cancer [facility])    believes it was in 2010  . HTN (hypertension)   . 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 Status post craniotomy 10/03/2016  . Metastasis to brain ([place]) 10/03/2016  . Brain tumor ([place]) 09/19/2016  . Asthma 09/19/2016  . Essential hypertension 09/19/2016  . Acute embolic stroke ([place]) 95/62/1308  . Atrial fibrillation, new onset ([place]) 09/19/2016  . Sinus bradycardia 09/19/2016  . Metastatic cancer ([place]) 09/19/2016  . Bradycardia   . Tachy-brady syndrome [facility])     Past Surgical History:  Procedure  Laterality Date  . BREAST LUMPECTOMY     lumpectomy  . CRANIOTOMY Right 10/03/2016   Procedure: Right Occipital CRANIOTOMY TUMOR EXCISION with Curve;  Surgeon: Kary Kos, MD;  Location: [place];  Service: Neurosurgery;  Laterality: Right;    OB History    No data available       Home Medications    Prior to Admission medications   Medication Sig Start Date End Date Taking? Authorizing Provider  ADVAIR DISKUS 500-50 MCG/DOSE AEPB Inhale 1 puff into the lungs 2 (two) times daily. 10/16/16  Yes Hayden Pedro, PA-C  amiodarone (PACERONE) 200 MG tablet Take 0.5 tablets (100 mg total) by mouth 2 (two) times daily. 10/21/16  Yes Skeet Latch, MD  dexamethasone (DECADRON) 4 MG tablet Take 2 tablets (8 mg total) by mouth 2 (two) times daily with a meal. Patient taking differently: Take 4-8 mg by mouth 3 (three) times daily. Take 8mg  by mouth in the morning, 4mg  in the afternoon, and 8mg  at bedtime 09/24/16  Yes Short, Noah Delaine, MD  diphenhydrAMINE (BENADRYL) 25 mg capsule Take 25 mg by mouth at bedtime as needed for sleep.    Yes [provider]  furosemide (LASIX) 20 MG tablet Take 1 tablet (20 mg total) by mouth daily. 10/18/16 01/16/17 Yes Skeet Latch, MD  HYDROcodone-acetaminophen (NORCO/VICODIN) 5-325 MG tablet Take 1 tablet by mouth every 4 (four) hours as needed for moderate pain. 10/07/16  Yes Meyran, Ocie Cornfield, NP  levETIRAcetam (KEPPRA) 500 MG tablet Take 1 tablet (500 mg total)  by mouth 2 (two) times daily. 10/07/16  Yes Meyran, Ocie Cornfield, NP  LORazepam (ATIVAN) 0.5 MG tablet 1 tablet po 30 minutes prior to radiation or MRI 10/16/16  Yes Hayden Pedro, PA-C  nystatin (MYCOSTATIN) 100000 UNIT/ML suspension Take 5 mLs (500,000 Units total) by mouth 4 (four) times daily. 10/16/16  Yes Hayden Pedro, PA-C  ondansetron (ZOFRAN) 4 MG tablet Take 1 tablet (4 mg total) by mouth every 4 (four) hours as needed for nausea or vomiting. 10/07/16  Yes  Meyran, Ocie Cornfield, NP  pantoprazole (PROTONIX) 40 MG tablet Take 1 tablet (40 mg total) by mouth daily. 09/24/16  Yes Short, Noah Delaine, MD  PROAIR HFA [phone] Base) MCG/ACT inhaler Place 1-2 puffs into alternate nostrils every 4 (four) hours as needed for wheezing. 08/01/16  Yes [provider]  atorvastatin (LIPITOR) 20 MG tablet Take 1 tablet (20 mg total) by mouth daily. Patient not taking: Reported on 10/16/2016 09/23/16 09/23/17  Janece Canterbury, MD  lapatinib (TYKERB) 250 MG tablet Take 3 tablets (750 mg total) by mouth daily. Take on an empty stomach, 1 hour before or 2 hours after a meal 10/18/16   Magrinat, Virgie Dad, MD    Family History Family History  Problem Relation Age of Onset  . Lung cancer Sister   . Esophageal cancer Brother   . Leukemia Maternal Aunt   . Lung cancer Brother   . Leukemia Brother   . Lung cancer Sister   . Breast cancer Neg Hx   . Colon cancer Neg Hx   . Pancreatic cancer Neg Hx     Social History Social History  Substance Use Topics  . Smoking status: Former Research scientist (life sciences)  . Smokeless tobacco: Never Used     Comment: quit 25 years ago  . Alcohol use 1.2 oz/week    2 Glasses of wine per week     Comment: 2 glasses a day     Allergies   Patient has no known allergies.   Review of Systems Review of Systems  Constitutional:       Per HPI, otherwise negative  HENT:       Per HPI, otherwise negative  Respiratory:       Per HPI, otherwise negative  Cardiovascular:       Per HPI, otherwise negative  Gastrointestinal: Negative for vomiting.  Endocrine:       Negative aside from HPI  Genitourinary:       Neg aside from HPI   Musculoskeletal:       Per HPI, otherwise negative  Skin: Negative.   Neurological: Positive for weakness, light-headedness and headaches.     Physical Exam Updated Vital Signs BP (!) 152/92 (BP Location: Right Arm)   Pulse 70   Temp 97.6 F (36.4 C) (Oral)   Resp 16   SpO2 93%   Physical Exam    Constitutional: She is oriented to person, place, and time. She has a sickly appearance. No distress.  HENT:  Head: Normocephalic and atraumatic.    Eyes: Conjunctivae and EOM are normal.  Cardiovascular: Normal rate and regular rhythm.   Pulmonary/Chest: No stridor. She has decreased breath sounds.  Abdominal: She exhibits no distension.  Musculoskeletal: She exhibits edema.  Diffuse bilateral lower extremity edema right greater than left, nontender  Neurological: She is oriented to person, place, and time. She displays atrophy. No cranial nerve deficit.  Skin: Skin is warm and dry.  Psychiatric: She has a normal mood and affect.  Nursing note and vitals reviewed.    ED Treatments / Results  Labs (all labs ordered are listed, but only abnormal results are displayed) Labs Reviewed  COMPREHENSIVE METABOLIC PANEL - Abnormal; Notable for the following:       Result Value   Sodium 132 (*)    Chloride 96 (*)    Glucose, Bld 127 (*)    Calcium 8.6 (*)    Total Protein 5.8 (*)    Albumin 3.2 (*)    ALT 67 (*)    All other components within normal limits  CBC WITH DIFFERENTIAL/PLATELET - Abnormal; Notable for the following:    WBC 14.0 (*)    Hemoglobin 15.5 (*)    Neutro Abs 12.7 (*)    Lymphs Abs 0.6 (*)    All other components within normal limits  URINALYSIS, COMPLETE (UACMP) WITH MICROSCOPIC - Abnormal; Notable for the following:    APPearance HAZY (*)    Bacteria, UA RARE (*)    Squamous Epithelial / LPF 0-5 (*)    All other components within normal limits  CBG MONITORING, ED - Abnormal; Notable for the following:    Glucose-Capillary 128 (*)    All other components within normal limits  BRAIN NATRIURETIC PEPTIDE    EKG  EKG Interpretation  Date/Time:  Tuesday October 22 2016 14:49:42 EDT Ventricular Rate:  71 PR Interval:    QRS Duration: 95 QT Interval:  399 QTC Calculation: 434 R Axis:   105 Text Interpretation:  Sinus rhythm Consider right atrial  enlargement Right axis deviation Artifact Abnormal ekg Confirmed by Carmin Muskrat [phone]) on 10/07/2016 3:11:16 PM       Radiology Ct Head Wo Contrast  Result Date: 10/12/2016 CLINICAL DATA:  Expressive aphasia, lethargy, difficulty standing, LEFT peripheral vision loss for 2 days. History of metastatic breast cancer on chemo radiation. Status post debulking of RIGHT occipital tumor. EXAM: CT HEAD WITHOUT CONTRAST TECHNIQUE: Contiguous axial images were obtained from the base of the skull through the vertex without intravenous contrast. COMPARISON:  MRI of the head October 04, 2016 and CT HEAD September 18, 2016 FINDINGS: BRAIN: RIGHT occipital resection cavity, re-expanded RIGHT occipital horn. No significant residual mass effect, surrounding low-density encephalomalacia. Moderate parenchymal brain volume loss for age. Patchy supratentorial white matter hypodensities, some of which reflect metastatic disease characterized on prior MRI. Subcentimeter LEFT temporal lobe metastasis better seen on prior MRI. No acute large vascular territory infarct. No intraparenchymal hemorrhage. No extra-axial fluid collections. Basal cisterns are patent. VASCULAR: Mild to moderate calcific atherosclerosis of the carotid siphons. SKULL: Status post RIGHT parieto-occipital craniotomy. RIGHT calvarial suspected metastasis better characterized on prior MRI. RIGHT parieto-occipital scalp fluid collection measuring 6.2 x 1.4 cm. Borderline partially empty sella. SINUSES/ORBITS: Mild paranasal sinus mucosal thickening without air-fluid levels. Mastoid air cells are well aerated. The included ocular globes and orbital contents are non-suspicious. Status post bilateral ocular lens implants. OTHER: None. IMPRESSION: 1. Interval RIGHT craniotomy for debulking occipital lobe tumor, improved appearance with decreased mass effect and re-expanded RIGHT occipital horn. 2. 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 3. Moderate white matter changes consistent chronic small vessel ischemic disease and known metastasis. Electronically Signed   By: Elon Alas M.D.   On: 10/10/2016 16:47   Dg Chest Port 1 View  Result Date: 10/21/2016 CLINICAL DATA:  Bilateral lower extremity swelling. EXAM: PORTABLE CHEST 1 VIEW COMPARISON:  09/18/2016. FINDINGS: Mediastinum and hilar structures normal. Cardiomegaly with mild bilateral  interstitial prominence and small bilateral pleural effusions findings consistent with congestive heart failure. Surgical clips right chest. IMPRESSION: Congestive heart failure with bilateral pulmonary interstitial edema and small bilateral pleural effusions similar findings noted on prior study of 09/18/2016. Electronically Signed   By: Marcello Moores  Register   On: 10/09/2016 15:22    Procedures Procedures (including critical care time)  Medications Ordered in ED Medications  furosemide (LASIX) injection 40 mg (not administered)   Initial vitals notable for no tachycardia, with sinus rhythm, rate 70s, unremarkable Pulse oximetry was 93% on room air, borderline   Initial Impression / Assessment and Plan / ED Course  I have reviewed the triage vital signs and the nursing notes.  Pertinent labs & imaging results that were available during my care of the patient were reviewed by me and considered in my medical decision making (see chart for details).  Update:, Patient's venous ultrasound was unremarkable.  Reviewed the patient's x-ray and CT. Findings concerning for worsening congestive heart failure, consistent with diminished breath sounds on exam, mild hypoxia, and the patient's history. Patient is on Lasix, 20 mg daily, will receive 40 mg here.  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  CT scan otherwise improved with less mass effect. I have paged to discuss the patient's case with  our neurosurgery colleagues.  Given the AMS and new fluid collection on CT w her Hx of recurrent CA, the patient was admitted for further E/M.  Final Clinical Impressions(s) / ED Diagnoses  Altered mental status.   Carmin Muskrat, MD 09/29/2016 [phone]

## 2016-10-22 NOTE — H&P (Signed)
[facility] History and Physical  Lekia Nier GBT:[id number] DOB: November 25, 1940 DOA: 10/21/2016  Referring physician:  PCP: Haywood Pao, MD  Specialists:   Chief Complaint: leg edema, weakness   HPI: Deanna Washington is a 76 y.o. 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 Per family: patient developed worsening leg edema, associated with generalized diffuse swelling for several days. She is also noted to have more weakness in her bl legs,  unable to ambulate well. She reports right eye blurry vision for few days. Per her daughter: she wanted her mother to go to rehab on the last admission but  was discharged home. Since the discharge she was progressive getting weak and deconditioned. She was seen at primary care office and started on lasix. She was seen by her neurosurgeon today and referred for ED evaluation. She presented today with worsening edema and weakness. She ash not started on chemo therapy or radiation treatment yet. She was planned for radiation next week and still waiting for chemo to be approved  -ED: patient is noted to have some fluid overload. hospitalist is called for admission. MRI showed acute stroke. I have discussed with neurology, Dr. Leonel Ramsay who recommended to [facility]  Review of Systems: The patient denies anorexia, fever, weight loss,, vision loss, decreased hearing, hoarseness, chest pain, syncope, dyspnea on exertion, peripheral edema, balance deficits, hemoptysis, abdominal pain, melena, hematochezia, severe indigestion/heartburn, hematuria, incontinence, genital sores, muscle weakness, suspicious skin lesions, transient blindness, difficulty walking, depression, unusual weight change, abnormal  bleeding, enlarged lymph nodes, angioedema, and breast masses.    Past Medical History:  Diagnosis Date  . Asthma   . 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 Breast cancer [facility])    believes it was in 2010  . HTN (hypertension)   . Metastatic cancer to lung [facility] dx'd 08/2016   Past Surgical History:  Procedure Laterality Date  . BREAST LUMPECTOMY     lumpectomy  . CRANIOTOMY Right 10/03/2016   Procedure: Right Occipital CRANIOTOMY TUMOR EXCISION with Curve;  Surgeon: Kary Kos, MD;  Location: [place];  Service: Neurosurgery;  Laterality: Right;   Social History:  reports that she has quit smoking. She has never used smokeless tobacco. She reports that she drinks about 1.2 oz of alcohol per week . She reports that she does not use drugs. Home;  where does patient live--home, ALF, SNF? and with whom if at home? No;  Can patient participate in ADLs?  No Known Allergies  Family History  Problem Relation Age of Onset  . Lung cancer Sister   . Esophageal cancer Brother   . Leukemia Maternal Aunt   . Lung cancer Brother   . Leukemia Brother   . Lung cancer Sister   . Breast cancer Neg Hx   . Colon cancer Neg Hx   . Pancreatic cancer Neg Hx     (be sure to complete)  Prior to Admission medications   Medication Sig Start Date End Date Taking? Authorizing Provider  ADVAIR DISKUS 500-50 MCG/DOSE AEPB Inhale 1 puff into the lungs 2 (two) times daily. 10/16/16  Yes Hayden Pedro, PA-C  amiodarone (PACERONE) 200 MG tablet Take 0.5 tablets (100 mg total) by mouth 2 (two) times daily. 10/21/16  Yes Oval Linsey,  Tiffany, MD  dexamethasone (DECADRON) 4 MG tablet Take 2 tablets (8 mg total) by mouth 2 (two) times daily with a meal. Patient taking differently: Take 4-8 mg by mouth 3 (three) times daily. Take 8mg  by mouth in the morning, 4mg  in the afternoon, and 8mg  at bedtime 09/24/16  Yes Short, Noah Delaine, MD  diphenhydrAMINE (BENADRYL) 25 mg capsule Take 25 mg by mouth at  bedtime as needed for sleep.    Yes [provider]  furosemide (LASIX) 20 MG tablet Take 1 tablet (20 mg total) by mouth daily. 10/18/16 01/16/17 Yes Skeet Latch, MD  HYDROcodone-acetaminophen (NORCO/VICODIN) 5-325 MG tablet Take 1 tablet by mouth every 4 (four) hours as needed for moderate pain. 10/07/16  Yes Meyran, Ocie Cornfield, NP  levETIRAcetam (KEPPRA) 500 MG tablet Take 1 tablet (500 mg total) by mouth 2 (two) times daily. 10/07/16  Yes Meyran, Ocie Cornfield, NP  LORazepam (ATIVAN) 0.5 MG tablet 1 tablet po 30 minutes prior to radiation or MRI 10/16/16  Yes Hayden Pedro, PA-C  nystatin (MYCOSTATIN) 100000 UNIT/ML suspension Take 5 mLs (500,000 Units total) by mouth 4 (four) times daily. 10/16/16  Yes Hayden Pedro, PA-C  ondansetron (ZOFRAN) 4 MG tablet Take 1 tablet (4 mg total) by mouth every 4 (four) hours as needed for nausea or vomiting. 10/07/16  Yes Meyran, Ocie Cornfield, NP  pantoprazole (PROTONIX) 40 MG tablet Take 1 tablet (40 mg total) by mouth daily. 09/24/16  Yes Short, Noah Delaine, MD  PROAIR HFA 108 [phone] Base) MCG/ACT inhaler Place 1-2 puffs into alternate nostrils every 4 (four) hours as needed for wheezing. 08/01/16  Yes [provider]  atorvastatin (LIPITOR) 20 MG tablet Take 1 tablet (20 mg total) by mouth daily. Patient not taking: Reported on 10/16/2016 09/23/16 09/23/17  Janece Canterbury, MD  lapatinib (TYKERB) 250 MG tablet Take 3 tablets (750 mg total) by mouth daily. Take on an empty stomach, 1 hour before or 2 hours after a meal 10/18/16   Magrinat, Virgie Dad, MD   Physical Exam: Vitals:   10/01/2016 1451  BP: (!) 152/92  Pulse: 70  Resp: 16  Temp: 97.6 F (36.4 C)  SpO2: 93%     General:  Alert. No distress.   Eyes: EOM-I  ENT: no oral ulcers   Neck: supple, nonJVD  Cardiovascular: s1,s2 rrr  Respiratory: CTA BL  Abdomen: soft, nt, nd   Skin: no rash   Musculoskeletal: no joint swelling   Psychiatric: no  hallucinations   Neurologic: right side hemianopsia. Lower quadrant. Right leg motor 3/5 (not acute per family)  Labs on Admission:  Basic Metabolic Panel:  Recent Labs Lab 10/16/16 0814 10/12/2016 1548  NA 135* 132*  K 3.9 4.0  CL  --  96*  CO2 22 26  GLUCOSE 138 127*  BUN 18.8 19  CREATININE 0.7 0.67  CALCIUM 8.6 8.6*   Liver Function Tests:  Recent Labs Lab 10/16/16 0814 10/07/2016 1548  AST 12 25  ALT 43 67*  ALKPHOS 104 94  BILITOT 0.50 0.6  PROT 5.7* 5.8*  ALBUMIN 2.8* 3.2*   No results for input(s): LIPASE, AMYLASE in the last 168 hours. No results for input(s): AMMONIA in the last 168 hours. CBC:  Recent Labs Lab 10/16/16 0814 10/06/2016 1548  WBC 16.8* 14.0*  NEUTROABS 15.4* 12.7*  HGB 14.7 15.5*  HCT 43.2 44.3  MCV 94.1 90.8  PLT 240 335   Cardiac Enzymes: No results for input(s): CKTOTAL, CKMB, CKMBINDEX, TROPONINI in the last  168 hours.  BNP (last 3 results)  Recent Labs  10/20/2016 1548  BNP 47.5    ProBNP (last 3 results) No results for input(s): PROBNP in the last 8760 hours.  CBG:  Recent Labs Lab 10/15/2016 1555  GLUCAP 128*    Radiological Exams on Admission: Ct Head Wo Contrast  Result Date: 10/16/2016 CLINICAL DATA:  Expressive aphasia, lethargy, difficulty standing, LEFT peripheral vision loss for 2 days. History of metastatic breast cancer on chemo radiation. Status post debulking of RIGHT occipital tumor. EXAM: CT HEAD WITHOUT CONTRAST TECHNIQUE: Contiguous axial images were obtained from the base of the skull through the vertex without intravenous contrast. COMPARISON:  MRI of the head October 04, 2016 and CT HEAD September 18, 2016 FINDINGS: BRAIN: RIGHT occipital resection cavity, re-expanded RIGHT occipital horn. No significant residual mass effect, surrounding low-density encephalomalacia. Moderate parenchymal brain volume loss for age. Patchy supratentorial white matter hypodensities, some of which reflect metastatic disease  characterized on prior MRI. Subcentimeter LEFT temporal lobe metastasis better seen on prior MRI. No acute large vascular territory infarct. No intraparenchymal hemorrhage. No extra-axial fluid collections. Basal cisterns are patent. VASCULAR: Mild to moderate calcific atherosclerosis of the carotid siphons. SKULL: Status post RIGHT parieto-occipital craniotomy. RIGHT calvarial suspected metastasis better characterized on prior MRI. RIGHT parieto-occipital scalp fluid collection measuring 6.2 x 1.4 cm. Borderline partially empty sella. SINUSES/ORBITS: Mild paranasal sinus mucosal thickening without air-fluid levels. Mastoid air cells are well aerated. The included ocular globes and orbital contents are non-suspicious. Status post bilateral ocular lens implants. OTHER: None. IMPRESSION: 1. Interval RIGHT craniotomy for debulking occipital lobe tumor, improved appearance with decreased mass effect and re-expanded RIGHT occipital horn. 2. 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 3. Moderate white matter changes consistent chronic small vessel ischemic disease and known metastasis. Electronically Signed   By: Elon Alas M.D.   On: 10/03/2016 16:47   Mr Brain Wo Contrast (neuro Protocol)  Result Date: 10/26/2016 CLINICAL DATA:  Altered level consciousness. History of metastatic breast cancer. Status post recent bulking of RIGHT occipital lobe metastasis. EXAM: MRI HEAD WITHOUT CONTRAST TECHNIQUE: Multiplanar, multiecho pulse sequences of the brain and surrounding structures were obtained without intravenous contrast. COMPARISON:  CT HEAD October 22, 2016 at 1612 hours and MRI of the head October 04, 2016 and August 23rd 2018 and FINDINGS: BRAIN: Multiple new subcentimeter foci of reduced diffusion RIGHT cerebellum with low ADC values. Stable subcentimeter reduced diffusion and splenium of corpus callosum and RIGHT medial parietal lobe without ADC  abnormality, possible mild nonenhancing tumor. Increasing fourth ventricle periventricular FLAIR T2 hyperintensities. Re- demonstration of LEFT cerebellar metastasis. Status post debulking of RIGHT occipital lobe tumor, with surrounding presumable gliosis and, potential residual tumor without mass effect. Susceptibility artifact within and along the margin of the resection cavity. No midline shift. Re-expanded RIGHT occipital horn and RIGHT atrium. No hydrocephalus. Similar patchy to confluent supratentorial white matter FLAIR T2 hyperintensities. LEFT parietal cortical T2 hyperintensity corresponding to known metastasis. No abnormal extra-axial fluid collections. Focal dural thickening subjacent to craniotomy. VASCULAR: Normal major intracranial vascular flow voids present at skull base. SKULL AND UPPER CERVICAL SPINE: Scattered mildly expansile T2 bright foci within RIGHT calvarial diploic space concerning for metastatic disease. Status post RIGHT parieto-occipital craniotomy. Partially empty sella. The Craniocervical junction maintained. SINUSES/ORBITS: Trace mastoid effusions. Mild paranasal sinus mucosal thickening without air-fluid levels. The included ocular globes and orbital contents are non-suspicious. Status post bilateral ocular lens implants. OTHER: Larger RIGHT  parietoccipital T2 bright fluid collection with susceptibility artifact overlying craniotomy, predominately of loosening signal on FLAIR sequence. IMPRESSION: 1. Acute subcentimeter RIGHT cerebellar infarcts, less likely new metastasis. 2. Interval debulking of RIGHT occipital tumor with improved appearance, residual edema and potential tumor ; limited assessment without contrast. No residual mass effect or midline shift. 3. 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 4. Additional intracranial metastasis better characterized on prior MRI. 5. Moderate chronic small vessel ischemic disease. Electronically Signed    By: Elon Alas M.D.   On: 10/21/2016 17:27   Dg Chest Port 1 View  Result Date: 09/29/2016 CLINICAL DATA:  Bilateral lower extremity swelling. EXAM: PORTABLE CHEST 1 VIEW COMPARISON:  09/18/2016. FINDINGS: Mediastinum and hilar structures normal. Cardiomegaly with mild bilateral interstitial prominence and small bilateral pleural effusions findings consistent with congestive heart failure. Surgical clips right chest. IMPRESSION: Congestive heart failure with bilateral pulmonary interstitial edema and small bilateral pleural effusions similar findings noted on prior study of 09/18/2016. Electronically Signed   By: Marcello Moores  Register   On: 10/19/2016 15:22    EKG: Independently reviewed.   Assessment/Plan Active Problems:   Brain tumor ([place])   Asthma   Acute embolic stroke [facility])   Atrial fibrillation, new onset ([place])   76 y.o. 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 Found to have acute stroke   Suspected acute stroke. MRI showed: Acute subcentimeter RIGHT cerebellar infarcts, less likely new metastasis. patient with h/o a fib not on anticoagulation due to bleeding risk. Patient is not a candidate for TPA (recent craniotomy, brain mets) -will obtain stroke work up. Recent echo: LVEF 60-65%, No cardiac source of emboli was indentified. Will check carotid US. Monitor on tele. Patient may not be a good candidate for anticoagulation/antiplatelets due to metastatic cancer.  D/w neurology, Dr. Leonel Ramsay for consult. Will f/u neurology eval regarding antiplatelet or anticoagulation regimen, imaging, may need contrast MRI.   Elevated Blood pressure. Will allow mild permissive HTN with acjute stroke. Prn labetalol if needed  BL leg edema, generalized mild  diffuse edema. Patient reports right leg edema >40 years, and left leg edema for few years. Suspect lymphedema.  -no s/s of acute respiratory distress on exam. will not diuresis today due to acute stroke. Will start diuresis in 24-48 hrs as needed  Leukocytosis  likely due to steroids. No s/s of acute infection. Afebrile. Monitor  Hyponatremia likely due to free water retention due to steroids vs mild siadh. 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 Possible to start next week    Neurology.  if consultant consulted, please document name and whether formally or informally consulted  Code Status: DNR. D/w patient, her family.  (must indicate code status--if unknown or must be presumed, indicate so) Family Communication: d/w patient, her family.  (indicate person spoken with, if applicable, with phone number if by telephone) Disposition Plan: pend clinical improvement. PT (indicate anticipated LOS)  Time spent: >45 minutes   Kinnie Feil [facility] Pager [phone]  If 7PM-7AM, please contact night-coverage [email or web address] Password [facility] 10/21/2016, 5:34 PM

## 2016-10-22 NOTE — ED Triage Notes (Signed)
Pt recently had brain surgery on 9/6 and has complaints of swelling in bilateral lower extremities and at the surgical site. Pt daughter states pt has been lethargic and had increased SOB. Pt AOx4.  Daughter at bedside providing information.

## 2016-10-22 NOTE — Progress Notes (Signed)
Pt admitted from [facility] with diagnosis of stroke, alert and oriented but a bit forgetful, denies any pain, pt settled in bed with call light at bedside, tele monitor put and verified on pt, was however reassured and will continue to monitor, v/s stable. Obasogie-Asidi, Trinidy Masterson Efe

## 2016-10-22 NOTE — Telephone Encounter (Signed)
Returned call to patient's daughter Hilda Blades.She wanted Dr. to know she is taking mother to [facility] oncologist will be admitting her.She is sob,increase swelling in lower legs,fast heart beat.

## 2016-10-22 NOTE — Telephone Encounter (Signed)
New message    Pt daughter Hilda Blades calling and letting us know that pt is going to [facility].

## 2016-10-22 NOTE — Progress Notes (Signed)
**  Preliminary report by tech**  Bilateral lower extremity venous duplex completed. There is no evidence of deep or superficial vein thrombosis involving the right and left lower extremities. All visualized vessels appear patent and compressible. There is no evidence of Baker's cysts bilaterally. Results were given to Dr. Vanita Panda.  10/21/2016 3:57 PM Deanna Washington RVT

## 2016-10-22 NOTE — ED Provider Notes (Signed)
  Physical Exam  BP (!) 152/92 (BP Location: Right Arm)   Pulse 70   Temp 97.6 F (36.4 C) (Oral)   Resp 16   SpO2 93%   Physical Exam  ED Course  Procedures MDM Dr. Saintclair Halsted called from neurosurgery. He states that patient has known mets and pseudomeningoceal and doesn't qualify to get surgery right now. CXR showed pulmonary edema. Hospitalist to admit.      [name] Freeze, MD 10/20/2016 [phone]

## 2016-10-23 ENCOUNTER — Encounter (HOSPITAL_COMMUNITY): Payer: Self-pay | Admitting: *Deleted

## 2016-10-23 ENCOUNTER — Inpatient Hospital Stay (HOSPITAL_COMMUNITY): Payer: Medicare Other

## 2016-10-23 DIAGNOSIS — R609 Edema, unspecified: Secondary | ICD-10-CM

## 2016-10-23 DIAGNOSIS — J4530 Mild persistent asthma, uncomplicated: Secondary | ICD-10-CM

## 2016-10-23 LAB — HEMOGLOBIN A1C
Hgb A1c MFr Bld: 5.9 % — ABNORMAL HIGH (ref 4.8–5.6)
Mean Plasma Glucose: 122.63 mg/dL

## 2016-10-23 LAB — LIPID PANEL
Cholesterol: 205 mg/dL — ABNORMAL HIGH (ref 0–200)
HDL: 72 mg/dL (ref >40)
LDL CALC: 115 mg/dL — AB (ref 0–99)
TRIGLYCERIDES: 89 mg/dL (ref <150)
Total CHOL/HDL Ratio: 2.8 RATIO
VLDL: 18 mg/dL (ref 0–40)

## 2016-10-23 LAB — OSMOLALITY, URINE: OSMOLALITY UR: 612 mosm/kg (ref 300–900)

## 2016-10-23 LAB — BRAIN NATRIURETIC PEPTIDE: B NATRIURETIC PEPTIDE 5: 53 pg/mL (ref 0.0–100.0)

## 2016-10-23 MED ORDER — ACETAZOLAMIDE 250 MG PO TABS
250.0000 mg | ORAL_TABLET | Freq: Two times a day (BID) | ORAL | Status: DC
  Administered 2016-10-23 – 2016-10-26 (×6): 250 mg via ORAL
  Filled 2016-10-23 (×10): qty 1

## 2016-10-23 MED ORDER — ATORVASTATIN CALCIUM 40 MG PO TABS
40.0000 mg | ORAL_TABLET | Freq: Every day | ORAL | Status: DC
  Administered 2016-10-24 – 2016-10-26 (×3): 40 mg via ORAL
  Filled 2016-10-23 (×3): qty 1

## 2016-10-23 MED ORDER — IOPAMIDOL (ISOVUE-370) INJECTION 76%
INTRAVENOUS | Status: AC
  Administered 2016-10-23: 100 mL
  Filled 2016-10-23: qty 100

## 2016-10-23 MED ORDER — FUROSEMIDE 10 MG/ML IJ SOLN
40.0000 mg | Freq: Every day | INTRAMUSCULAR | Status: DC
  Administered 2016-10-24: 40 mg via INTRAVENOUS
  Filled 2016-10-23: qty 4

## 2016-10-23 MED ORDER — FUROSEMIDE 10 MG/ML IJ SOLN: 20.0000 mg | Freq: Two times a day (BID) | INTRAMUSCULAR | Status: DC

## 2016-10-23 NOTE — Consult Note (Signed)
Requesting Physician: Dr. Kathryne Eriksson    Chief Complaint: Stroke   History obtained from:   Patient and Chart     HPI:                                                                                                                                       Deanna Washington is an 76 y.o. female Caucasian right-handed with past medical history of HTN, paroxysmal atrial fibrillation,chronic lower extremity edema, asthma, stage IV metastatic breast cancer status post craniotomy resection of right occipital brain metastatic lesion on 10/03/2016 was admitted to the hospital for worsening bilateral leg edema. She saw her neurosurgeon today and was sent to the emergency room. 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 While these are likely to be incidental, neurology was consulted for further evaluation.  Prior Cancer history and workup  The patient was diagnosed  breast cancer 8 years ago  status post lumpectomy and radiation and had been in remission presented in August  with progressive headache, nausea and vomiting. MRI showed  a 6 x 5 x 6.4 cm heterogeneous enhancing mass in the posterior right cerebrum spinning but the parietal and occipital lobes with scattered cystic spaces and blood products. There is local mass effect and mild vasogenic edema. There are also 2 additional masses a 10 mm left cerebellar mass and a 3 mm left parietal cortex mass suggestive of metastatic disease. In addition, she had punctate areas of cortical diffusion hyperintensity in the left occipital lobe consistent with small acute infarcts with a subacute ischemia in the upper right cerebellum. New diagnosis of atrial fibrillation was detected during hospitalization or anticoagulation due to possible brain surgery.the patient was discharged with Decadron and Keppra. Subsequently the patient  underwent craniotomy with biopsy on 10/03/2016. Her plan is to undergo radiation with Dr. Lisbeth Renshaw and start on Lapatinib.       Past Medical History:  Diagnosis Date  . Asthma   . 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 Breast cancer [facility])    believes it was in 2010  . HTN (hypertension)   . 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 BREAST LUMPECTOMY     lumpectomy  . CRANIOTOMY Right 10/03/2016   Procedure: Right Occipital CRANIOTOMY TUMOR EXCISION with Curve;  Surgeon: Kary Kos, MD;  Location: [place];  Service: Neurosurgery;  Laterality: Right;    Family History  Problem Relation Age of Onset  . Lung cancer Sister   . Esophageal cancer Brother   . Leukemia Maternal Aunt   . Lung cancer Brother   . Leukemia Brother   . Lung cancer Sister   . Breast cancer Neg Hx   .  Colon cancer Neg Hx   . Pancreatic cancer Neg Hx    Social History:  reports that she has quit smoking. She has never used smokeless tobacco. She reports that she drinks about 1.2 oz of alcohol per week . She reports that she does not use drugs.  Allergies: No Known Allergies  Medications:                                                                                                                           Reviewed   ROS:                                                                                                                                         General ROS: negative for - chills, fatigue, fever, night sweats, weight gain or weight loss Psychological ROS: negative for - behavioral disorder, hallucinations, memory difficulties, mood swings or suicidal ideation Ophthalmic ROS: negative for - blurry vision, double vision, eye pain or loss of vision ENT ROS: negative for - epistaxis, nasal discharge, oral lesions, sore throat, tinnitus or vertigo Allergy and Immunology ROS: negative for - hives or itchy/watery  eyes Hematological and Lymphatic ROS: negative for - bleeding problems, bruising or swollen lymph nodes Endocrine ROS: negative for - galactorrhea, hair pattern changes, polydipsia/polyuria or temperature intolerance Respiratory ROS: negative for - cough, hemoptysis, shortness of breath or wheezing Cardiovascular ROS: negative for - chest pain, dyspnea on exertion, edema or irregular heartbeat Gastrointestinal ROS: negative for - abdominal pain, diarrhea, hematemesis, nausea/vomiting or stool incontinence Genito-Urinary ROS: negative for - dysuria, hematuria, incontinence or urinary frequency/urgency Musculoskeletal ROS: negative for - joint swelling or muscular weakness Neurological ROS: as noted in HPI Dermatological ROS: negative for rash and skin lesion changes   Examination:                                                                                                      General: Appears well-developed  and well-nourished.  Psych: Affect appropriate to situation Eyes: No scleral injection HENT: No OP obstrucion Head: Normocephalic.  Cardiovascular: Normal rate and regular rhythm.  Respiratory: Effort normal and breath sounds normal to anterior ascultation GI: Soft.  No distension. There is no tenderness.  Skin: WDI Ext: bilateral pitting edema in both legs    Neurological Examination Mental Status: Alert, oriented, thought content appropriate.  Speech fluent without evidence of aphasia.  Able to follow 3 step commands without difficulty. Cranial Nerves: II:  Visual fields grossly normal,  III,IV, VI: ptosis not present, extra-ocular motions intact bilaterally, pupils equal, round, reactive to light and accommodation V,VII: smile symmetric, facial light touch sensation normal bilaterally VIII: hearing normal bilaterally IX,X: uvula rises symmetrically XI: bilateral shoulder shrug XII: midline tongue extension Motor: Right : Upper extremity   5/5    Left:     Upper  extremity   5/5  Lower extremity   3+/5    Lower extremity   4/5 Tone and bulk:normal tone throughout; no atrophy noted Sensory: Pinprick and light touch intact throughout, bilaterally Deep Tendon Reflexes: Bilateral upper biceps/triceps refelxes 2+ Plantars: did not assess due to severe edema Cerebellar: normal finger-to-nose, normal rapid alternating movements and normal heel-to-shin test Gait: normal gait and station     Lab Results: Basic Metabolic Panel:  Recent Labs Lab 10/16/16 0814 10/05/2016 1548  NA 135* 132*  K 3.9 4.0  CL  --  96*  CO2 22 26  GLUCOSE 138 127*  BUN 18.8 19  CREATININE 0.7 0.67  CALCIUM 8.6 8.6*    CBC:  Recent Labs Lab 10/16/16 0814 10/07/2016 1548  WBC 16.8* 14.0*  NEUTROABS 15.4* 12.7*  HGB 14.7 15.5*  HCT 43.2 44.3  MCV 94.1 90.8  PLT 240 335    Coagulation Studies: No results for input(s): LABPROT, INR in the last 72 hours.  Imaging: Ct Head Wo Contrast  Result Date: 10/17/2016 CLINICAL DATA:  Expressive aphasia, lethargy, difficulty standing, LEFT peripheral vision loss for 2 days. History of metastatic breast cancer on chemo radiation. Status post debulking of RIGHT occipital tumor. EXAM: CT HEAD WITHOUT CONTRAST TECHNIQUE: Contiguous axial images were obtained from the base of the skull through the vertex without intravenous contrast. COMPARISON:  MRI of the head October 04, 2016 and CT HEAD September 18, 2016 FINDINGS: BRAIN: RIGHT occipital resection cavity, re-expanded RIGHT occipital horn. No significant residual mass effect, surrounding low-density encephalomalacia. Moderate parenchymal brain volume loss for age. Patchy supratentorial white matter hypodensities, some of which reflect metastatic disease characterized on prior MRI. Subcentimeter LEFT temporal lobe metastasis better seen on prior MRI. No acute large vascular territory infarct. No intraparenchymal hemorrhage. No extra-axial fluid collections. Basal cisterns are patent.  VASCULAR: Mild to moderate calcific atherosclerosis of the carotid siphons. SKULL: Status post RIGHT parieto-occipital craniotomy. RIGHT calvarial suspected metastasis better characterized on prior MRI. RIGHT parieto-occipital scalp fluid collection measuring 6.2 x 1.4 cm. Borderline partially empty sella. SINUSES/ORBITS: Mild paranasal sinus mucosal thickening without air-fluid levels. Mastoid air cells are well aerated. The included ocular globes and orbital contents are non-suspicious. Status post bilateral ocular lens implants. OTHER: None. IMPRESSION: 1. Interval RIGHT craniotomy for debulking occipital lobe tumor, improved appearance with decreased mass effect and re-expanded RIGHT occipital horn. 2. 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 3. Moderate white matter changes consistent chronic small vessel ischemic disease and known metastasis. Electronically Signed   By: Elon Alas M.D.   On: 10/09/2016  16:47   Mr Brain Wo Contrast (neuro Protocol)  Result Date: 10/01/2016 CLINICAL DATA:  Altered level consciousness. History of metastatic breast cancer. Status post recent bulking of RIGHT occipital lobe metastasis. EXAM: MRI HEAD WITHOUT CONTRAST TECHNIQUE: Multiplanar, multiecho pulse sequences of the brain and surrounding structures were obtained without intravenous contrast. COMPARISON:  CT HEAD October 22, 2016 at 1612 hours and MRI of the head October 04, 2016 and August 23rd 2018 and FINDINGS: BRAIN: Multiple new subcentimeter foci of reduced diffusion RIGHT cerebellum with low ADC values. Stable subcentimeter reduced diffusion and splenium of corpus callosum and RIGHT medial parietal lobe without ADC abnormality, possible mild nonenhancing tumor. Increasing fourth ventricle periventricular FLAIR T2 hyperintensities. Re- demonstration of LEFT cerebellar metastasis. Status post debulking of RIGHT occipital lobe tumor, with  surrounding presumable gliosis and, potential residual tumor without mass effect. Susceptibility artifact within and along the margin of the resection cavity. No midline shift. Re-expanded RIGHT occipital horn and RIGHT atrium. No hydrocephalus. Similar patchy to confluent supratentorial white matter FLAIR T2 hyperintensities. LEFT parietal cortical T2 hyperintensity corresponding to known metastasis. No abnormal extra-axial fluid collections. Focal dural thickening subjacent to craniotomy. VASCULAR: Normal major intracranial vascular flow voids present at skull base. SKULL AND UPPER CERVICAL SPINE: Scattered mildly expansile T2 bright foci within RIGHT calvarial diploic space concerning for metastatic disease. Status post RIGHT parieto-occipital craniotomy. Partially empty sella. The Craniocervical junction maintained. SINUSES/ORBITS: Trace mastoid effusions. Mild paranasal sinus mucosal thickening without air-fluid levels. The included ocular globes and orbital contents are non-suspicious. Status post bilateral ocular lens implants. OTHER: Larger RIGHT parietoccipital T2 bright fluid collection with susceptibility artifact overlying craniotomy, predominately of loosening signal on FLAIR sequence. IMPRESSION: 1. Acute subcentimeter RIGHT cerebellar infarcts, less likely new metastasis. 2. Interval debulking of RIGHT occipital tumor with improved appearance, residual edema and potential tumor ; limited assessment without contrast. No residual mass effect or midline shift. 3. 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 4. Additional intracranial metastasis better characterized on prior MRI. 5. Moderate chronic small vessel ischemic disease. Electronically Signed   By: Elon Alas M.D.   On: 10/10/2016 17:27   Dg Chest Port 1 View  Result Date: 10/25/2016 CLINICAL DATA:  Bilateral lower extremity swelling. EXAM: PORTABLE CHEST 1 VIEW COMPARISON:  09/18/2016. FINDINGS:  Mediastinum and hilar structures normal. Cardiomegaly with mild bilateral interstitial prominence and small bilateral pleural effusions findings consistent with congestive heart failure. Surgical clips right chest. IMPRESSION: Congestive heart failure with bilateral pulmonary interstitial edema and small bilateral pleural effusions similar findings noted on prior study of 09/18/2016. Electronically Signed   By: Marcello Moores  Register   On: 10/10/2016 15:22     ASSESSMENT AND PLAN  76 y.o. female Caucasian right-handed with past medical history of HTN, paroxysmal atrial fibrillation,chronic lower extremity edema, asthma, stage IV metastatic breast cancer status post craniotomy resection of right occipital brain metastatic lesion on 10/03/2016 with prior subacute infarcts on MRI Brain on 8/23 and now incidental acute right cerebellar infarcts on MRI brain.Etiology of her stroke is likely paroxysmal atrial fibrillation versus hypercoagulable state from malignancy. LE dopplers negative for DVT.   Plan:  Anticoagulation when cleared by neurosurgery given recent craniotomy. Lovenox maybe considered as the agent of choice when ready if agreeable by Oncology and Neurosurgery given history of cancer. Carotid Doppler Echocardiogram  Continue Atorvastatin  Bp goal <  140/90 AIC and Lipid profile     Adel Neyer [facility] Pager Number [phone]

## 2016-10-23 NOTE — Progress Notes (Signed)
Patient ID: [name], female   DOB: Dec 22, 1940, 76 y.o.   MRN: [id number] Patient well known to me saw her in the office yesterday for follow-up of fatigue. Patient was noted be shortness of breath or significant lower extremity edema. We sent the patient emerged from patient was seen in the emergency was diagnosed with forward pulmonary edema was admitted to the hospital and also subsequent diagnosed with new CVAs. Neurologically patient seems to be doing okay except forg eneral lethargy she does have a small cementing seal underneath her flap from her previous craniotomy site however this is not under pressure and does not n intervened on at this point.due to the newly diagnosed recent strokesI do think it's indicated to go ahead and anticoagulate her. She is certainly at a moderately high risk of hemorrhagic complication due to recent surgery as well as her previous history of hemorrhage into these metastases. In addition I think we need to aggressively treat a pulmonary edema and increase her diuresis. Patient is scheduled for radiation treatment on Monday however this may have to be deferred based on patient's clinical status and recovery.

## 2016-10-23 NOTE — Evaluation (Signed)
Speech Language Pathology Evaluation Patient Details Name: Deanna Washington MRN: [id number] DOB: 1940-04-08 Today's Date: 10/23/2016 Time: 1440-1500 SLP Time Calculation (min) (ACUTE ONLY): 20 min  Problem List:  Patient Active Problem List   Diagnosis Date Noted  . Status post craniotomy 10/03/2016  . Metastasis to brain ([place]) 10/03/2016  . Brain tumor ([place]) 09/19/2016  . Asthma 09/19/2016  . Essential hypertension 09/19/2016  . Acute embolic stroke ([place]) 17/40/8144  . Atrial fibrillation, new onset ([place]) 09/19/2016  . Sinus bradycardia 09/19/2016  . Metastatic cancer ([place]) 09/19/2016  . Bradycardia   . Tachy-brady syndrome [facility])    Past Medical History:  Past Medical History:  Diagnosis Date  . Asthma   . 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 Breast cancer [facility])    believes it was in 2010  . HTN (hypertension)   . 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 BREAST LUMPECTOMY     lumpectomy  . CRANIOTOMY Right 10/03/2016   Procedure: Right Occipital CRANIOTOMY TUMOR EXCISION with Curve;  Surgeon: Kary Kos, MD;  Location: [place];  Service: Neurosurgery;  Laterality: Right;   HPI:  Pt is a 76 y.o. female Caucasian right-handed with past medical history of HTN, paroxysmal atrial fibrillation,chronic lower extremity edema, asthma, stage IV metastatic breast cancer status post craniotomy resection of right occipital brain metastatic lesion on 10/03/2016 was admitted to the hospital for worsening bilateral leg edema. MRI results show acute subcentimeter R cerebrallar infarcts.   Assessment / Plan / Recommendation Clinical Impression   Pt presents with moderate cognitive-linguistic impairments.  Pt's functional communication is limited by mild word finding deficits.  Pt has good awareness of these deficits and can correct them in most instances with min assist and increased time for  processing.  Pt also demonstrates decreased selective attention to tasks, decreased recall of new information, and decreased functional problem solving.  Pt has less awareness of these limitations but is able to state that she wouldn't be able to resume care of her husband at this point.  Pt would benefit from skilled ST while inpatient and would be a good candidate for CIR for further therapies to maximize functional independence and reduce burden of care prior to discharge.      SLP Assessment  SLP Recommendation/Assessment: Patient needs continued Speech Lanaguage Pathology Services SLP Visit Diagnosis: Cognitive communication deficit (R41.841)    Follow Up Recommendations  Inpatient Rehab    Frequency and Duration min 2x/week         SLP Evaluation Cognition  Overall Cognitive Status: Impaired/Different from baseline Arousal/Alertness: Awake/alert Orientation Level: Oriented X4 Attention: Selective Selective Attention: Impaired Selective Attention Impairment: Functional basic Memory: Impaired Memory Impairment: Decreased recall of new information Awareness: Impaired Awareness Impairment: Intellectual impairment Problem Solving: Impaired Problem Solving Impairment: Functional basic       Comprehension  Auditory Comprehension Overall Auditory Comprehension: Appears within functional limits for tasks assessed    Expression Expression Primary Mode of Expression: Verbal Verbal Expression Overall Verbal Expression: Impaired Naming: Impairment Responsive: 76-100% accurate Confrontation: Impaired Convergent: 75-100% accurate Divergent: 50-74% accurate Written Expression Dominant Hand: Right   Oral / Motor  Oral Motor/Sensory Function Overall Oral Motor/Sensory Function: Within functional limits Motor Speech Overall Motor Speech: Appears within functional limits for tasks assessed   GO  Eain Mullendore, Selinda Orion 10/23/2016, 3:07 PM

## 2016-10-23 NOTE — Progress Notes (Signed)
Pt observed to be unable to void, bladder scan done, greater than 910ml recorded, on call person NP [employer] paged and notified, ordered a time in and out cath procedure, same done at 2130, 984ml of clear urine drained out, peri care done and pt made comfortable in bed, will however continue to monitor. Deanna Washington, Deanna Washington

## 2016-10-23 NOTE — Progress Notes (Addendum)
[facility]                                                                              Patient Demographics  Deanna Washington, is a 76 y.o. female, DOB - Sep 20, 1940, [id number]  Admit date - 10/15/2016   Admitting Physician Kinnie Feil, MD  Outpatient Primary MD for the patient is Tisovec, Fransico Him, MD  Outpatient specialists:   LOS - 1  days   Medical records reviewed and are as summarized below:    Chief Complaint  Patient presents with  . [facility]  . Lethargic  . Leg Swelling       Brief summary   Deanna Washington is a 76 y.o. 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 Per family: patient developed worsening leg edema, associated with generalized diffuse swelling for several days. She is also noted to have more weakness in her bl legs,  unable to ambulate well. She reported right eye blurry vision for few days. MRI showed acute stroke   Assessment & Plan    Principal Problem:   Acute embolic stroke [facility]): Presented with worsening bilateral weakness in the legs, the setting of recent craniotomy, paroxysmal atrial fibrillation not on anticoagulation - MRI of the brain showed acute subcentimeter right cerebellar infarcts, less likely new metastasis - history of atrial fibrillation not on anticoagulation due to bleeding risk - discussed with neurology, recommended neurosurgery consult for anticoagulation, if can be cleared at this point given recent craniotomy as etiology of her stroke is likely paroxysmal atrial fibrillation versus hypercoagulable state from malignancy - carotid Dopplers 8/23 showed bilateral 1-39% ICA stenosis - 2-D echo showed EF of 60-65% with grade 1 diastolic  dysfunction Addendum: 1:30pm Discussed with neurosurgery, Dr. Saintclair Halsted, cleared to be started on anticoagulation as she is 2 weeks out of the surgery. He recommended if patient is placed on heparin, not to give heparin bolus. Otherwise, NOACs all equivocal. Patient remains at high risk of intracranial hemorrhage due to recent surgery and metastasis. Relayed all the above to neurology, Dr Erlinda Hong.   Active Problems:   Metastasis to brain, status post craniotomy for resection of right occipital brain metastasis on 9/6 , seizures - discussed with neurology, recommended neurosurgery consult for anticoagulationif can be cleared at this point given recent craniotomy as etiology of her stroke is likely paroxysmal atrial fibrillation versus hypercoagulable state from malignancy - neurosurgery consulted  - continue dexamethasone, Keppra  bilateral lower extremity edema, Acute on chronic diastolic CHF, dyspnea  - Doppler ultrasound of the lower extremities negative for DVT - patient reported right leg edema for more than 40 years, left leg edema for a few years, possibly lymphedema - placed on Lasix 40mg  IV q24hrs, strict I's and O's     Asthma - currently stable, no wheezing     Atrial fibrillation, paroxysmal - currently rate controlled, on amiodarone, continue - currently not on any anticoagulation, neurosurgery  consulted for clearance  Code Status: DNR  DVT Prophylaxis:  SCD's Family Communication: Discussed in detail with the patient, all imaging results, lab results explained to the patient   Disposition Plan: Will likely need skilled nursing facility  Time Spent in minutes  25 minutes  Procedures:    Consultants:   Neurology neurosurgery  Antimicrobials:      Medications  Scheduled Meds: . amiodarone  100 mg Oral BID  . atorvastatin  20 mg Oral Daily  . dexamethasone  4 mg Oral Q1400  . dexamethasone  8 mg Oral BID  . levETIRAcetam  500 mg Oral BID  . mometasone-formoterol  2  puff Inhalation BID  . pantoprazole  40 mg Oral Daily   Continuous Infusions: PRN Meds:.acetaminophen **OR** acetaminophen (TYLENOL) oral liquid 160 mg/5 mL **OR** acetaminophen, albuterol, hydrALAZINE, LORazepam   Antibiotics   Anti-infectives    None        Subjective:   Deanna Washington was seen and examined today.  Still feeling weak, no headache no blurry vision. Patient denies dizziness, chest pain, shortness of breath, abdominal pain, N/V/D.  No acute events overnight.    Objective:   Vitals:   10/23/16 0600 10/23/16 0924 10/23/16 0945 10/23/16 1018  BP: (!) 155/86 (!) 144/82  (!) 144/82  Pulse: 73 80 93 94  Resp: 18 18 18 17   Temp:  98.1 F (36.7 C)  98.1 F (36.7 C)  TempSrc:  Oral  Oral  SpO2: 90% 94% 95% 96%  Weight:      Height:        Intake/Output Summary (Last 24 hours) at 10/23/16 1128 Last data filed at 10/23/16 0646  Gross per 24 hour  Intake              120 ml  Output              550 ml  Net             -430 ml     Wt Readings from Last 3 Encounters:  10/26/2016 72.8 kg (160 lb 8 oz)  10/19/2016 77.6 kg (171 lb)  10/03/16 77.6 kg (171 lb)     Exam  General: Alert and oriented x 3, NAD  Eyes:   HEENT:  Atraumatic, normocephalic, normal oropharynx  Cardiovascular: S1 S2 auscultated, no rubs, murmurs or gallops. Regular rate and rhythm.  Respiratory: Clear to auscultation bilaterally, no wheezing, rales or rhonchi  Gastrointestinal: Soft, nontender, nondistended, + bowel sounds  Ext: b/l pedal edema bilaterally  Neuro: right lower ext 3/5, LLE 4/5, RUE, LUE 5/5  Musculoskeletal: No digital cyanosis, clubbing  Skin: No rashes  Psych: Normal affect and demeanor, alert and oriented x3    Data Reviewed:  I have personally reviewed following labs and imaging studies  Micro Results No results found for this or any previous visit (from the past 240 hour(s)).  Radiology Reports Ct Head Wo Contrast  Result Date:  09/29/2016 CLINICAL DATA:  Expressive aphasia, lethargy, difficulty standing, LEFT peripheral vision loss for 2 days. History of metastatic breast cancer on chemo radiation. Status post debulking of RIGHT occipital tumor. EXAM: CT HEAD WITHOUT CONTRAST TECHNIQUE: Contiguous axial images were obtained from the base of the skull through the vertex without intravenous contrast. COMPARISON:  MRI of the head October 04, 2016 and CT HEAD September 18, 2016 FINDINGS: BRAIN: RIGHT occipital resection cavity, re-expanded RIGHT occipital horn. No significant residual mass effect, surrounding low-density encephalomalacia. Moderate parenchymal brain volume loss for  age. Patchy supratentorial white matter hypodensities, some of which reflect metastatic disease characterized on prior MRI. Subcentimeter LEFT temporal lobe metastasis better seen on prior MRI. No acute large vascular territory infarct. No intraparenchymal hemorrhage. No extra-axial fluid collections. Basal cisterns are patent. VASCULAR: Mild to moderate calcific atherosclerosis of the carotid siphons. SKULL: Status post RIGHT parieto-occipital craniotomy. RIGHT calvarial suspected metastasis better characterized on prior MRI. RIGHT parieto-occipital scalp fluid collection measuring 6.2 x 1.4 cm. Borderline partially empty sella. SINUSES/ORBITS: Mild paranasal sinus mucosal thickening without air-fluid levels. Mastoid air cells are well aerated. The included ocular globes and orbital contents are non-suspicious. Status post bilateral ocular lens implants. OTHER: None. IMPRESSION: 1. Interval RIGHT craniotomy for debulking occipital lobe tumor, improved appearance with decreased mass effect and re-expanded RIGHT occipital horn. 2. 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 3. Moderate white matter changes consistent chronic small vessel ischemic disease and known metastasis. Electronically Signed   By: Elon Alas M.D.   On: 10/13/2016 16:47   Mr Brain Wo Contrast (neuro Protocol)  Result Date: 10/19/2016 CLINICAL DATA:  Altered level consciousness. History of metastatic breast cancer. Status post recent bulking of RIGHT occipital lobe metastasis. EXAM: MRI HEAD WITHOUT CONTRAST TECHNIQUE: Multiplanar, multiecho pulse sequences of the brain and surrounding structures were obtained without intravenous contrast. COMPARISON:  CT HEAD October 22, 2016 at 1612 hours and MRI of the head October 04, 2016 and August 23rd 2018 and FINDINGS: BRAIN: Multiple new subcentimeter foci of reduced diffusion RIGHT cerebellum with low ADC values. Stable subcentimeter reduced diffusion and splenium of corpus callosum and RIGHT medial parietal lobe without ADC abnormality, possible mild nonenhancing tumor. Increasing fourth ventricle periventricular FLAIR T2 hyperintensities. Re- demonstration of LEFT cerebellar metastasis. Status post debulking of RIGHT occipital lobe tumor, with surrounding presumable gliosis and, potential residual tumor without mass effect. Susceptibility artifact within and along the margin of the resection cavity. No midline shift. Re-expanded RIGHT occipital horn and RIGHT atrium. No hydrocephalus. Similar patchy to confluent supratentorial white matter FLAIR T2 hyperintensities. LEFT parietal cortical T2 hyperintensity corresponding to known metastasis. No abnormal extra-axial fluid collections. Focal dural thickening subjacent to craniotomy. VASCULAR: Normal major intracranial vascular flow voids present at skull base. SKULL AND UPPER CERVICAL SPINE: Scattered mildly expansile T2 bright foci within RIGHT calvarial diploic space concerning for metastatic disease. Status post RIGHT parieto-occipital craniotomy. Partially empty sella. The Craniocervical junction maintained. SINUSES/ORBITS: Trace mastoid effusions. Mild paranasal sinus mucosal thickening without air-fluid levels. The included ocular globes  and orbital contents are non-suspicious. Status post bilateral ocular lens implants. OTHER: Larger RIGHT parietoccipital T2 bright fluid collection with susceptibility artifact overlying craniotomy, predominately of loosening signal on FLAIR sequence. IMPRESSION: 1. Acute subcentimeter RIGHT cerebellar infarcts, less likely new metastasis. 2. Interval debulking of RIGHT occipital tumor with improved appearance, residual edema and potential tumor ; limited assessment without contrast. No residual mass effect or midline shift. 3. 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 4. Additional intracranial metastasis better characterized on prior MRI. 5. Moderate chronic small vessel ischemic disease. Electronically Signed   By: Elon Alas M.D.   On: 10/07/2016 17:27   Mr Jeri Cos PP Contrast  Result Date: 10/04/2016 CLINICAL DATA:  76 year old female postoperative day 1 stereotactic subtotal resection of a large right occipital brain tumor. Preliminary pathology suggesting breast cancer metastasis. Intraoperative evidence of tumor extension to the trigone of the lateral ventricle. EXAM: MRI HEAD WITHOUT AND WITH CONTRAST TECHNIQUE: Multiplanar,  multiecho pulse sequences of the brain and surrounding structures were obtained without and with intravenous contrast. CONTRAST:  71mL MULTIHANCE GADOBENATE DIMEGLUMINE 529 MG/ML IV SOLN COMPARISON:  Preoperative brain MRI 09/28/2016 and earlier. FINDINGS: Brain: Right posterior paramidline craniotomy site with underlying small extra-axial air in fluid collection up to 11 mm in thickness. Subjacent resection cavity with a combination of fluid and gas. The resection cavity extends to the atrium of the right lateral ventricle just posterior to the choroid plexus. There is pneumoventricle, with gas in both frontal horns, but no ventriculomegaly. Small volume pneumocephalus along the right frontal convexity. A small peninsula of residual brain  parenchyma along the superior aspect of the resection cavity demonstrates restricted diffusion (series 3, image 28). There otherwise several small foci of restricted along the right splenium of the corpus callosum diffusion. Postcontrast enhancement at the resection cavity is nodular and curvilinear along the anterior margin and junction with the atrium of the right lateral ventricle, best demonstrated on series 11, image 27, series 12, image 8 and series 13, image 8. Mild surrounding T2 and FLAIR hyperintensity with largely resolved mass effect on the adjacent ventricle. Stable 10 mm enhancing left cerebellar metastasis on series 11, image 9. Continued confluent abnormal enhancement in both internal auditory canals, more so the left (series 11, images 12 and 13). Stable to slightly decreased punctate and curvilinear foci of enhancement along the left parietal lobe which are favored to be post ischemic rather than metastatic. No other enhancement suspicious for metastatic disease. Scattered nonspecific cerebral white matter T2 and FLAIR hyperintensity appears stable. No restricted diffusion to suggest acute infarction. No midline shift. Basilar cisterns remain patent. Cervicomedullary junction and pituitary are within normal limits. Vascular: Major intracranial vascular flow voids are stable aside from some extrinsic mass effect on the superior sagittal sinus near the craniectomy site. Skull and upper cervical spine: Negative visualized cervical spine. Stable heterogeneous calvarium bone marrow signal. Bone marrow signal at the skullbase and in the visible cervical spine remains normal. Sinuses/Orbits: Stable and negative orbits soft tissues. Mild paranasal sinus mucosal thickening is stable. Stable trace left mastoid effusion. Other: Postoperative changes to the superior and posterior scalp soft tissues including small scalp hematoma. IMPRESSION: 1. Significant debulking of the large right occipital lobe tumor.  Small volume of residual suspicious enhancement along the junction of the anterior resection cavity and atrium of the right lateral ventricle. Several small areas of postoperative restricted diffusion as seen on series 3. 2. Stable appearance of presumed metastatic disease in the left cerebellum and both internal auditory canals. No new leptomeningeal disease or brain metastasis identified. 3. Multiple punctate and small linear foci of cortical enhancement in the left parietal and posterior frontal lobes are still favored to be post ischemic in nature, but will require MRI surveillance. Electronically Signed   By: Genevie Ann M.D.   On: 10/04/2016 12:47   Mr Jeri Cos DQ Contrast  Result Date: 09/28/2016 CLINICAL DATA:  76 year old female with metastatic disease to the brain and in the chest. Subsequent encounter. EXAM: MRI HEAD WITHOUT AND WITH CONTRAST TECHNIQUE: Multiplanar, multiecho pulse sequences of the brain and surrounding structures were obtained without and with intravenous contrast. CONTRAST:  73mL MULTIHANCE GADOBENATE DIMEGLUMINE 529 MG/ML IV SOLN COMPARISON:  Brain MRI 09/19/2016. FINDINGS: Brain: Large heterogeneously enhancing right occipital lobe mass with extension to the atrium of the right lateral ventricle today encompasses 70 x 48 by 69 mm (AP by transverse by CC) versus 72 x 50 x 70  mm previously at a comparable level. Note that there is stable conspicuous increased enhancement at the ependyma of the posterior right lateral ventricle (series 11, image 96). Regional mass effect including on the posterior right lateral ventricle appears stable. 10 mm central metastasis in the left cerebellar hemisphere is stable in size. No regional mass effect. There are multiple small cortically based areas of enhancement in the left occipital pole which appear to correspond to the small diffusion restricted areas thought to be ischemia on 09/19/2016 (e.g. Series 11, image 53). Of note, Of note, there are also  multiple similar small cortically based areas of enhancement in the left parietal lobe, and these are in proximity to the small round cortically based enhancement which is stable on series 11, image 107 and which was thought to be metastatic on 09/19/2016. Evidence of new abnormal internal auditory canal enhancement left greater than right (series 11, images 43 and 44). This is also visible on the coronal and sagittal post-contrast images. No no other intracranial metastatic disease identified. No leptomeningeal enhancement identified elsewhere. There is a new small focus of restricted diffusion in the left superolateral frontal lobe on series 5, image 44) without enhancement). There is also an increased focus of restricted diffusion in the central splenium of the corpus callosum on series 5, image 32 (no enhancement). No other ischemic related restricted diffusion. Stable intracranial mass effect and scattered nonenhancing nonspecific bilateral cerebral white matter T2 and FLAIR hyperintense foci. No acute intracranial hemorrhage identified. Negative pituitary and cervicomedullary junction. Vascular: Major intracranial vascular flow voids are stable. Skull and upper cervical spine: Indeterminate 15 mm enhancing right posterior calvarium lesion appears stable on series 11, image 100. There are additional smaller right calvarial enhancing lesions (such as on series 11, image 117. Negative visualized cervical spine and spinal cord. Sinuses/Orbits: Stable and negative. Other: Mastoids remain clear.  Negative scalp soft tissues. IMPRESSION: 1. Large 7 cm right occipital lobe enhancing mass with associated mass effect is stable since 09/19/2016. Note associated posterior right lateral ventricle ependymal enhancement suspicious for extension along the ventricle. 2. New abnormal bilateral Internal Auditory Canal enhancement highly suspicious for leptomeningeal metastatic disease, although no other leptomeningeal  enhancement is identified. 3. 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 4. Multiple small cortically based areas of enhancement along the posterior left hemisphere appear to be post ischemic. This raises the possibility that the punctate enhancement (presumed third brain metastasis on 09/20/2015) in the left parietal lobe is instead post-ischemic. 5. New small acute lacunar infarct in the posterior left frontal lobe (series 5, image 44). Electronically Signed   By: Genevie Ann M.D.   On: 09/28/2016 18:14   Dg Chest Port 1 View  Result Date: 10/14/2016 CLINICAL DATA:  Bilateral lower extremity swelling. EXAM: PORTABLE CHEST 1 VIEW COMPARISON:  09/18/2016. FINDINGS: Mediastinum and hilar structures normal. Cardiomegaly with mild bilateral interstitial prominence and small bilateral pleural effusions findings consistent with congestive heart failure. Surgical clips right chest. IMPRESSION: Congestive heart failure with bilateral pulmonary interstitial edema and small bilateral pleural effusions similar findings noted on prior study of 09/18/2016. Electronically Signed   By: Marcello Moores  Register   On: 10/15/2016 15:22    Lab Data:  CBC:  Recent Labs Lab 10/13/2016 1548  WBC 14.0*  NEUTROABS 12.7*  HGB 15.5*  HCT 44.3  MCV 90.8  PLT 440   Basic Metabolic Panel:  Recent Labs Lab 10/20/2016 1548  NA 132*  K 4.0  CL 96*  CO2 26  GLUCOSE  127*  BUN 19  CREATININE 0.67  CALCIUM 8.6*   GFR: Estimated Creatinine Clearance: 62.2 mL/min (by C-G formula based on SCr of 0.67 mg/dL). Liver Function Tests:  Recent Labs Lab 10/25/2016 1548  AST 25  ALT 67*  ALKPHOS 94  BILITOT 0.6  PROT 5.8*  ALBUMIN 3.2*   No results for input(s): LIPASE, AMYLASE in the last 168 hours. No results for input(s): AMMONIA in the last 168 hours. Coagulation Profile: No results for input(s): INR, PROTIME in the last 168 hours. Cardiac Enzymes: No results for input(s): CKTOTAL, CKMB, CKMBINDEX, TROPONINI in the  last 168 hours. BNP (last 3 results) No results for input(s): PROBNP in the last 8760 hours. HbA1C:  Recent Labs  10/23/16 0520  HGBA1C 5.9*   CBG:  Recent Labs Lab 10/04/2016 1555  GLUCAP 128*   Lipid Profile:  Recent Labs  10/23/16 0520  CHOL 205*  HDL 72  LDLCALC 115*  TRIG 89  CHOLHDL 2.8   Thyroid Function Tests: No results for input(s): TSH, T4TOTAL, FREET4, T3FREE, THYROIDAB in the last 72 hours. Anemia Panel: No results for input(s): VITAMINB12, FOLATE, FERRITIN, TIBC, IRON, RETICCTPCT in the last 72 hours. Urine analysis:    Component Value Date/Time   COLORURINE YELLOW 10/25/2016 1548   APPEARANCEUR HAZY (A) 09/28/2016 1548   LABSPEC 1.010 10/06/2016 1548   PHURINE 7.0 10/24/2016 [place] 10/08/2016 [place] 10/26/2016 [place] 10/07/2016 1548   KETONESUR NEGATIVE 10/02/2016 1548   PROTEINUR NEGATIVE 10/02/2016 1548   NITRITE NEGATIVE 10/05/2016 1548   LEUKOCYTESUR NEGATIVE 10/05/2016 1548     Ripudeep Rai M.D. [facility] 10/23/2016, 11:28 AM  Pager: [phone] Between 7am to 7pm - call Pager - [phone]  After 7pm go to [email or web address] - password [email or web address]  Call night coverage person covering after 7pm

## 2016-10-23 NOTE — Progress Notes (Signed)
Inpatient Rehabilitation  Per PT, patient was screened by Gunnar Fusi for appropriateness for an Inpatient Acute Rehab consult.  At this time we are recommending an Inpatient Rehab consult.  Text paged MD to request; please order if you are agreeable.    Carmelia Roller., CCC/SLP Admission Coordinator  [place]  Cell [phone]

## 2016-10-23 NOTE — Evaluation (Signed)
Physical Therapy Evaluation Patient Details Name: Deanna Washington MRN: [id number] DOB: October 29, 1940 Today's Date: 10/23/2016   History of Present Illness  Pt is a 76 y.o.femaleCaucasian right-handed with past medical history of HTN, paroxysmal atrial fibrillation,chronic lower extremity edema, asthma, stage IV metastatic breast cancer status post craniotomy resection of right occipital brain metastatic lesion on 10/03/2016 was admitted to the hospital for worsening bilateral leg edema. MRI results show acute subcentimeter R cerebrallar infarcts.  Clinical Impression  Pt presented supine in bed with HOB elevated, awake and willing to participate in therapy session. Prior to admission, pt reported that she was independent with all functional mobility and ADLs, as well as acted as primary caregiver for her husband who is at home with Hospice care. Pt currently limited with functional mobility secondary to weakness throughout. She required min A for bed mobility, min-mod A for transfers with use of RW and ambulated a very short distance (~3') with RW and min A for stability. Pt is very eager and motivated to return to her PLOF and independence. Pt is an excellent candidate for CIR for further intensive therapy services to maximize her independence with functional mobility prior to returning home. PT will continue to follow acutely for mobility progression.    Follow Up Recommendations CIR    Equipment Recommendations  None recommended by PT    Recommendations for Other Services Rehab consult     Precautions / Restrictions Precautions Precautions: Fall Restrictions Weight Bearing Restrictions: No      Mobility  Bed Mobility Overal bed mobility: Needs Assistance Bed Mobility: Supine to Sit     Supine to sit: Min assist     General bed mobility comments: increased time and effort, assist to elevate trunk to achieve sitting upright at EOB  Transfers Overall transfer level: Needs  assistance Equipment used: Rolling walker (2 wheeled) Transfers: Sit to/from [facility] Sit to Stand: Mod assist Stand pivot transfers: Min assist       General transfer comment: increased time, cueing for safe hand placement, assist to power into full standing and for stability/safety  Ambulation/Gait Ambulation/Gait assistance: Min assist Ambulation Distance (Feet): 3 Feet Assistive device: Rolling walker (2 wheeled) Gait Pattern/deviations: Step-through pattern;Trunk flexed;Narrow base of support;Decreased step length - right;Decreased step length - left Gait velocity: decreased Gait velocity interpretation: Below normal speed for age/gender General Gait Details: pt only tolerated ambulating a very short distance (~3') at bedside after using BSC. pt with increased dyspnea and notable fatigue. Pt on RA with SPO2 at >94% throughout.  Stairs            Wheelchair Mobility    Modified Rankin (Stroke Patients Only) Modified Rankin (Stroke Patients Only) Pre-Morbid Rankin Score: No symptoms Modified Rankin: Moderately severe disability     Balance Overall balance assessment: Needs assistance Sitting-balance support: Feet supported Sitting balance-Leahy Scale: Fair     Standing balance support: During functional activity;Bilateral upper extremity supported Standing balance-Leahy Scale: Poor Standing balance comment: reliant on bilateral UEs on RW                             Pertinent Vitals/Pain Pain Assessment: No/denies pain    Home Living Family/patient expects to be discharged to:: Private residence Living Arrangements: Children;Spouse/significant other Available Help at Discharge: Family Type of Home: House Home Access: Ramped entrance     Home Layout: One level Home Equipment: Environmental consultant - 2 wheels;Tub bench;Bedside commode;Grab bars - tub/shower;Grab bars -  toilet      Prior Function Level of Independence: Independent          Comments: pt reported that she was the primary caregiver for her husband who is home with hospice     Hand Dominance   Dominant Hand: Right    Extremity/Trunk Assessment   Upper Extremity Assessment Upper Extremity Assessment: Defer to OT evaluation    Lower Extremity Assessment Lower Extremity Assessment: Generalized weakness (edema noted in bilateral lower legs and feet)    Cervical / Trunk Assessment Cervical / Trunk Assessment: Normal  Communication   Communication: No difficulties  Cognition Arousal/Alertness: Awake/alert Behavior During Therapy: WFL for tasks assessed/performed Overall Cognitive Status: Within Functional Limits for tasks assessed                                 General Comments: cognition not formally assessed but [facility] for general conversation and able to follow commands appropriately      General Comments      Exercises     Assessment/Plan    PT Assessment Patient needs continued PT services  PT Problem List Decreased strength;Decreased balance;Decreased mobility;Decreased coordination;Decreased knowledge of use of DME;Decreased safety awareness       PT Treatment Interventions DME instruction;Gait training;Stair training;Functional mobility training;Therapeutic activities;Therapeutic exercise;Balance training;Neuromuscular re-education;Patient/family education    PT Goals (Current goals can be found in the Care Plan section)  Acute Rehab PT Goals Patient Stated Goal: return to PLOF PT Goal Formulation: With patient Time For Goal Achievement: 11/06/16 Potential to Achieve Goals: Fair    Frequency Min 4X/week   Barriers to discharge        Co-evaluation               AM-PAC PT "6 Clicks" Daily Activity  Outcome Measure Difficulty turning over in bed (including adjusting bedclothes, sheets and blankets)?: A Little Difficulty moving from lying on back to sitting on the side of the bed? : Unable Difficulty  sitting down on and standing up from a chair with arms (e.g., wheelchair, bedside commode, etc,.)?: Unable Help needed moving to and from a bed to chair (including a wheelchair)?: A Little Help needed walking in hospital room?: A Little Help needed climbing 3-5 steps with a railing? : Total 6 Click Score: 12    End of Session Equipment Utilized During Treatment: Gait belt Activity Tolerance: Patient limited by fatigue Patient left: in bed;with call bell/phone within reach;Other (comment) (sitting EOB to eat lunch) Nurse Communication: Mobility status PT Visit Diagnosis: Difficulty in walking, not elsewhere classified (R26.2)    Time: 5366-4403 PT Time Calculation (min) (ACUTE ONLY): 17 min   Charges:   PT Evaluation $PT Eval Moderate Complexity: 1 Mod     PT G Codes:        [place], PT, DPT [place] 10/23/2016, 12:57 PM

## 2016-10-23 NOTE — Evaluation (Signed)
Occupational Therapy Evaluation Patient Details Name: Deanna Washington MRN: [id number] DOB: Jul 20, 1940 Today's Date: 10/23/2016    History of Present Illness Pt is a 76 y.o.femaleCaucasian right-handed with past medical history of HTN, paroxysmal atrial fibrillation,chronic lower extremity edema, asthma, stage IV metastatic breast cancer status post craniotomy resection of right occipital brain metastatic lesion on 10/03/2016 was admitted to the hospital for worsening bilateral leg edema. MRI results show acute subcentimeter R cerebrallar infarcts.   Clinical Impression   PTA, pt was living with her husband and son and was independent with ADLs and acting as primary caregiver for her husband. Pt currently requiring Min A for sit<>stand with RW and is able to perform ADLs in sitting with set up. Despite general fatigue, pt willing to participate in therapy. Pt would benefit from further acute OT to facilitate safe dc and increase occupational performance and participation. Recommend dc to CIR to optimize pt safety and independence with ADLs and functional mobility.    Follow Up Recommendations  CIR    Equipment Recommendations  Other (comment) (Defer to next venue)    Recommendations for Other Services PT consult;Rehab consult     Precautions / Restrictions Precautions Precautions: Fall Restrictions Weight Bearing Restrictions: No      Mobility Bed Mobility Overal bed mobility: Needs Assistance Bed Mobility: Sit to Supine     Supine to sit: Min assist Sit to supine: Min assist   General bed mobility comments: Min A to bring legs over EOB  Transfers Overall transfer level: Needs assistance Equipment used: Rolling walker (2 wheeled) Transfers: Sit to/from [facility] Sit to Stand: Min assist;From elevated surface Stand pivot transfers: Min assist       General transfer comment: increased time, cueing for safe hand placement, assist to power into full  standing and for stability/safety    Balance Overall balance assessment: Needs assistance Sitting-balance support: Feet supported Sitting balance-Leahy Scale: Fair     Standing balance support: During functional activity;Bilateral upper extremity supported Standing balance-Leahy Scale: Poor Standing balance comment: reliant on bilateral UEs on RW                           ADL either performed or assessed with clinical judgement   ADL Overall ADL's : Needs assistance/impaired Eating/Feeding: Supervision/ safety;Set up;Sitting   Grooming: Supervision/safety;Set up;Sitting   Upper Body Bathing: Set up;Sitting   Lower Body Bathing: Min guard;Sit to/from stand   Upper Body Dressing : Set up;Sitting   Lower Body Dressing: Min guard;Sit to/from stand Lower Body Dressing Details (indicate cue type and reason): Pt able to reach down and adjust socks               General ADL Comments: Pt able to perform ADLs in sitting. Limited by fatigued but agreeable to perform sit<>stand to assess standing balance.      Vision Baseline Vision/History: Wears glasses Wears Glasses: Reading only Patient Visual Report: Blurring of vision Vision Assessment?: Yes Eye Alignment: Within Functional Limits Ocular Range of Motion: Within Functional Limits Alignment/Gaze Preference: Within Defined Limits Tracking/Visual Pursuits: Decreased smoothness of horizontal tracking;Decreased smoothness of vertical tracking Saccades: Additional eye shifts occurred during testing;Additional head turns occurred during testing Convergence: Within functional limits Visual Fields: Right visual field deficit (central vision appears impaired) Depth Perception: Undershoots     Perception     Praxis      Pertinent Vitals/Pain Pain Assessment: No/denies pain     Hand Dominance Right  Extremity/Trunk Assessment Upper Extremity Assessment Upper Extremity Assessment: LUE deficits/detail LUE  Deficits / Details: Stength WFL. Pt demostrating decreased coordination during nose-to-finger test.  LUE Coordination: decreased fine motor   Lower Extremity Assessment Lower Extremity Assessment: Generalized weakness RLE Deficits / Details: Bilateral LE edema with significantly greater edema in RLE.    Cervical / Trunk Assessment Cervical / Trunk Assessment: Normal   Communication Communication Communication: No difficulties   Cognition Arousal/Alertness: Awake/alert Behavior During Therapy: WFL for tasks assessed/performed Overall Cognitive Status: No family/caregiver present to determine baseline cognitive functioning Area of Impairment: Attention;Memory;Awareness                   Current Attention Level: Selective Memory: Decreased short-term memory     Awareness: Emergent   General Comments: cognition not formally assessed but [facility] for general conversation and able to follow commands appropriately   General Comments  Significant edema in BLE, RLE > LLE.    Exercises     Shoulder Instructions      Home Living Family/patient expects to be discharged to:: Private residence Living Arrangements: Children;Spouse/significant other Available Help at Discharge: Family Type of Home: House Home Access: [place]: One level     Bathroom Shower/Tub: Occupational psychologist: [place] Accessibility: Yes How Accessible: Accessible via walker Home Equipment: [place] - 2 wheels;Tub bench;Bedside commode;Grab bars - tub/shower;Grab bars - toilet   Additional Comments: Daughter lives close by and assists with IADLs  Lives With: Spouse;Son    Prior Functioning/Environment Level of Independence: Independent        Comments: pt reported that she was the primary caregiver for her husband who is home with hospice. Pt daughter performs Control and instrumentation engineer and driving.         OT Problem List: Impaired balance (sitting and/or  standing);Impaired vision/perception;Decreased coordination;Decreased cognition;Decreased safety awareness;Decreased knowledge of use of DME or AE      OT Treatment/Interventions: Self-care/ADL training;Therapeutic exercise;DME and/or AE instruction;Therapeutic activities;Cognitive remediation/compensation;Visual/perceptual remediation/compensation;Patient/family education;Balance training    OT Goals(Current goals can be found in the care plan section) Acute Rehab OT Goals Patient Stated Goal: return to PLOF OT Goal Formulation: With patient Time For Goal Achievement: 11/06/16 Potential to Achieve Goals: Good ADL Goals Pt Will Perform Grooming: with modified independence;standing Pt Will Perform Upper Body Dressing: with modified independence;sitting Pt Will Perform Lower Body Dressing: with modified independence;sit to/from stand Pt Will Transfer to Toilet: with modified independence;ambulating;bedside commode Pt Will Perform Toileting - Clothing Manipulation and hygiene: with modified independence;sit to/from stand  OT Frequency: Min 2X/week   Barriers to D/C:            Co-evaluation              AM-PAC PT "6 Clicks" Daily Activity     Outcome Measure Help from another person eating meals?: None Help from another person taking care of personal grooming?: None Help from another person toileting, which includes using toliet, bedpan, or urinal?: A Little Help from another person bathing (including washing, rinsing, drying)?: A Little Help from another person to put on and taking off regular upper body clothing?: None Help from another person to put on and taking off regular lower body clothing?: A Little 6 Click Score: 21   End of Session Nurse Communication: Mobility status  Activity Tolerance: Patient tolerated treatment well;Patient limited by fatigue Patient left: in chair;with call bell/phone within reach;with family/visitor present  OT Visit Diagnosis: Unsteadiness  on feet (R26.81);Low vision, both eyes (H54.2);Other symptoms and signs involving cognitive function                Time: 1212-1227 OT Time Calculation (min): 15 min Charges:  OT General Charges $OT Visit: 1 Visit OT Evaluation $OT Eval Moderate Complexity: 1 Mod G-Codes:     [place] MSOT, OTR/L Acute Rehab Pager: [phone] Office: [place] 10/23/2016, 2:51 PM

## 2016-10-23 NOTE — Care Management Note (Signed)
Case Management Note  Patient Details  Name: Deanna Washington MRN: [id number] Date of Birth: [birth year]  Subjective/Objective:   Pt admitted with CVA. She recently was admitted for Craniostomy d/t brain mets. She is from home with her spouse and was active with [facility] for [facility] PT/OT.                  Action/Plan: PT recommending CIR. Awaiting OT recommendations. CM following for d/c disposition.    Expected Discharge Date:   (unknown)               Expected Discharge Plan:  IP Rehab Facility  In-House Referral:  Clinical Social Work  Discharge planning Services  CM Consult  Post Acute Care Choice:    Choice offered to:     DME Arranged:    DME Agency:     HH Arranged:    [place] Agency:     Status of Service:  In process, will continue to follow  If discussed at Long Length of Stay Meetings, dates discussed:    Additional Comments:  Pollie Friar, RN 10/23/2016, 12:58 PM

## 2016-10-23 NOTE — Consult Note (Signed)
Physical Medicine and Rehabilitation Consult Reason for Consult:Decreased functional mobility Referring Physician: [facility]   HPI: Deanna Washington is a 76 y.o. 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 Presented 10/26/2016 with lower extremity edema and weakness bilateral lower extremities as well as blurred vision. 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 MRI showed acute subcentimeter right cerebellar infarction less likely new metastasis. Recent echocardiogram with ejection fraction of 98% grade 1 diastolic dysfunction. Carotid Dopplers with no ICA stenosis.Lower extremity Dopplers negative for DVT. Patient remains on [place] for seizure prophylaxis. Decadron protocol has been directed.Neurology follow-up await plan for anticoagulation. Physical occupational therapy evaluations completed with recommendations of physical medicine rehabilitation consult.   Review of Systems  Constitutional: Negative for chills and fever.  Eyes: Positive for blurred vision. Negative for double vision.  Respiratory: Negative for cough and shortness of breath.   Cardiovascular: Positive for palpitations and leg swelling. Negative for chest pain.  Gastrointestinal: Positive for constipation. Negative for nausea and vomiting.  Genitourinary: Negative for flank pain and hematuria.  Musculoskeletal: Positive for joint pain and myalgias.  Skin: Negative for rash.  Neurological: Positive for seizures, weakness and headaches.  All other systems reviewed and are negative.  Past Medical History:  Diagnosis Date  . Asthma   . 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 Breast cancer [facility])    believes it was in 2010  . HTN (hypertension)   . 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 BREAST LUMPECTOMY     lumpectomy  . CRANIOTOMY Right 10/03/2016   Procedure: Right Occipital CRANIOTOMY TUMOR EXCISION with Curve;  Surgeon: Kary Kos, MD;  Location: [facility];  Service: Neurosurgery;  Laterality: Right;   Family History  Problem Relation Age of Onset  . Lung cancer Sister   . Esophageal cancer Brother   . Leukemia Maternal Aunt   . Lung cancer Brother   . Leukemia Brother   . Lung cancer Sister   . Breast cancer Neg Hx   . Colon cancer Neg Hx   . Pancreatic cancer Neg Hx    Social History:  reports that she has quit smoking. She has never used smokeless tobacco. She reports that she drinks about 1.2 oz of alcohol per week . She reports that she does not use drugs. Allergies: No Known Allergies Medications Prior to Admission  Medication Sig Dispense Refill  . ADVAIR DISKUS 500-50 MCG/DOSE AEPB Inhale 1 puff into the lungs 2 (two) times daily. 60 each 6  . amiodarone (PACERONE) 200 MG tablet Take 0.5 tablets (100 mg total) by mouth 2 (two) times daily. 30 tablet 5  . dexamethasone (DECADRON) 4 MG tablet Take 2 tablets (8 mg total) by mouth 2 (two) times daily with a meal. (Patient taking differently: Take 4-8 mg by mouth 3 (three) times daily. Take 8mg  by mouth in the morning, 4mg  in the afternoon, and 8mg  at bedtime) 120 tablet 0  . diphenhydrAMINE (BENADRYL) 25 mg capsule Take 25 mg by mouth at bedtime as needed for sleep.     . furosemide (LASIX) 20 MG  tablet Take 1 tablet (20 mg total) by mouth daily. 30 tablet 0  . HYDROcodone-acetaminophen (NORCO/VICODIN) 5-325 MG tablet Take 1 tablet by mouth every 4 (four) hours as needed for moderate pain. 30 tablet 0  . levETIRAcetam (KEPPRA) 500 MG tablet Take 1 tablet (500 mg total) by mouth 2 (two) times  daily. 60 tablet 1  . LORazepam (ATIVAN) 0.5 MG tablet 1 tablet po 30 minutes prior to radiation or MRI 30 tablet 0  . nystatin (MYCOSTATIN) 100000 UNIT/ML suspension Take 5 mLs (500,000 Units total) by mouth 4 (four) times daily. 473 mL 0  . ondansetron (ZOFRAN) 4 MG tablet Take 1 tablet (4 mg total) by mouth every 4 (four) hours as needed for nausea or vomiting. 20 tablet 0  . pantoprazole (PROTONIX) 40 MG tablet Take 1 tablet (40 mg total) by mouth daily. 30 tablet 0  . PROAIR HFA 108 (90 Base) MCG/ACT inhaler Place 1-2 puffs into alternate nostrils every 4 (four) hours as needed for wheezing.    Marland Kitchen atorvastatin (LIPITOR) 20 MG tablet Take 1 tablet (20 mg total) by mouth daily. (Patient not taking: Reported on 10/16/2016) 30 tablet 0  . lapatinib (TYKERB) 250 MG tablet Take 3 tablets (750 mg total) by mouth daily. Take on an empty stomach, 1 hour before or 2 hours after a meal 90 tablet 0    Home: Home Living Family/patient expects to be discharged to:: Private residence Living Arrangements: Children, Spouse/significant other Available Help at Discharge: Family Type of Home: House Home Access: Ramped entrance Home Layout: One level Bathroom Shower/Tub: Multimedia programmer: Standard Bathroom Accessibility: Yes Home Equipment: Environmental consultant - 2 wheels, Tub bench, Bedside commode, Grab bars - tub/shower, Grab bars - toilet Additional Comments: Daughter lives close by and assists with IADLs  Lives With: Spouse, Son  Functional History: Prior Function Level of Independence: Independent Comments: pt reported that she was the primary caregiver for her husband who is home with hospice. Pt daughter performs Control and instrumentation engineer and driving.  Functional Status:  Mobility: Bed Mobility Overal bed mobility: Needs Assistance Bed Mobility: Sit to Supine Supine to sit: Min assist Sit to supine: Min assist General bed mobility comments: Min A to bring legs over EOB Transfers Overall transfer  level: Needs assistance Equipment used: Rolling walker (2 wheeled) Transfers: Sit to/from Stand, [employer] Transfers Sit to Stand: Min assist, From elevated surface Stand pivot transfers: Min assist General transfer comment: increased time, cueing for safe hand placement, assist to power into full standing and for stability/safety Ambulation/Gait Ambulation/Gait assistance: Min assist Ambulation Distance (Feet): 3 Feet Assistive device: Rolling walker (2 wheeled) Gait Pattern/deviations: Step-through pattern, Trunk flexed, Narrow base of support, Decreased step length - right, Decreased step length - left General Gait Details: pt only tolerated ambulating a very short distance (~3') at bedside after using BSC. pt with increased dyspnea and notable fatigue. Pt on RA with SPO2 at >94% throughout. Gait velocity: decreased Gait velocity interpretation: Below normal speed for age/gender    ADL: ADL Overall ADL's : Needs assistance/impaired Eating/Feeding: Supervision/ safety, Set up, Sitting Grooming: Supervision/safety, Set up, Sitting Upper Body Bathing: Set up, Sitting Lower Body Bathing: Min guard, Sit to/from stand Upper Body Dressing : Set up, Sitting Lower Body Dressing: Min guard, Sit to/from stand Lower Body Dressing Details (indicate cue type and reason): Pt able to reach down and adjust socks General ADL Comments: Pt able to perform ADLs in sitting. Limited by fatigued but agreeable to perform sit<>stand to assess  standing balance.   Cognition: Cognition Overall Cognitive Status: Impaired/Different from baseline Arousal/Alertness: Awake/alert Orientation Level: Oriented X4 Attention: Selective Selective Attention: Impaired Selective Attention Impairment: Functional basic Memory: Impaired Memory Impairment: Decreased recall of new information Awareness: Impaired Awareness Impairment: Intellectual impairment Problem Solving: Impaired Problem Solving Impairment:  Functional basic Cognition Arousal/Alertness: Awake/alert Behavior During Therapy: WFL for tasks assessed/performed Overall Cognitive Status: Impaired/Different from baseline Area of Impairment: Attention, Memory, Awareness Current Attention Level: Selective Memory: Decreased short-term memory Awareness: Emergent General Comments: cognition not formally assessed but [facility] for general conversation and able to follow commands appropriately  Blood pressure (!) 144/85, pulse 97, temperature 98.6 F (37 C), temperature source Oral, resp. rate 18, height 5\' 8"  (1.727 m), weight 72.8 kg (160 lb 8 oz), SpO2 97 %. Physical Exam  Constitutional: No distress.  HENT:  Craniotomy site healed  Neck: Normal range of motion. Neck supple. No thyromegaly present.  Cardiovascular:  Cardiac rate controlled  Respiratory: Effort normal and breath sounds normal. No respiratory distress.  GI: Soft. Bowel sounds are normal. She exhibits no distension.  Musculoskeletal:  2+ edema bilateral LE  Neurological: She is alert.  Makes good eye contact. She does have some word finding difficulty as well as some decrease in awareness of her deficits. Decreased FTN, HTN R>L, BUE 4/5 prox to distal. LE: 2-HF, 3-KE and 3/5 ADF/PF. Sensation intact to pain and LT  Psychiatric: She has a normal mood and affect. Her behavior is normal.    Results for orders placed or performed during the hospital encounter of 09/29/2016 (from the past 24 hour(s))  Comprehensive metabolic panel     Status: Abnormal   Collection Time: 10/14/2016  3:48 PM  Result Value Ref Range   Sodium 132 (L) 135 - 145 mmol/L   Potassium 4.0 3.5 - 5.1 mmol/L   Chloride 96 (L) 101 - 111 mmol/L   CO2 26 22 - 32 mmol/L   Glucose, Bld 127 (H) 65 - 99 mg/dL   BUN 19 6 - 20 mg/dL   Creatinine, Ser 0.67 0.44 - 1.00 mg/dL   Calcium 8.6 (L) 8.9 - 10.3 mg/dL   Total Protein 5.8 (L) 6.5 - 8.1 g/dL   Albumin 3.2 (L) 3.5 - 5.0 g/dL   AST 25 15 - 41 U/L   ALT 67 (H)  14 - 54 U/L   Alkaline Phosphatase 94 38 - 126 U/L   Total Bilirubin 0.6 0.3 - 1.2 mg/dL   GFR calc non Af Amer >60 >60 mL/min   GFR calc Af Amer >60 >60 mL/min   Anion gap 10 5 - 15  CBC WITH DIFFERENTIAL     Status: Abnormal   Collection Time: 10/01/2016  3:48 PM  Result Value Ref Range   WBC 14.0 (H) 4.0 - 10.5 K/uL   RBC 4.88 3.87 - 5.11 MIL/uL   Hemoglobin 15.5 (H) 12.0 - 15.0 g/dL   HCT 44.3 36.0 - 46.0 %   MCV 90.8 78.0 - 100.0 fL   MCH 31.8 26.0 - 34.0 pg   MCHC 35.0 30.0 - 36.0 g/dL   RDW 12.9 11.5 - 15.5 %   Platelets 335 150 - 400 K/uL   Neutrophils Relative % 91 %   Neutro Abs 12.7 (H) 1.7 - 7.7 K/uL   Lymphocytes Relative 4 %   Lymphs Abs 0.6 (L) 0.7 - 4.0 K/uL   Monocytes Relative 5 %   Monocytes Absolute 0.6 0.1 - 1.0 K/uL   Eosinophils Relative 0 %  Eosinophils Absolute 0.0 0.0 - 0.7 K/uL   Basophils Relative 0 %   Basophils Absolute 0.0 0.0 - 0.1 K/uL  Urinalysis, Complete w Microscopic     Status: Abnormal   Collection Time: 10/06/2016  3:48 PM  Result Value Ref Range   Color, Urine YELLOW YELLOW   APPearance HAZY (A) CLEAR   Specific Gravity, Urine 1.010 1.005 - 1.030   pH 7.0 5.0 - 8.0   Glucose, UA NEGATIVE NEGATIVE mg/dL   Hgb urine dipstick NEGATIVE NEGATIVE   Bilirubin Urine NEGATIVE NEGATIVE   Ketones, ur NEGATIVE NEGATIVE mg/dL   Protein, ur NEGATIVE NEGATIVE mg/dL   Nitrite NEGATIVE NEGATIVE   Leukocytes, UA NEGATIVE NEGATIVE   RBC / HPF 0-5 0 - 5 RBC/hpf   WBC, UA 0-5 0 - 5 WBC/hpf   Bacteria, UA RARE (A) NONE SEEN   Squamous Epithelial / LPF 0-5 (A) NONE SEEN   Mucus PRESENT    Hyaline Casts, UA PRESENT   Brain natriuretic peptide     Status: None   Collection Time: 10/14/2016  3:48 PM  Result Value Ref Range   B Natriuretic Peptide 47.5 0.0 - 100.0 pg/mL  CBG monitoring, ED     Status: Abnormal   Collection Time: 10/10/2016  3:55 PM  Result Value Ref Range   Glucose-Capillary 128 (H) 65 - 99 mg/dL  Osmolality, urine     Status: None    Collection Time: 10/23/16 12:30 AM  Result Value Ref Range   Osmolality, Ur 612 300 - 900 mOsm/kg  Hemoglobin A1c     Status: Abnormal   Collection Time: 10/23/16  5:20 AM  Result Value Ref Range   Hgb A1c MFr Bld 5.9 (H) 4.8 - 5.6 %   Mean Plasma Glucose 122.63 mg/dL  Lipid panel     Status: Abnormal   Collection Time: 10/23/16  5:20 AM  Result Value Ref Range   Cholesterol 205 (H) 0 - 200 mg/dL   Triglycerides 89 <150 mg/dL   HDL 72 >40 mg/dL   Total CHOL/HDL Ratio 2.8 RATIO   VLDL 18 0 - 40 mg/dL   LDL Cholesterol 115 (H) 0 - 99 mg/dL   Ct Head Wo Contrast  Result Date: 10/04/2016 CLINICAL DATA:  Expressive aphasia, lethargy, difficulty standing, LEFT peripheral vision loss for 2 days. History of metastatic breast cancer on chemo radiation. Status post debulking of RIGHT occipital tumor. EXAM: CT HEAD WITHOUT CONTRAST TECHNIQUE: Contiguous axial images were obtained from the base of the skull through the vertex without intravenous contrast. COMPARISON:  MRI of the head October 04, 2016 and CT HEAD September 18, 2016 FINDINGS: BRAIN: RIGHT occipital resection cavity, re-expanded RIGHT occipital horn. No significant residual mass effect, surrounding low-density encephalomalacia. Moderate parenchymal brain volume loss for age. Patchy supratentorial white matter hypodensities, some of which reflect metastatic disease characterized on prior MRI. Subcentimeter LEFT temporal lobe metastasis better seen on prior MRI. No acute large vascular territory infarct. No intraparenchymal hemorrhage. No extra-axial fluid collections. Basal cisterns are patent. VASCULAR: Mild to moderate calcific atherosclerosis of the carotid siphons. SKULL: Status post RIGHT parieto-occipital craniotomy. RIGHT calvarial suspected metastasis better characterized on prior MRI. RIGHT parieto-occipital scalp fluid collection measuring 6.2 x 1.4 cm. Borderline partially empty sella. SINUSES/ORBITS: Mild paranasal sinus mucosal  thickening without air-fluid levels. Mastoid air cells are well aerated. The included ocular globes and orbital contents are non-suspicious. Status post bilateral ocular lens implants. OTHER: None. IMPRESSION: 1. Interval RIGHT craniotomy for debulking occipital lobe tumor,  improved appearance with decreased mass effect and re-expanded RIGHT occipital horn. 2. 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 3. Moderate white matter changes consistent chronic small vessel ischemic disease and known metastasis. Electronically Signed   By: Elon Alas M.D.   On: 10/05/2016 16:47   Mr Brain Wo Contrast (neuro Protocol)  Result Date: 10/19/2016 CLINICAL DATA:  Altered level consciousness. History of metastatic breast cancer. Status post recent bulking of RIGHT occipital lobe metastasis. EXAM: MRI HEAD WITHOUT CONTRAST TECHNIQUE: Multiplanar, multiecho pulse sequences of the brain and surrounding structures were obtained without intravenous contrast. COMPARISON:  CT HEAD October 22, 2016 at 1612 hours and MRI of the head October 04, 2016 and August 23rd 2018 and FINDINGS: BRAIN: Multiple new subcentimeter foci of reduced diffusion RIGHT cerebellum with low ADC values. Stable subcentimeter reduced diffusion and splenium of corpus callosum and RIGHT medial parietal lobe without ADC abnormality, possible mild nonenhancing tumor. Increasing fourth ventricle periventricular FLAIR T2 hyperintensities. Re- demonstration of LEFT cerebellar metastasis. Status post debulking of RIGHT occipital lobe tumor, with surrounding presumable gliosis and, potential residual tumor without mass effect. Susceptibility artifact within and along the margin of the resection cavity. No midline shift. Re-expanded RIGHT occipital horn and RIGHT atrium. No hydrocephalus. Similar patchy to confluent supratentorial white matter FLAIR T2 hyperintensities. LEFT parietal cortical T2  hyperintensity corresponding to known metastasis. No abnormal extra-axial fluid collections. Focal dural thickening subjacent to craniotomy. VASCULAR: Normal major intracranial vascular flow voids present at skull base. SKULL AND UPPER CERVICAL SPINE: Scattered mildly expansile T2 bright foci within RIGHT calvarial diploic space concerning for metastatic disease. Status post RIGHT parieto-occipital craniotomy. Partially empty sella. The Craniocervical junction maintained. SINUSES/ORBITS: Trace mastoid effusions. Mild paranasal sinus mucosal thickening without air-fluid levels. The included ocular globes and orbital contents are non-suspicious. Status post bilateral ocular lens implants. OTHER: Larger RIGHT parietoccipital T2 bright fluid collection with susceptibility artifact overlying craniotomy, predominately of loosening signal on FLAIR sequence. IMPRESSION: 1. Acute subcentimeter RIGHT cerebellar infarcts, less likely new metastasis. 2. Interval debulking of RIGHT occipital tumor with improved appearance, residual edema and potential tumor ; limited assessment without contrast. No residual mass effect or midline shift. 3. 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 4. Additional intracranial metastasis better characterized on prior MRI. 5. Moderate chronic small vessel ischemic disease. Electronically Signed   By: Elon Alas M.D.   On: 10/05/2016 17:27   Dg Chest Port 1 View  Result Date: 10/15/2016 CLINICAL DATA:  Bilateral lower extremity swelling. EXAM: PORTABLE CHEST 1 VIEW COMPARISON:  09/18/2016. FINDINGS: Mediastinum and hilar structures normal. Cardiomegaly with mild bilateral interstitial prominence and small bilateral pleural effusions findings consistent with congestive heart failure. Surgical clips right chest. IMPRESSION: Congestive heart failure with bilateral pulmonary interstitial edema and small bilateral pleural effusions similar findings noted on  prior study of 09/18/2016. Electronically Signed   By: Marcello Moores  Register   On: 10/10/2016 15:22    Assessment/Plan: Diagnosis: right cerebellar infarct with hx of right occ craniotomy 1. Does the need for close, 24 hr/day medical supervision in concert with the patient's rehab needs make it unreasonable for this patient to be served in a less intensive setting? Yes 2. Co-Morbidities requiring supervision/potential complications: breat cancer, afib,  3. Due to bladder management, bowel management, safety, skin/wound care, disease management, medication administration, pain management and patient education, does the patient require 24 hr/day rehab nursing? Yes 4. Does the patient require coordinated care of a  physician, rehab nurse, PT (1-2 hrs/day, 5 days/week), OT (1-2 hrs/day, 5 days/week) and SLP (1-2 hrs/day, 5 days/week) to address physical and functional deficits in the context of the above medical diagnosis(es)? Yes Addressing deficits in the following areas: balance, endurance, locomotion, strength, transferring, bowel/bladder control, bathing, dressing, feeding, grooming, toileting, cognition, speech and psychosocial support 5. Can the patient actively participate in an intensive therapy program of at least 3 hrs of therapy per day at least 5 days per week? Yes 6. The potential for patient to make measurable gains while on inpatient rehab is excellent 7. Anticipated functional outcomes upon discharge from inpatient rehab are modified independent and supervision  with PT, modified independent and supervision with OT, modified independent and supervision with SLP. 8. Estimated rehab length of stay to reach the above functional goals is: 7-11 days 9. Anticipated D/C setting: Home 10. Anticipated post D/C treatments: HH therapy and Outpatient therapy 11. Overall Rehab/Functional Prognosis: excellent  RECOMMENDATIONS: This patient's condition is appropriate for continued rehabilitative care in  the following setting: CIR Patient has agreed to participate in recommended program. Yes Note that insurance prior authorization may be required for reimbursement for recommended care.  Comment: Rehab Admissions Coordinator to follow up.  Thanks,  Meredith Staggers, MD, Mellody Drown    Cathlyn Parsons., PA-C 10/23/2016

## 2016-10-23 NOTE — Progress Notes (Signed)
Carotid Duplex performed 09/19/16: Bil 1-39% ICA stenosis. Usually repeat not needed less than 6 months. Please advise if this order is still needed at this time.    Vascular lab [phone]

## 2016-10-23 NOTE — Progress Notes (Signed)
STROKE TEAM PROGRESS NOTE   HISTORY OF PRESENT ILLNESS (per record) Deanna Washington is an 76 y.o. right-handed Caucasian female with a past medical history of HTN, paroxysmal atrial fibrillation,chronic lower extremity edema, asthma, stage IV metastatic breast cancer status post craniotomy resection of right occipital brain metastatic lesion on 10/03/2016 was admitted to the hospital for worsening bilateral leg edema. She saw her neurosurgeon today and was sent to the emergency room. 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 While these are likely to be incidental, neurology was consulted for further evaluation.  Prior Cancer history and workup:  The patient was diagnosed  breast cancer 8 years ago  status post lumpectomy and radiation and had been in remission presented in August  with progressive headache, nausea and vomiting. MRI showed  a 6 x 5 x 6.4 cm heterogeneous enhancing mass in the posterior right cerebrum spinning but the parietal and occipital lobes with scattered cystic spaces and blood products. There is local mass effect and mild vasogenic edema. There are also 2 additional masses a 10 mm left cerebellar mass and a 3 mm left parietal cortex mass suggestive of metastatic disease. In addition, she had punctate areas of cortical diffusion hyperintensity in the left occipital lobe consistent with small acute infarcts with a subacute ischemia in the upper right cerebellum. New diagnosis of atrial fibrillation was detected during hospitalization or anticoagulation due to possible brain surgery.the patient was discharged with Decadron and Keppra. Subsequently the patient underwent craniotomy with biopsy on 10/03/2016. Her plan is to undergo radiation with Dr. Lisbeth Renshaw and start on Lapatinib.   Patient was not administered IV t-PA secondary to  recent neurosurgery. She was admitted to General Neurology for further evaluation and treatment.   SUBJECTIVE (INTERVAL HISTORY) No family is at the bedside.  The pt is awake, alert, and follows all commands appropriately.  On post-op decadron. MRI showed new punctate infarcts at right cerebellum. Dr. Tana Coast talked with Dr. Saintclair Halsted and she is ok to be on [facility] now. Will recommend eliquis.    OBJECTIVE Temp:  [97.6 F (36.4 C)-98.6 F (37 C)] 98.6 F (37 C) (09/26 1418) Pulse Rate:  [50-97] 97 (09/26 1418) Cardiac Rhythm: Normal sinus rhythm (09/26 0700) Resp:  [16-20] 18 (09/26 1418) BP: (143-166)/(81-92) 144/85 (09/26 1418) SpO2:  [90 %-97 %] 97 % (09/26 1418) FiO2 (%):  [0 %] 0 % (09/25 2317) Weight:  [72.8 kg (160 lb 8 oz)-77.6 kg (171 lb)] 72.8 kg (160 lb 8 oz) (09/25 2314)  CBC:   Recent Labs Lab 10/14/2016 1548  WBC 14.0*  NEUTROABS 12.7*  HGB 15.5*  HCT 44.3  MCV 90.8  PLT 811    Basic Metabolic Panel:   Recent Labs Lab 10/14/2016 1548  NA 132*  K 4.0  CL 96*  CO2 26  GLUCOSE 127*  BUN 19  CREATININE 0.67  CALCIUM 8.6*    Lipid Panel:     Component Value Date/Time   CHOL 205 (H) 10/23/2016 0520   TRIG 89 10/23/2016 0520   HDL 72 10/23/2016 0520   CHOLHDL 2.8 10/23/2016 0520   VLDL 18 10/23/2016 0520   LDLCALC 115 (H) 10/23/2016 0520   HgbA1c:  Lab Results  Component Value Date   HGBA1C 5.9 (H) 10/23/2016   Urine Drug Screen: No results found for: LABOPIA, COCAINSCRNUR, LABBENZ, AMPHETMU, THCU, LABBARB  Alcohol Level  No results found for: [facility]  IMAGING I have personally reviewed the radiological images below and agree with the radiology interpretations.  Ct Head Wo Contrast 09/29/2016 IMPRESSION: 1. Interval RIGHT craniotomy for debulking occipital lobe tumor, improved appearance with decreased mass effect and re-expanded RIGHT occipital horn. 2. 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 3.  Moderate white matter changes consistent chronic small vessel ischemic disease and known metastasis.  Mr Brain Wo Contrast (neuro Protocol) 10/13/2016 IMPRESSION: 1. Acute subcentimeter RIGHT cerebellar infarcts, less likely new metastasis. 2. Interval debulking of RIGHT occipital tumor with improved appearance, residual edema and potential tumor ; limited assessment without contrast. No residual mass effect or midline shift. 3. 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 4. Additional intracranial metastasis better characterized on prior MRI. 5. Moderate chronic small vessel ischemic disease.  Dg Chest Port 1 View 10/17/2016 IMPRESSION: Congestive heart failure with bilateral pulmonary interstitial edema and small bilateral pleural effusions similar findings noted on prior study of 09/18/2016.  Lower Extremity US (DVT) 09/29/2016 - No evidence of deep vein or superficial thrombosis involving the   right lower extremity and left lower extremity. - No evidence of Baker&'s cyst on the right or left.  Carotid US 09/20/2016 Findings suggest 1-39% internal carotid artery stenosis bilaterally. Vertebral arteries are patent with antegrade flow.  TTE 09/19/2016 Study Conclusions - Left ventricle: The cavity size was normal. There was mild concentric hypertrophy. Systolic function was normal. The estimated ejection fraction was in the range of 60% to 65%. Wall motion was normal; there were no regional wall motion abnormalities. Doppler parameters are consistent with abnormal left ventricular relaxation (grade 1 diastolic dysfunction).  There was no evidence of elevated ventricular filling pressure by Doppler parameters. - Aortic valve: There was mild regurgitation. - Aortic root: The aortic root was normal in size. - Mitral valve: There was trivial regurgitation. - Left atrium: The atrium was normal in size. - Right atrium: The atrium was mildly dilated. - Tricuspid  valve: There was moderate regurgitation. - Pulmonic valve: There was no regurgitation. - Pulmonary arteries: Systolic pressure was moderately increased.  PA peak pressure: 45 mm Hg (S). - Inferior vena cava: The vessel was dilated. The respirophasic diameter changes were blunted (< 50%), consistent with elevated central venous pressure. - Pericardium, extracardiac: A trivial pericardial effusion was identified posterior to the heart. Features were not consistent with tamponade physiology. Impressions: - No cardiac source of emboli was indentified.   PHYSICAL EXAM  Temp:  [97.9 F (36.6 C)-98.6 F (37 C)] 98.6 F (37 C) (09/26 1418) Pulse Rate:  [50-97] 97 (09/26 1418) Resp:  [16-20] 18 (09/26 1418) BP: (143-166)/(81-87) 144/85 (09/26 1418) SpO2:  [90 %-97 %] 97 % (09/26 1418) FiO2 (%):  [0 %] 0 % (09/25 2317) Weight:  [160 lb 8 oz (72.8 kg)-171 lb (77.6 kg)] 160 lb 8 oz (72.8 kg) (09/25 2314)  General - Well nourished, well developed, sitting in chair, mildly acute distress due to back pain.  Ophthalmologic - Fundi not visualized due to mildly acute distress.  Cardiovascular - Regular rate and rhythm, not in A. fib.  Mental Status -  Level of arousal and orientation to time, place, and person were intact. Language including expression, naming, repetition, comprehension was assessed and found intact, but mild dysarthria.  Cranial Nerves II - XII - II - Visual field intact OU. III, IV, VI - Extraocular movements intact. Chronic disconjugate eyes without diplopia V -  Facial sensation intact bilaterally. VII - right nasolabial fold flattening. VIII - Hearing & vestibular intact bilaterally. X - Palate elevates symmetrically, mild dysarthria. XI - Chin turning & shoulder shrug intact bilaterally. XII - Tongue protrusion intact.  Motor Strength - The patient's strength was normal in both upper extremities and pronator drift was absent. Bilateral lower extremity swollen, 3/5 LLE,  and 3-/5 RLE.  Bulk was normal and fasciculations were absent.   Motor Tone - Muscle tone was assessed at the neck and appendages and was normal.  Reflexes - The patient's reflexes were 1+ in all extremities and she had no pathological reflexes.  Sensory - Light touch, temperature/pinprick were assessed and were symmetrical.    Coordination - The patient had left arm dysmetria.  Tremor was absent.  Gait and Station - not tested.   ASSESSMENT/PLAN Ms. Dorisann Schwanke is a 76 y.o. female with history of  HTN, paroxysmal atrial fibrillation,chronic lower extremity edema, asthma, stage IV metastatic breast cancer status post craniotomy resection of right occipital brain metastatic lesion on 10/03/2016 was admitted to by her neurosurgeon after presenting with BLE edema and confusion.   She did not receive IV t-PA due to recent neurosurgery.   Stroke, incidental finding: Acute subcentimeter RIGHT cerebellar infarcts, likely embolic, in the setting of metastatic cancer/hypercoagulable state and PAF with anticoagulation held for recent intracranial tumor debulking surgery.  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  MRI head: Acute subcentimeter RIGHT cerebellar infarcts  Carotid Doppler 09/20/16 unremarkable   2D Echo (09/19/2016): EF 60-65%. No source of embolus  LDL 115  HgbA1c 5.9  SCDs for VTE prophylaxis Diet Heart Room service appropriate? Yes; Fluid consistency: Thin  No antithrombotic prior to admission, now on No antithrombotic. Dr. Tana Coast discussed with Dr. Saintclair Halsted, will initiate eliquis for stroke prevention.  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, brain and  esophagus  Oncology follow-up   Recent intracranial tumor debulking surgery   Follow-up with neurosurgery   Whole brain radiation as outpatient as scheduled   PAF  Not on before meals due to recent intracranial debulking surgery  Will initiate eliquis this time  Hypertension  Stable  Permissive hypertension (OK if < 180/105) but gradually normalize in 5-7 days  Long-term BP goal normotensive  Hyperlipidemia  Home meds: atorvastatin 20 mg PO daily, esumed in hospital  LDL 115, goal < 70  Increase statin dose to atorvastatin 40 mg PO daily  Continue statin at discharge  Other Stroke Risk Factors  Advanced age  ETOH use, advised to drink no more than 1 drink(s) a day  Other Active Problems  Hyponatremia  Leukocytosis  Hospital day # 1  Neurology will sign off. Please call with questions. Pt will follow up with Cecille Rubin, NP, at [facility] in about 6 weeks. Thanks for the consult.  Rosalin Hawking, MD PhD Stroke Neurology 10/23/2016 4:18 PM   To contact Stroke Continuity provider, please refer to [email or web address] After hours, contact General Neurology

## 2016-10-24 ENCOUNTER — Telehealth: Payer: Self-pay | Admitting: Radiation Oncology

## 2016-10-24 ENCOUNTER — Telehealth: Payer: Self-pay | Admitting: *Deleted

## 2016-10-24 DIAGNOSIS — I639 Cerebral infarction, unspecified: Secondary | ICD-10-CM

## 2016-10-24 LAB — BASIC METABOLIC PANEL
Anion gap: 9 (ref 5–15)
BUN: 14 mg/dL (ref 6–20)
CO2: 23 mmol/L (ref 22–32)
Calcium: 8.8 mg/dL — ABNORMAL LOW (ref 8.9–10.3)
Chloride: 97 mmol/L — ABNORMAL LOW (ref 101–111)
Creatinine, Ser: 0.55 mg/dL (ref 0.44–1.00)
GFR calc Af Amer: >60 mL/min (ref >60)
GLUCOSE: 132 mg/dL — AB (ref 65–99)
POTASSIUM: 3.8 mmol/L (ref 3.5–5.1)
Sodium: 129 mmol/L — ABNORMAL LOW (ref 135–145)

## 2016-10-24 MED ORDER — LEVOFLOXACIN IN D5W 750 MG/150ML IV SOLN
750.0000 mg | INTRAVENOUS | Status: DC
  Administered 2016-10-24 – 2016-10-27 (×3): 750 mg via INTRAVENOUS
  Filled 2016-10-24 (×4): qty 150

## 2016-10-24 MED ORDER — FUROSEMIDE 10 MG/ML IJ SOLN
40.0000 mg | Freq: Two times a day (BID) | INTRAMUSCULAR | Status: DC
  Administered 2016-10-24 – 2016-10-26 (×4): 40 mg via INTRAVENOUS
  Filled 2016-10-24 (×4): qty 4

## 2016-10-24 MED ORDER — APIXABAN 5 MG PO TABS
5.0000 mg | ORAL_TABLET | Freq: Two times a day (BID) | ORAL | Status: DC
  Administered 2016-10-24 (×2): 5 mg via ORAL
  Filled 2016-10-24 (×2): qty 1

## 2016-10-24 NOTE — Telephone Encounter (Signed)
Debbie ,duaghter of patient called, patient admitted to [facility]  5 mid [place] bed 5, small strokes, hospital ist  Wants either Dr. Lisbeth Renshaw or Bryson Ha to call , patient will be there several days then going to rehab at [facility], daughter would like the PA to call her back [phone] 10:48 AM

## 2016-10-24 NOTE — Progress Notes (Signed)
Patient ID: Deanna Washington, female   DOB: Apr 18, 1940, 76 y.o.   MRN: [id number] Patient overall has no complaints this afternoon feels like she is distended with inability to void with a little bit of abdominal pain denies any significant headache says her breathing is overall about the same.  Patient is afebrile vital signs appear to be stable however patient does look like she is still short of breath. Abdomen is soft nontender. His flap is still soft with small pseudomeningocele  I discussed with the nurse continuous pulse oximetry instructed the when she looks at up the reading is located notify primary medical service and admission asked them to bladder scan her again the see if she is retaining and if she still retains a high amount to place a Foley catheter.

## 2016-10-24 NOTE — Progress Notes (Signed)
Dr. Lisbeth Renshaw and I were informed by Dr. Saintclair Halsted of the patient's hospitalization late yesterday afternoon and new changes seen on imaging concerning for CVA. Patient is currently admitted Deanna Washington is likely going to be in skilled rehabilitation following this admission. Her whole brain radiation is scheduled to begin on Monday 10/28/2016,and we will defer this start date and reassess the following week. I will contact the patient's daughter as well to let her know of our recommendations.      Carola Rhine, PAC

## 2016-10-24 NOTE — Progress Notes (Signed)
PT Cancellation Note  Patient Details Name: Deanna Washington MRN: [id number] DOB: November 20, 1940   Cancelled Treatment:    Reason Eval/Treat Not Completed: Patient declined, no reason specified. Pt reported that she did not get any sleep last night and is too tired to participate in therapy. PT will continue to f/u as available.    [place] 10/24/2016, 11:59 AM

## 2016-10-24 NOTE — Progress Notes (Signed)
I met with pt and daughter, Deanna Washington, at bedside to discuss her preference for rehab venues. She prefers inpt rehab, but today states she has not slept, is tired from the IV lasix and she does not feel she is ready to d/c today. I will follow up tomorrow for admission Friday. Patient is requesting an overlay mattress and a sleeping pill. I will discuss with her nurse. [phone]

## 2016-10-24 NOTE — Progress Notes (Signed)
SLP Cancellation Note  Patient Details Name: Deanna Washington MRN: [id number] DOB: Mar 08, 1940   Cancelled treatment:       Reason Eval/Treat Not Completed: Other (comment) (pt on bedpan, declining therapies due to fatigue)   Wayman Hoard, Selinda Orion 10/24/2016, 12:04 PM

## 2016-10-24 NOTE — Progress Notes (Signed)
ANTICOAGULATION CONSULT NOTE - Initial Consult  Pharmacy Consult for apixaban Indication: atrial fibrillation and stroke  No Known Allergies  Patient Measurements: Height: 5\' 8"  (172.7 cm) Weight: 160 lb 8 oz (72.8 kg) IBW/kg (Calculated) : 63.9  Vital Signs: Temp: 97.9 F (36.6 C) (09/27 0519) Temp Source: Oral (09/27 0519) BP: 146/90 (09/27 0519) Pulse Rate: 67 (09/27 0902)  Labs:  Recent Labs  10/18/2016 1548 10/24/16 0527  HGB 15.5*  --   HCT 44.3  --   PLT 335  --   CREATININE 0.67 0.55    Estimated Creatinine Clearance: 62.2 mL/min (by C-G formula based on SCr of 0.55 mg/dL).   Medical History: Past Medical History:  Diagnosis Date  . Asthma   . 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 Breast cancer [facility])    believes it was in 2010  . HTN (hypertension)   . Metastatic cancer to lung [facility]) dx'd 08/2016    Medications:  Scheduled:  . acetaZOLAMIDE  250 mg Oral BID  . amiodarone  100 mg Oral BID  . apixaban  5 mg Oral BID  . atorvastatin  40 mg Oral Daily  . dexamethasone  4 mg Oral Q1400  . dexamethasone  8 mg Oral BID  . furosemide  40 mg Intravenous Q breakfast  . levETIRAcetam  500 mg Oral BID  . mometasone-formoterol  2 puff Inhalation BID  . pantoprazole  40 mg Oral Daily    Assessment: 66 yof found to have an acute stroke. Has hx of atrial fibrillation, with no prior anticoagulation due to bleeding risk. Has metastatic breast cancer to the brain. Pharmacy consulted to dose apixaban. Patient is at elevated risk of hemorrhagic complication. CBC stable and no notes indicating bleeding.  Goal of Therapy:  Monitor platelets by anticoagulation protocol: Yes   Plan:  Start apixaban 5 mg BID  Monitor CBC, renal function, and signs/symptoms of bleeding  Doylene Canard, PharmD Clinical Pharmacist  Phone: [phone] 10/24/2016,9:05 AM

## 2016-10-24 NOTE — Progress Notes (Signed)
Portable equipment states there are no available continuous pulse oximeters.  Currently using a dinamap at bedside to monitor O2. Wendee Copp

## 2016-10-24 NOTE — Progress Notes (Signed)
Bladder scan showed 777c in bladder. Paged [facility] for In and Out order

## 2016-10-24 NOTE — Progress Notes (Signed)
[facility]                                                                              Patient Demographics  Deanna Washington, is a 76 y.o. female, DOB - 1940/03/14, POE:[id number]  Admit date - 10/09/2016   Admitting Physician Kinnie Feil, MD  Outpatient Primary MD for the patient is Tisovec, Fransico Him, MD  Outpatient specialists:   LOS - 2  days   Medical records reviewed and are as summarized below:    Chief Complaint  Patient presents with  . [place]  . Lethargic  . Leg Swelling       Brief summary   Deanna Washington is a 76 y.o. 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 Per family: patient developed worsening leg edema, associated with generalized diffuse swelling for several days. She is also noted to have more weakness in her bl legs,  unable to ambulate well. She reported right eye blurry vision for few days. MRI showed acute stroke   Assessment & Plan    Principal Problem:   Acute embolic stroke [facility]): Presented with worsening bilateral weakness in the legs, the setting of recent craniotomy, paroxysmal atrial fibrillation not on anticoagulation - MRI of the brain showed acute subcentimeter right cerebellar infarcts, less likely new metastasis - history of atrial fibrillation not on anticoagulation due to bleeding risk - cleared by neurosurgery for anticoagulation, remains high risk given recent craniotomy and metastatic brain lesions.  However anticoagulation beneficial as etiology of her stroke is likely paroxysmal atrial fibrillation versus hypercoagulable state from malignancy - carotid Dopplers 8/23 showed bilateral 1-39% ICA stenosis - 2-D echo showed EF of 60-65% with grade 1  diastolic dysfunction - discussed with neurology, Dr Erlinda Hong, recommended starting eliquis 5mg  BID, verified with pharmacy  Active Problems:   Metastasis to brain, status post craniotomy for resection of right occipital brain metastasis on 9/6 , seizures - discussed with neurology, recommended neurosurgery consult for anticoagulationif can be cleared at this point given recent craniotomy as etiology of her stroke is likely paroxysmal atrial fibrillation versus hypercoagulable state from malignancy - neurosurgery following. Appreciate radiation oncology for following up and radiation which was scheduled on October 1, will now be deferred until the following - continue dexamethasone, Keppra  bilateral lower extremity edema, Acute on chronic diastolic CHF, dyspnea  - Doppler ultrasound of the lower extremities negative for DVT - verified with the patient and daughter, patient has chronic right lower extremity edema for years but left LE edema new - continue IV Lasix 40 mg, negative balance of 1.2 L, increase Lasix to 40 mg IV every 12 hours - placed on daily weights.  Multifocal pneumonia - CT angiogram chest negative for PE however showed slight worsening of mediastinal metastatic retinopathy, innumerable pulmonary nodules consistent with metastatic disease. Multifocal groundglass densityin the bilateral lungs most notable in the right upper lobe could reflect superimposed multifocal pneumonia,  placed on Levaquin.    Asthma - currently stable, no wheezing    Atrial fibrillation, paroxysmal - currently rate controlled, on amiodarone, continue -placed on eliquis   Code Status: DNR  DVT Prophylaxis:  SCD's Family Communication: Discussed in detail with the patient, all imaging results, lab results explained to the patient   Disposition Plan: CIR if improving tomorrow  Time Spent in minutes  25 minutes  Procedures:    Consultants:   Neurology neurosurgery  Antimicrobials:       Medications  Scheduled Meds: . acetaZOLAMIDE  250 mg Oral BID  . amiodarone  100 mg Oral BID  . apixaban  5 mg Oral BID  . atorvastatin  40 mg Oral Daily  . dexamethasone  4 mg Oral Q1400  . dexamethasone  8 mg Oral BID  . furosemide  40 mg Intravenous Q breakfast  . levETIRAcetam  500 mg Oral BID  . mometasone-formoterol  2 puff Inhalation BID  . pantoprazole  40 mg Oral Daily   Continuous Infusions: . levofloxacin (LEVAQUIN) IV 750 mg (10/24/16 1032)   PRN Meds:.acetaminophen **OR** acetaminophen (TYLENOL) oral liquid 160 mg/5 mL **OR** acetaminophen, albuterol, hydrALAZINE, LORazepam   Antibiotics   Anti-infectives    Start     Dose/Rate Route Frequency Ordered Stop   10/24/16 1000  levofloxacin (LEVAQUIN) IVPB 750 mg     750 mg 100 mL/hr over 90 Minutes Intravenous Every 24 hours 10/24/16 0831          Subjective:   Deanna Washington was seen and examined today.  Feeling better today, breathing improving. No headache or blurry vision. Patient denies dizziness, chest pain, shortness of breath, abdominal pain, N/V/D.  No acute events overnight.    Objective:   Vitals:   10/24/16 0143 10/24/16 0519 10/24/16 0902 10/24/16 1011  BP: (!) 144/88 (!) 146/90  (!) 149/82  Pulse: 91 88 67 77  Resp: 20 18 18 19   Temp: 98 F (36.7 C) 97.9 F (36.6 C)  98 F (36.7 C)  TempSrc: Oral Oral  Oral  SpO2: 96% 96% 96% 98%  Weight:      Height:        Intake/Output Summary (Last 24 hours) at 10/24/16 1420 Last data filed at 10/24/16 1130  Gross per 24 hour  Intake              360 ml  Output             1800 ml  Net            -1440 ml     Wt Readings from Last 3 Encounters:  10/17/2016 72.8 kg (160 lb 8 oz)  10/15/2016 77.6 kg (171 lb)  10/03/16 77.6 kg (171 lb)     Exam  General: Alert and oriented x 3, NAD Eyes:  HEENT:  Atraumatic, normocephalic Cardiovascular: S1 S2 auscultated, no rubs, murmurs or gallops. Regular rate and rhythm. 3+ pedal edema  b/l Respiratory: decreased breath sounds at the bases Gastrointestinal: Soft, nontender, nondistended, + bowel sounds Ext: 3+  pedal edema bilaterally Neuro: no new deficits Musculoskeletal: No digital cyanosis, clubbing Skin: No rashes Psych: Normal affect and demeanor, alert and oriented x3      Data Reviewed:  I have personally reviewed following labs and imaging studies  Micro Results No results found for this or any previous visit (from the past 240 hour(s)).  Radiology Reports Ct Head Wo Contrast  Result Date: 10/20/2016 CLINICAL DATA:  Expressive aphasia, lethargy,  difficulty standing, LEFT peripheral vision loss for 2 days. History of metastatic breast cancer on chemo radiation. Status post debulking of RIGHT occipital tumor. EXAM: CT HEAD WITHOUT CONTRAST TECHNIQUE: Contiguous axial images were obtained from the base of the skull through the vertex without intravenous contrast. COMPARISON:  MRI of the head October 04, 2016 and CT HEAD September 18, 2016 FINDINGS: BRAIN: RIGHT occipital resection cavity, re-expanded RIGHT occipital horn. No significant residual mass effect, surrounding low-density encephalomalacia. Moderate parenchymal brain volume loss for age. Patchy supratentorial white matter hypodensities, some of which reflect metastatic disease characterized on prior MRI. Subcentimeter LEFT temporal lobe metastasis better seen on prior MRI. No acute large vascular territory infarct. No intraparenchymal hemorrhage. No extra-axial fluid collections. Basal cisterns are patent. VASCULAR: Mild to moderate calcific atherosclerosis of the carotid siphons. SKULL: Status post RIGHT parieto-occipital craniotomy. RIGHT calvarial suspected metastasis better characterized on prior MRI. RIGHT parieto-occipital scalp fluid collection measuring 6.2 x 1.4 cm. Borderline partially empty sella. SINUSES/ORBITS: Mild paranasal sinus mucosal thickening without air-fluid levels. Mastoid air cells are well  aerated. The included ocular globes and orbital contents are non-suspicious. Status post bilateral ocular lens implants. OTHER: None. IMPRESSION: 1. Interval RIGHT craniotomy for debulking occipital lobe tumor, improved appearance with decreased mass effect and re-expanded RIGHT occipital horn. 2. 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 3. Moderate white matter changes consistent chronic small vessel ischemic disease and known metastasis. Electronically Signed   By: Elon Alas M.D.   On: 10/01/2016 16:47   Ct Angio Chest Pe W Or Wo Contrast  Result Date: 10/23/2016 CLINICAL DATA:  Short of breath, recent surgery EXAM: CT ANGIOGRAPHY CHEST WITH CONTRAST TECHNIQUE: Multidetector CT imaging of the chest was performed using the standard protocol during bolus administration of intravenous contrast. Multiplanar CT image reconstructions and MIPs were obtained to evaluate the vascular anatomy. CONTRAST:  75 mL Isovue 370 intravenous COMPARISON:  10/09/2016, CT chest 09/19/2016 FINDINGS: Cardiovascular: Satisfactory opacification of the pulmonary arteries to the segmental level. No evidence of pulmonary embolism. Ectatic ascending aorta, measuring up to 3.7 cm. No dissection is seen. Atherosclerotic calcifications. Enlarged pulmonary trunk, measuring up to 3.9 cm. Coronary artery calcification. Borderline cardiomegaly. No significant pericardial effusion. Mediastinum/Nodes: Re- demonstrated mediastinal adenopathy with increased size of previously noted abnormal lymph nodes. For example AP window soft tissue mass measures 3.9 x 2.3 cm, compared with 3.4 x 2.1 cm previously. Subcarinal mass measures 2.7 cm compared with 2.5 cm previously. Similar appearance of small right hilar nodes. Midline trachea. No thyroid mass. Esophagus unremarkable. Lungs/Pleura: Interim development of multifocal ground-glass densities throughout the lungs, most notable in the  right upper lobe. Multiple bilateral pulmonary nodules, also worrisome for metastatic disease with interval increase in size of several previously noted lesions, for example right middle lobe mass measures 2.2 x 1.6 cm, compared with 1.1 x 1.2 cm previously. A right central lung base mass measures 1.8 x 1.3 cm, compared with 1.5 x 1.2 cm previously. Right lower lobe/azygoesophageal mass measures 4.3 x 3 cm compared with 4.2 x 3.1 cm. Interval increase in multiple small pulmonary nodules in the right lower lobe and middle lobes. Increased contiguity of multiple left lower lobe pulmonary nodules. No pleural effusion or pneumothorax. Upper Abdomen: No acute abnormality. Musculoskeletal: Stable small sclerotic foci in T1 and T3. Review of the MIP images confirms the above findings. IMPRESSION: 1. Negative for acute pulmonary embolus or aortic dissection 2. Slight worsening of mediastinal metastatic adenopathy concerning  for disease progression 3. Innumerable pulmonary nodules consistent with metastatic disease, increased number of nodules and size of pre-existing nodules also concerning for progression of disease. Interim development of multifocal ground-glass density within the bilateral lungs most notable in the right upper lobe, could reflect superimposed multifocal pneumonia. Aortic Atherosclerosis (ICD10-I70.0). Electronically Signed   By: Donavan Foil M.D.   On: 10/23/2016 20:27   Mr Brain Wo Contrast (neuro Protocol)  Result Date: 10/05/2016 CLINICAL DATA:  Altered level consciousness. History of metastatic breast cancer. Status post recent bulking of RIGHT occipital lobe metastasis. EXAM: MRI HEAD WITHOUT CONTRAST TECHNIQUE: Multiplanar, multiecho pulse sequences of the brain and surrounding structures were obtained without intravenous contrast. COMPARISON:  CT HEAD October 22, 2016 at 1612 hours and MRI of the head October 04, 2016 and August 23rd 2018 and FINDINGS: BRAIN: Multiple new subcentimeter  foci of reduced diffusion RIGHT cerebellum with low ADC values. Stable subcentimeter reduced diffusion and splenium of corpus callosum and RIGHT medial parietal lobe without ADC abnormality, possible mild nonenhancing tumor. Increasing fourth ventricle periventricular FLAIR T2 hyperintensities. Re- demonstration of LEFT cerebellar metastasis. Status post debulking of RIGHT occipital lobe tumor, with surrounding presumable gliosis and, potential residual tumor without mass effect. Susceptibility artifact within and along the margin of the resection cavity. No midline shift. Re-expanded RIGHT occipital horn and RIGHT atrium. No hydrocephalus. Similar patchy to confluent supratentorial white matter FLAIR T2 hyperintensities. LEFT parietal cortical T2 hyperintensity corresponding to known metastasis. No abnormal extra-axial fluid collections. Focal dural thickening subjacent to craniotomy. VASCULAR: Normal major intracranial vascular flow voids present at skull base. SKULL AND UPPER CERVICAL SPINE: Scattered mildly expansile T2 bright foci within RIGHT calvarial diploic space concerning for metastatic disease. Status post RIGHT parieto-occipital craniotomy. Partially empty sella. The Craniocervical junction maintained. SINUSES/ORBITS: Trace mastoid effusions. Mild paranasal sinus mucosal thickening without air-fluid levels. The included ocular globes and orbital contents are non-suspicious. Status post bilateral ocular lens implants. OTHER: Larger RIGHT parietoccipital T2 bright fluid collection with susceptibility artifact overlying craniotomy, predominately of loosening signal on FLAIR sequence. IMPRESSION: 1. Acute subcentimeter RIGHT cerebellar infarcts, less likely new metastasis. 2. Interval debulking of RIGHT occipital tumor with improved appearance, residual edema and potential tumor ; limited assessment without contrast. No residual mass effect or midline shift. 3. 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 4. Additional intracranial metastasis better characterized on prior MRI. 5. Moderate chronic small vessel ischemic disease. Electronically Signed   By: Elon Alas M.D.   On: 10/08/2016 17:27   Mr Jeri Cos WU Contrast  Result Date: 10/04/2016 CLINICAL DATA:  76 year old female postoperative day 1 stereotactic subtotal resection of a large right occipital brain tumor. Preliminary pathology suggesting breast cancer metastasis. Intraoperative evidence of tumor extension to the trigone of the lateral ventricle. EXAM: MRI HEAD WITHOUT AND WITH CONTRAST TECHNIQUE: Multiplanar, multiecho pulse sequences of the brain and surrounding structures were obtained without and with intravenous contrast. CONTRAST:  6mL MULTIHANCE GADOBENATE DIMEGLUMINE 529 MG/ML IV SOLN COMPARISON:  Preoperative brain MRI 09/28/2016 and earlier. FINDINGS: Brain: Right posterior paramidline craniotomy site with underlying small extra-axial air in fluid collection up to 11 mm in thickness. Subjacent resection cavity with a combination of fluid and gas. The resection cavity extends to the atrium of the right lateral ventricle just posterior to the choroid plexus. There is pneumoventricle, with gas in both frontal horns, but no ventriculomegaly. Small volume pneumocephalus along the right frontal convexity. A small peninsula of residual brain parenchyma along the  superior aspect of the resection cavity demonstrates restricted diffusion (series 3, image 28). There otherwise several small foci of restricted along the right splenium of the corpus callosum diffusion. Postcontrast enhancement at the resection cavity is nodular and curvilinear along the anterior margin and junction with the atrium of the right lateral ventricle, best demonstrated on series 11, image 27, series 12, image 8 and series 13, image 8. Mild surrounding T2 and FLAIR hyperintensity with largely resolved mass effect on the adjacent  ventricle. Stable 10 mm enhancing left cerebellar metastasis on series 11, image 9. Continued confluent abnormal enhancement in both internal auditory canals, more so the left (series 11, images 12 and 13). Stable to slightly decreased punctate and curvilinear foci of enhancement along the left parietal lobe which are favored to be post ischemic rather than metastatic. No other enhancement suspicious for metastatic disease. Scattered nonspecific cerebral white matter T2 and FLAIR hyperintensity appears stable. No restricted diffusion to suggest acute infarction. No midline shift. Basilar cisterns remain patent. Cervicomedullary junction and pituitary are within normal limits. Vascular: Major intracranial vascular flow voids are stable aside from some extrinsic mass effect on the superior sagittal sinus near the craniectomy site. Skull and upper cervical spine: Negative visualized cervical spine. Stable heterogeneous calvarium bone marrow signal. Bone marrow signal at the skullbase and in the visible cervical spine remains normal. Sinuses/Orbits: Stable and negative orbits soft tissues. Mild paranasal sinus mucosal thickening is stable. Stable trace left mastoid effusion. Other: Postoperative changes to the superior and posterior scalp soft tissues including small scalp hematoma. IMPRESSION: 1. Significant debulking of the large right occipital lobe tumor. Small volume of residual suspicious enhancement along the junction of the anterior resection cavity and atrium of the right lateral ventricle. Several small areas of postoperative restricted diffusion as seen on series 3. 2. Stable appearance of presumed metastatic disease in the left cerebellum and both internal auditory canals. No new leptomeningeal disease or brain metastasis identified. 3. Multiple punctate and small linear foci of cortical enhancement in the left parietal and posterior frontal lobes are still favored to be post ischemic in nature, but will  require MRI surveillance. Electronically Signed   By: Genevie Ann M.D.   On: 10/04/2016 12:47   Mr Jeri Cos TI Contrast  Result Date: 09/28/2016 CLINICAL DATA:  76 year old female with metastatic disease to the brain and in the chest. Subsequent encounter. EXAM: MRI HEAD WITHOUT AND WITH CONTRAST TECHNIQUE: Multiplanar, multiecho pulse sequences of the brain and surrounding structures were obtained without and with intravenous contrast. CONTRAST:  1mL MULTIHANCE GADOBENATE DIMEGLUMINE 529 MG/ML IV SOLN COMPARISON:  Brain MRI 09/19/2016. FINDINGS: Brain: Large heterogeneously enhancing right occipital lobe mass with extension to the atrium of the right lateral ventricle today encompasses 70 x 48 by 69 mm (AP by transverse by CC) versus 72 x 50 x 70 mm previously at a comparable level. Note that there is stable conspicuous increased enhancement at the ependyma of the posterior right lateral ventricle (series 11, image 96). Regional mass effect including on the posterior right lateral ventricle appears stable. 10 mm central metastasis in the left cerebellar hemisphere is stable in size. No regional mass effect. There are multiple small cortically based areas of enhancement in the left occipital pole which appear to correspond to the small diffusion restricted areas thought to be ischemia on 09/19/2016 (e.g. Series 11, image 53). Of note, Of note, there are also multiple similar small cortically based areas of enhancement in the left parietal lobe, and  these are in proximity to the small round cortically based enhancement which is stable on series 11, image 107 and which was thought to be metastatic on 09/19/2016. Evidence of new abnormal internal auditory canal enhancement left greater than right (series 11, images 43 and 44). This is also visible on the coronal and sagittal post-contrast images. No no other intracranial metastatic disease identified. No leptomeningeal enhancement identified elsewhere. There is a new  small focus of restricted diffusion in the left superolateral frontal lobe on series 5, image 44) without enhancement). There is also an increased focus of restricted diffusion in the central splenium of the corpus callosum on series 5, image 32 (no enhancement). No other ischemic related restricted diffusion. Stable intracranial mass effect and scattered nonenhancing nonspecific bilateral cerebral white matter T2 and FLAIR hyperintense foci. No acute intracranial hemorrhage identified. Negative pituitary and cervicomedullary junction. Vascular: Major intracranial vascular flow voids are stable. Skull and upper cervical spine: Indeterminate 15 mm enhancing right posterior calvarium lesion appears stable on series 11, image 100. There are additional smaller right calvarial enhancing lesions (such as on series 11, image 117. Negative visualized cervical spine and spinal cord. Sinuses/Orbits: Stable and negative. Other: Mastoids remain clear.  Negative scalp soft tissues. IMPRESSION: 1. Large 7 cm right occipital lobe enhancing mass with associated mass effect is stable since 09/19/2016. Note associated posterior right lateral ventricle ependymal enhancement suspicious for extension along the ventricle. 2. New abnormal bilateral Internal Auditory Canal enhancement highly suspicious for leptomeningeal metastatic disease, although no other leptomeningeal enhancement is identified. 3. 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 4. Multiple small cortically based areas of enhancement along the posterior left hemisphere appear to be post ischemic. This raises the possibility that the punctate enhancement (presumed third brain metastasis on 09/20/2015) in the left parietal lobe is instead post-ischemic. 5. New small acute lacunar infarct in the posterior left frontal lobe (series 5, image 44). Electronically Signed   By: Genevie Ann M.D.   On: 09/28/2016 18:14   Dg Chest Port 1 View  Result Date: 10/16/2016 CLINICAL DATA:   Bilateral lower extremity swelling. EXAM: PORTABLE CHEST 1 VIEW COMPARISON:  09/18/2016. FINDINGS: Mediastinum and hilar structures normal. Cardiomegaly with mild bilateral interstitial prominence and small bilateral pleural effusions findings consistent with congestive heart failure. Surgical clips right chest. IMPRESSION: Congestive heart failure with bilateral pulmonary interstitial edema and small bilateral pleural effusions similar findings noted on prior study of 09/18/2016. Electronically Signed   By: Marcello Moores  Register   On: 10/03/2016 15:22    Lab Data:  CBC:  Recent Labs Lab 10/07/2016 1548  WBC 14.0*  NEUTROABS 12.7*  HGB 15.5*  HCT 44.3  MCV 90.8  PLT 536   Basic Metabolic Panel:  Recent Labs Lab 10/12/2016 1548 10/24/16 0527  NA 132* 129*  K 4.0 3.8  CL 96* 97*  CO2 26 23  GLUCOSE 127* 132*  BUN 19 14  CREATININE 0.67 0.55  CALCIUM 8.6* 8.8*   GFR: Estimated Creatinine Clearance: 62.2 mL/min (by C-G formula based on SCr of 0.55 mg/dL). Liver Function Tests:  Recent Labs Lab 10/11/2016 1548  AST 25  ALT 67*  ALKPHOS 94  BILITOT 0.6  PROT 5.8*  ALBUMIN 3.2*   No results for input(s): LIPASE, AMYLASE in the last 168 hours. No results for input(s): AMMONIA in the last 168 hours. Coagulation Profile: No results for input(s): INR, PROTIME in the last 168 hours. Cardiac Enzymes: No results for input(s): CKTOTAL, CKMB, CKMBINDEX, TROPONINI in the  last 168 hours. BNP (last 3 results) No results for input(s): PROBNP in the last 8760 hours. HbA1C:  Recent Labs  10/23/16 0520  HGBA1C 5.9*   CBG:  Recent Labs Lab 10/19/2016 1555  GLUCAP 128*   Lipid Profile:  Recent Labs  10/23/16 0520  CHOL 205*  HDL 72  LDLCALC 115*  TRIG 89  CHOLHDL 2.8   Thyroid Function Tests: No results for input(s): TSH, T4TOTAL, FREET4, T3FREE, THYROIDAB in the last 72 hours. Anemia Panel: No results for input(s): VITAMINB12, FOLATE, FERRITIN, TIBC, IRON, RETICCTPCT in  the last 72 hours. Urine analysis:    Component Value Date/Time   COLORURINE YELLOW 10/14/2016 1548   APPEARANCEUR HAZY (A) 10/10/2016 1548   LABSPEC 1.010 10/21/2016 1548   PHURINE 7.0 10/21/2016 [place] 09/30/2016 [place] 10/20/2016 [place] 10/21/2016 1548   KETONESUR NEGATIVE 10/16/2016 1548   PROTEINUR NEGATIVE 10/21/2016 1548   NITRITE NEGATIVE 10/09/2016 1548   LEUKOCYTESUR NEGATIVE 10/21/2016 1548     Ripudeep Rai M.D. [facility] 10/24/2016, 2:20 PM  Pager: [phone] Between 7am to 7pm - call Pager - [phone]  After 7pm go to [email or web address] - password [email or web address]  Call night coverage person covering after 7pm

## 2016-10-25 ENCOUNTER — Inpatient Hospital Stay (HOSPITAL_COMMUNITY): Payer: Medicare Other

## 2016-10-25 ENCOUNTER — Ambulatory Visit: Payer: Medicare Other | Admitting: Physician Assistant

## 2016-10-25 DIAGNOSIS — G9782 Other postprocedural complications and disorders of nervous system: Secondary | ICD-10-CM

## 2016-10-25 DIAGNOSIS — G9600 Cerebrospinal fluid leak, unspecified: Secondary | ICD-10-CM

## 2016-10-25 LAB — BASIC METABOLIC PANEL
Anion gap: 11 (ref 5–15)
BUN: 19 mg/dL (ref 6–20)
CALCIUM: 9 mg/dL (ref 8.9–10.3)
CO2: 23 mmol/L (ref 22–32)
CREATININE: 0.71 mg/dL (ref 0.44–1.00)
Chloride: 95 mmol/L — ABNORMAL LOW (ref 101–111)
GLUCOSE: 129 mg/dL — AB (ref 65–99)
Potassium: 3.6 mmol/L (ref 3.5–5.1)
Sodium: 129 mmol/L — ABNORMAL LOW (ref 135–145)

## 2016-10-25 LAB — MRSA PCR SCREENING: MRSA BY PCR: NEGATIVE

## 2016-10-25 MED ORDER — MORPHINE SULFATE (PF) 2 MG/ML IV SOLN
1.0000 mg | INTRAVENOUS | Status: DC | PRN
  Administered 2016-10-25: 2 mg via INTRAVENOUS
  Filled 2016-10-25: qty 1

## 2016-10-25 MED ORDER — TRAMADOL HCL 50 MG PO TABS
50.0000 mg | ORAL_TABLET | Freq: Four times a day (QID) | ORAL | Status: DC
  Administered 2016-10-25 – 2016-10-26 (×3): 100 mg via ORAL
  Filled 2016-10-25 (×3): qty 2

## 2016-10-25 MED ORDER — ONDANSETRON HCL 4 MG/2ML IJ SOLN
INTRAMUSCULAR | Status: AC
  Administered 2016-10-25: 4 mg
  Filled 2016-10-25: qty 2

## 2016-10-25 MED ORDER — WHITE PETROLATUM GEL
Status: AC
  Administered 2016-10-25: 07:00:00
  Filled 2016-10-25: qty 1

## 2016-10-25 MED ORDER — HYDROCODONE-ACETAMINOPHEN 5-325 MG PO TABS: 1.0000 | ORAL_TABLET | ORAL | Status: DC | PRN

## 2016-10-25 MED ORDER — CALCIUM CARBONATE ANTACID 500 MG PO CHEW
1.0000 | CHEWABLE_TABLET | Freq: Once | ORAL | Status: AC
  Administered 2016-10-25: 200 mg via ORAL
  Filled 2016-10-25: qty 1

## 2016-10-25 MED ORDER — ONDANSETRON HCL 4 MG/2ML IJ SOLN
4.0000 mg | INTRAMUSCULAR | Status: DC | PRN
  Administered 2016-10-26: 4 mg via INTRAVENOUS
  Filled 2016-10-25 (×2): qty 2

## 2016-10-25 NOTE — Progress Notes (Signed)
Noted medical issues and plans to transfer to stepdown. I will follow. [phone]

## 2016-10-25 NOTE — Progress Notes (Signed)
Called at 0720 to see patient with mental status changes.  On arrival patient lying on side in bed.  Easily arousable but sleepy.  Moves all extremities - follows commands - complains only of headache and pain right side of head at flap site.  Large area of swelling noted in right occipital area - area soft.  Daughter present in room states much worse that yesterday - RN Nira Conn present - agrees.  VSS - resps regular and unlabored - O2 sats 96% on RA.  Dr. Tana Coast present - speaking with neurosurgery.  Taken to CT scan stat - tol well.  Returned to room - NP for neuro present.  Patient remains as described.  Handoff to [employer] - patient for transfer to SDU for closer monitoring.  Will follow.

## 2016-10-25 NOTE — Care Management Note (Signed)
Case Management Note  Patient Details  Name: Deanna Washington MRN: [id number] Date of Birth: 03-18-1940  Subjective/Objective:                    Action/Plan: Plan is for patient to transfer to step down today. CM following.   Expected Discharge Date:   (unknown)               Expected Discharge Plan:  IP Rehab Facility  In-House Referral:  Clinical Social Work  Discharge planning Services  CM Consult  Post Acute Care Choice:    Choice offered to:     DME Arranged:    DME Agency:     HH Arranged:    [facility] Agency:     Status of Service:  In process, will continue to follow  If discussed at Long Length of Stay Meetings, dates discussed:    Additional Comments:  Pollie Friar, RN 10/25/2016, 3:03 PM

## 2016-10-25 NOTE — Progress Notes (Signed)
[facility]                                                                              Patient Demographics  Deanna Washington, is a 76 y.o. female, DOB - Nov 11, 1940, OEU:[id number]  Admit date - 10/20/2016   Admitting Physician Kinnie Feil, MD  Outpatient Primary MD for the patient is Tisovec, Fransico Him, MD  Outpatient specialists:   LOS - 3  days   Medical records reviewed and are as summarized below:    Chief Complaint  Patient presents with  . [facility]  . Lethargic  . Leg Swelling       Brief summary   Deanna Washington is a 76 y.o. 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 Per family: patient developed worsening leg edema, associated with generalized diffuse swelling for several days. She is also noted to have more weakness in her bl legs,  unable to ambulate well. She reported right eye blurry vision for few days. MRI showed acute stroke   Assessment & Plan    Principal Problem:   Acute embolic stroke [facility]): Presented with worsening bilateral weakness in the legs, the setting of recent craniotomy, paroxysmal atrial fibrillation not on anticoagulation - MRI of the brain showed acute subcentimeter right cerebellar infarcts, less likely new metastasis - history of atrial fibrillation not on anticoagulation due to bleeding risk - carotid Dopplers 8/23 showed bilateral 1-39% ICA stenosis - 2-D echo showed EF of 60-65% with grade 1 diastolic dysfunction - After discussion with neurology and cleared by neurosurgery, patient was started on eliquis on 9/27, now placed on hold  Active problems Acute encephalopathy, meningocele - called in the morning ~ 7:20AM to evaluate the patient due to  changes in her mental status. The patient was noted to be lethargic, complaining of headache, no new neurological deficits on my examination. Daughter at the bedside reported increase in "brain swelling", visibly worse from yesterday - Stat CT head was obtained,which showed progressive subgaleal fluid collection around the right occipital bone. Presumingly CSF, no acute intracranial findings. - discussed with neurosurgery, Dr. Saintclair Halsted, currently not available, have notified Dr. Cyndy Freeze to evaluate the patient - continue neuro checks, seizure precautions, further recommendations per neurosurgery, awaiting transfer to stepdown bed     Metastasis to brain, status post craniotomy for resection of right occipital brain metastasis on 9/6 , seizures - discussed with neurology, recommended neurosurgery consult for anticoagulationif can be cleared at this point given recent craniotomy as etiology of her stroke is likely paroxysmal atrial fibrillation versus hypercoagulable state from malignancy - neurosurgery following. Appreciate radiation oncology for following up and radiation which was scheduled on October 1, will now be deferred until the following - continue dexamethasone, Keppra  bilateral lower extremity edema, Acute on chronic diastolic CHF, dyspnea  - Doppler ultrasound of the lower extremities negative for DVT - verified with the patient and daughter, patient has chronic right  lower extremity edema for years but left LE edema new - peripheral edema improving, continue IV Lasix negative balance of 4.2 L - placed on daily weights.  Multifocal pneumonia - CT angiogram chest negative for PE however showed slight worsening of mediastinal metastatic retinopathy, innumerable pulmonary nodules consistent with metastatic disease. Multifocal groundglass densityin the bilateral lungs most notable in the right upper lobe could reflect superimposed multifocal pneumonia, placed on Levaquin.    Asthma - currently  stable, no wheezing    Atrial fibrillation, paroxysmal - currently rate controlled, on amiodarone, continue -placed on eliquis   Code Status: DNR  DVT Prophylaxis:  SCD's Family Communication: Discussed in detail with the patient, all imaging results, lab results explained to the patient and daughter at the bedside in detail  Disposition Plan: transfer to stepdown  Time Spent in minutes  25 minutes  Procedures:    Consultants:   Neurology neurosurgery  Antimicrobials:      Medications  Scheduled Meds: . acetaZOLAMIDE  250 mg Oral BID  . amiodarone  100 mg Oral BID  . atorvastatin  40 mg Oral Daily  . dexamethasone  4 mg Oral Q1400  . dexamethasone  8 mg Oral BID  . furosemide  40 mg Intravenous Q12H  . levETIRAcetam  500 mg Oral BID  . mometasone-formoterol  2 puff Inhalation BID  . pantoprazole  40 mg Oral Daily  . traMADol  50-100 mg Oral Q6H  . white petrolatum       Continuous Infusions: . levofloxacin (LEVAQUIN) IV Stopped (10/24/16 1202)   PRN Meds:.acetaminophen **OR** acetaminophen (TYLENOL) oral liquid 160 mg/5 mL **OR** acetaminophen, albuterol, hydrALAZINE, LORazepam, ondansetron (ZOFRAN) IV   Antibiotics   Anti-infectives    Start     Dose/Rate Route Frequency Ordered Stop   10/24/16 1000  levofloxacin (LEVAQUIN) IVPB 750 mg     750 mg 100 mL/hr over 90 Minutes Intravenous Every 24 hours 10/24/16 0831          Subjective:   Deanna Washington was seen and examined today. complaining of headache, lethargy, no blurry vision or any neurological deficits. Patient denies dizziness, chest pain, shortness of breath, abdominal pain, N/V/D.    Objective:   Vitals:   10/25/16 0511 10/25/16 0739 10/25/16 0859 10/25/16 0916  BP: (!) 144/89 (!) 153/94 (!) 160/94   Pulse: 77 97 68   Resp: 20 18 16    Temp: 98 F (36.7 C) 98.1 F (36.7 C) 98 F (36.7 C)   TempSrc: Oral Oral Oral   SpO2: 98% 97% 94% 96%  Weight:      Height:        Intake/Output  Summary (Last 24 hours) at 10/25/16 1029 Last data filed at 10/25/16 0511  Gross per 24 hour  Intake                0 ml  Output             3200 ml  Net            -3200 ml     Wt Readings from Last 3 Encounters:  10/25/16 74.3 kg (163 lb 11.2 oz)  09/28/2016 77.6 kg (171 lb)  10/03/16 77.6 kg (171 lb)     Exam   General: Alert and oriented x 3, NAD  Eyes: PERRLA, EOMI  HEENT: right sided swelling   Cardiovascular: S1 S2 auscultated, no rubs, murmurs or gallops. Regular rate and rhythm. 2+ pedal edema b/l  Respiratory: Clear to auscultation  bilaterally, no wheezing, rales or rhonchi  Gastrointestinal: Soft, nontender, nondistended, + bowel sounds  Ext: 2+ pedal edema bilaterally  Neuro: AAOx3, Cr N's II- XII. Strength 5/5 upper and lower extremities bilaterally, speech clear  Musculoskeletal: No digital cyanosis, clubbing  Skin: No rashes  Psych: Normal affect and demeanor, alert and oriented x3    Data Reviewed:  I have personally reviewed following labs and imaging studies  Micro Results No results found for this or any previous visit (from the past 240 hour(s)).  Radiology Reports Ct Head Wo Contrast  Result Date: 10/25/2016 CLINICAL DATA:  Craniotomy for tumor resection. Increased swelling at operative site. EXAM: CT HEAD WITHOUT CONTRAST TECHNIQUE: Contiguous axial images were obtained from the base of the skull through the vertex without intravenous contrast. COMPARISON:  Brain MRI from 3 days ago FINDINGS: Brain: Low-density resection cavity in the right occipital lobe. There is small extra-axial fluid accumulation without significant mass effect. Progressive low-density subgaleal fluid collection around the bone flap. No evidence of acute infarct, acute hemorrhage, hydrocephalus, or shift. Patient has metastatic and ischemic findings by brain MRI, not visible on this scan Vascular: Atherosclerosis.  No hyperdense vessel. Skull: Stable positioning of right  occipital parietal craniotomy flap. Sinuses/Orbits: No acute finding.  Bilateral cataract resection IMPRESSION: 1. Progressive subgaleal fluid collection around the right occipital bone flap, presumably CSF. 2. No acute intracranial finding. 3. Ischemic and metastatic findings in the brain by recent MRI that are not detectable or progressive by CT. Electronically Signed   By: Monte Fantasia M.D.   On: 10/25/2016 08:12   Ct Head Wo Contrast  Result Date: 10/10/2016 CLINICAL DATA:  Expressive aphasia, lethargy, difficulty standing, LEFT peripheral vision loss for 2 days. History of metastatic breast cancer on chemo radiation. Status post debulking of RIGHT occipital tumor. EXAM: CT HEAD WITHOUT CONTRAST TECHNIQUE: Contiguous axial images were obtained from the base of the skull through the vertex without intravenous contrast. COMPARISON:  MRI of the head October 04, 2016 and CT HEAD September 18, 2016 FINDINGS: BRAIN: RIGHT occipital resection cavity, re-expanded RIGHT occipital horn. No significant residual mass effect, surrounding low-density encephalomalacia. Moderate parenchymal brain volume loss for age. Patchy supratentorial white matter hypodensities, some of which reflect metastatic disease characterized on prior MRI. Subcentimeter LEFT temporal lobe metastasis better seen on prior MRI. No acute large vascular territory infarct. No intraparenchymal hemorrhage. No extra-axial fluid collections. Basal cisterns are patent. VASCULAR: Mild to moderate calcific atherosclerosis of the carotid siphons. SKULL: Status post RIGHT parieto-occipital craniotomy. RIGHT calvarial suspected metastasis better characterized on prior MRI. RIGHT parieto-occipital scalp fluid collection measuring 6.2 x 1.4 cm. Borderline partially empty sella. SINUSES/ORBITS: Mild paranasal sinus mucosal thickening without air-fluid levels. Mastoid air cells are well aerated. The included ocular globes and orbital contents are non-suspicious.  Status post bilateral ocular lens implants. OTHER: None. IMPRESSION: 1. Interval RIGHT craniotomy for debulking occipital lobe tumor, improved appearance with decreased mass effect and re-expanded RIGHT occipital horn. 2. 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 3. Moderate white matter changes consistent chronic small vessel ischemic disease and known metastasis. Electronically Signed   By: Elon Alas M.D.   On: 10/08/2016 16:47   Ct Angio Chest Pe W Or Wo Contrast  Result Date: 10/23/2016 CLINICAL DATA:  Short of breath, recent surgery EXAM: CT ANGIOGRAPHY CHEST WITH CONTRAST TECHNIQUE: Multidetector CT imaging of the chest was performed using the standard protocol during bolus administration of intravenous contrast. Multiplanar CT  image reconstructions and MIPs were obtained to evaluate the vascular anatomy. CONTRAST:  75 mL Isovue 370 intravenous COMPARISON:  10/15/2016, CT chest 09/19/2016 FINDINGS: Cardiovascular: Satisfactory opacification of the pulmonary arteries to the segmental level. No evidence of pulmonary embolism. Ectatic ascending aorta, measuring up to 3.7 cm. No dissection is seen. Atherosclerotic calcifications. Enlarged pulmonary trunk, measuring up to 3.9 cm. Coronary artery calcification. Borderline cardiomegaly. No significant pericardial effusion. Mediastinum/Nodes: Re- demonstrated mediastinal adenopathy with increased size of previously noted abnormal lymph nodes. For example AP window soft tissue mass measures 3.9 x 2.3 cm, compared with 3.4 x 2.1 cm previously. Subcarinal mass measures 2.7 cm compared with 2.5 cm previously. Similar appearance of small right hilar nodes. Midline trachea. No thyroid mass. Esophagus unremarkable. Lungs/Pleura: Interim development of multifocal ground-glass densities throughout the lungs, most notable in the right upper lobe. Multiple bilateral pulmonary nodules, also worrisome for  metastatic disease with interval increase in size of several previously noted lesions, for example right middle lobe mass measures 2.2 x 1.6 cm, compared with 1.1 x 1.2 cm previously. A right central lung base mass measures 1.8 x 1.3 cm, compared with 1.5 x 1.2 cm previously. Right lower lobe/azygoesophageal mass measures 4.3 x 3 cm compared with 4.2 x 3.1 cm. Interval increase in multiple small pulmonary nodules in the right lower lobe and middle lobes. Increased contiguity of multiple left lower lobe pulmonary nodules. No pleural effusion or pneumothorax. Upper Abdomen: No acute abnormality. Musculoskeletal: Stable small sclerotic foci in T1 and T3. Review of the MIP images confirms the above findings. IMPRESSION: 1. Negative for acute pulmonary embolus or aortic dissection 2. Slight worsening of mediastinal metastatic adenopathy concerning for disease progression 3. Innumerable pulmonary nodules consistent with metastatic disease, increased number of nodules and size of pre-existing nodules also concerning for progression of disease. Interim development of multifocal ground-glass density within the bilateral lungs most notable in the right upper lobe, could reflect superimposed multifocal pneumonia. Aortic Atherosclerosis (ICD10-I70.0). Electronically Signed   By: Donavan Foil M.D.   On: 10/23/2016 20:27   Mr Brain Wo Contrast (neuro Protocol)  Result Date: 10/03/2016 CLINICAL DATA:  Altered level consciousness. History of metastatic breast cancer. Status post recent bulking of RIGHT occipital lobe metastasis. EXAM: MRI HEAD WITHOUT CONTRAST TECHNIQUE: Multiplanar, multiecho pulse sequences of the brain and surrounding structures were obtained without intravenous contrast. COMPARISON:  CT HEAD October 22, 2016 at 1612 hours and MRI of the head October 04, 2016 and August 23rd 2018 and FINDINGS: BRAIN: Multiple new subcentimeter foci of reduced diffusion RIGHT cerebellum with low ADC values. Stable  subcentimeter reduced diffusion and splenium of corpus callosum and RIGHT medial parietal lobe without ADC abnormality, possible mild nonenhancing tumor. Increasing fourth ventricle periventricular FLAIR T2 hyperintensities. Re- demonstration of LEFT cerebellar metastasis. Status post debulking of RIGHT occipital lobe tumor, with surrounding presumable gliosis and, potential residual tumor without mass effect. Susceptibility artifact within and along the margin of the resection cavity. No midline shift. Re-expanded RIGHT occipital horn and RIGHT atrium. No hydrocephalus. Similar patchy to confluent supratentorial white matter FLAIR T2 hyperintensities. LEFT parietal cortical T2 hyperintensity corresponding to known metastasis. No abnormal extra-axial fluid collections. Focal dural thickening subjacent to craniotomy. VASCULAR: Normal major intracranial vascular flow voids present at skull base. SKULL AND UPPER CERVICAL SPINE: Scattered mildly expansile T2 bright foci within RIGHT calvarial diploic space concerning for metastatic disease. Status post RIGHT parieto-occipital craniotomy. Partially empty sella. The Craniocervical junction maintained. SINUSES/ORBITS: Trace mastoid effusions. Mild paranasal sinus  mucosal thickening without air-fluid levels. The included ocular globes and orbital contents are non-suspicious. Status post bilateral ocular lens implants. OTHER: Larger RIGHT parietoccipital T2 bright fluid collection with susceptibility artifact overlying craniotomy, predominately of loosening signal on FLAIR sequence. IMPRESSION: 1. Acute subcentimeter RIGHT cerebellar infarcts, less likely new metastasis. 2. Interval debulking of RIGHT occipital tumor with improved appearance, residual edema and potential tumor ; limited assessment without contrast. No residual mass effect or midline shift. 3. 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 4. Additional intracranial metastasis  better characterized on prior MRI. 5. Moderate chronic small vessel ischemic disease. Electronically Signed   By: Elon Alas M.D.   On: 10/06/2016 17:27   Mr Jeri Cos JI Contrast  Result Date: 10/04/2016 CLINICAL DATA:  76 year old female postoperative day 1 stereotactic subtotal resection of a large right occipital brain tumor. Preliminary pathology suggesting breast cancer metastasis. Intraoperative evidence of tumor extension to the trigone of the lateral ventricle. EXAM: MRI HEAD WITHOUT AND WITH CONTRAST TECHNIQUE: Multiplanar, multiecho pulse sequences of the brain and surrounding structures were obtained without and with intravenous contrast. CONTRAST:  2mL MULTIHANCE GADOBENATE DIMEGLUMINE 529 MG/ML IV SOLN COMPARISON:  Preoperative brain MRI 09/28/2016 and earlier. FINDINGS: Brain: Right posterior paramidline craniotomy site with underlying small extra-axial air in fluid collection up to 11 mm in thickness. Subjacent resection cavity with a combination of fluid and gas. The resection cavity extends to the atrium of the right lateral ventricle just posterior to the choroid plexus. There is pneumoventricle, with gas in both frontal horns, but no ventriculomegaly. Small volume pneumocephalus along the right frontal convexity. A small peninsula of residual brain parenchyma along the superior aspect of the resection cavity demonstrates restricted diffusion (series 3, image 28). There otherwise several small foci of restricted along the right splenium of the corpus callosum diffusion. Postcontrast enhancement at the resection cavity is nodular and curvilinear along the anterior margin and junction with the atrium of the right lateral ventricle, best demonstrated on series 11, image 27, series 12, image 8 and series 13, image 8. Mild surrounding T2 and FLAIR hyperintensity with largely resolved mass effect on the adjacent ventricle. Stable 10 mm enhancing left cerebellar metastasis on series 11, image 9.  Continued confluent abnormal enhancement in both internal auditory canals, more so the left (series 11, images 12 and 13). Stable to slightly decreased punctate and curvilinear foci of enhancement along the left parietal lobe which are favored to be post ischemic rather than metastatic. No other enhancement suspicious for metastatic disease. Scattered nonspecific cerebral white matter T2 and FLAIR hyperintensity appears stable. No restricted diffusion to suggest acute infarction. No midline shift. Basilar cisterns remain patent. Cervicomedullary junction and pituitary are within normal limits. Vascular: Major intracranial vascular flow voids are stable aside from some extrinsic mass effect on the superior sagittal sinus near the craniectomy site. Skull and upper cervical spine: Negative visualized cervical spine. Stable heterogeneous calvarium bone marrow signal. Bone marrow signal at the skullbase and in the visible cervical spine remains normal. Sinuses/Orbits: Stable and negative orbits soft tissues. Mild paranasal sinus mucosal thickening is stable. Stable trace left mastoid effusion. Other: Postoperative changes to the superior and posterior scalp soft tissues including small scalp hematoma. IMPRESSION: 1. Significant debulking of the large right occipital lobe tumor. Small volume of residual suspicious enhancement along the junction of the anterior resection cavity and atrium of the right lateral ventricle. Several small areas of postoperative restricted diffusion as seen on series 3. 2. Stable  appearance of presumed metastatic disease in the left cerebellum and both internal auditory canals. No new leptomeningeal disease or brain metastasis identified. 3. Multiple punctate and small linear foci of cortical enhancement in the left parietal and posterior frontal lobes are still favored to be post ischemic in nature, but will require MRI surveillance. Electronically Signed   By: Genevie Ann M.D.   On: 10/04/2016  12:47   Mr Jeri Cos VZ Contrast  Result Date: 09/28/2016 CLINICAL DATA:  76 year old female with metastatic disease to the brain and in the chest. Subsequent encounter. EXAM: MRI HEAD WITHOUT AND WITH CONTRAST TECHNIQUE: Multiplanar, multiecho pulse sequences of the brain and surrounding structures were obtained without and with intravenous contrast. CONTRAST:  34mL MULTIHANCE GADOBENATE DIMEGLUMINE 529 MG/ML IV SOLN COMPARISON:  Brain MRI 09/19/2016. FINDINGS: Brain: Large heterogeneously enhancing right occipital lobe mass with extension to the atrium of the right lateral ventricle today encompasses 70 x 48 by 69 mm (AP by transverse by CC) versus 72 x 50 x 70 mm previously at a comparable level. Note that there is stable conspicuous increased enhancement at the ependyma of the posterior right lateral ventricle (series 11, image 96). Regional mass effect including on the posterior right lateral ventricle appears stable. 10 mm central metastasis in the left cerebellar hemisphere is stable in size. No regional mass effect. There are multiple small cortically based areas of enhancement in the left occipital pole which appear to correspond to the small diffusion restricted areas thought to be ischemia on 09/19/2016 (e.g. Series 11, image 53). Of note, Of note, there are also multiple similar small cortically based areas of enhancement in the left parietal lobe, and these are in proximity to the small round cortically based enhancement which is stable on series 11, image 107 and which was thought to be metastatic on 09/19/2016. Evidence of new abnormal internal auditory canal enhancement left greater than right (series 11, images 43 and 44). This is also visible on the coronal and sagittal post-contrast images. No no other intracranial metastatic disease identified. No leptomeningeal enhancement identified elsewhere. There is a new small focus of restricted diffusion in the left superolateral frontal lobe on series  5, image 44) without enhancement). There is also an increased focus of restricted diffusion in the central splenium of the corpus callosum on series 5, image 32 (no enhancement). No other ischemic related restricted diffusion. Stable intracranial mass effect and scattered nonenhancing nonspecific bilateral cerebral white matter T2 and FLAIR hyperintense foci. No acute intracranial hemorrhage identified. Negative pituitary and cervicomedullary junction. Vascular: Major intracranial vascular flow voids are stable. Skull and upper cervical spine: Indeterminate 15 mm enhancing right posterior calvarium lesion appears stable on series 11, image 100. There are additional smaller right calvarial enhancing lesions (such as on series 11, image 117. Negative visualized cervical spine and spinal cord. Sinuses/Orbits: Stable and negative. Other: Mastoids remain clear.  Negative scalp soft tissues. IMPRESSION: 1. Large 7 cm right occipital lobe enhancing mass with associated mass effect is stable since 09/19/2016. Note associated posterior right lateral ventricle ependymal enhancement suspicious for extension along the ventricle. 2. New abnormal bilateral Internal Auditory Canal enhancement highly suspicious for leptomeningeal metastatic disease, although no other leptomeningeal enhancement is identified. 3. 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 4. Multiple small cortically based areas of enhancement along the posterior left hemisphere appear to be post ischemic. This raises the possibility that the punctate enhancement (presumed third brain metastasis on 09/20/2015) in the left parietal lobe is instead post-ischemic.  5. New small acute lacunar infarct in the posterior left frontal lobe (series 5, image 44). Electronically Signed   By: Genevie Ann M.D.   On: 09/28/2016 18:14   Dg Chest Port 1 View  Result Date: 10/02/2016 CLINICAL DATA:  Bilateral lower extremity swelling. EXAM: PORTABLE CHEST 1 VIEW COMPARISON:   09/18/2016. FINDINGS: Mediastinum and hilar structures normal. Cardiomegaly with mild bilateral interstitial prominence and small bilateral pleural effusions findings consistent with congestive heart failure. Surgical clips right chest. IMPRESSION: Congestive heart failure with bilateral pulmonary interstitial edema and small bilateral pleural effusions similar findings noted on prior study of 09/18/2016. Electronically Signed   By: Marcello Moores  Register   On: 10/12/2016 15:22    Lab Data:  CBC:  Recent Labs Lab 10/06/2016 1548  WBC 14.0*  NEUTROABS 12.7*  HGB 15.5*  HCT 44.3  MCV 90.8  PLT 631   Basic Metabolic Panel:  Recent Labs Lab 09/29/2016 1548 10/24/16 0527 10/25/16 0813  NA 132* 129* 129*  K 4.0 3.8 3.6  CL 96* 97* 95*  CO2 26 23 23   GLUCOSE 127* 132* 129*  BUN 19 14 19   CREATININE 0.67 0.55 0.71  CALCIUM 8.6* 8.8* 9.0   GFR: Estimated Creatinine Clearance: 62.2 mL/min (by C-G formula based on SCr of 0.71 mg/dL). Liver Function Tests:  Recent Labs Lab 10/02/2016 1548  AST 25  ALT 67*  ALKPHOS 94  BILITOT 0.6  PROT 5.8*  ALBUMIN 3.2*   No results for input(s): LIPASE, AMYLASE in the last 168 hours. No results for input(s): AMMONIA in the last 168 hours. Coagulation Profile: No results for input(s): INR, PROTIME in the last 168 hours. Cardiac Enzymes: No results for input(s): CKTOTAL, CKMB, CKMBINDEX, TROPONINI in the last 168 hours. BNP (last 3 results) No results for input(s): PROBNP in the last 8760 hours. HbA1C:  Recent Labs  10/23/16 0520  HGBA1C 5.9*   CBG:  Recent Labs Lab 10/19/2016 1555  GLUCAP 128*   Lipid Profile:  Recent Labs  10/23/16 0520  CHOL 205*  HDL 72  LDLCALC 115*  TRIG 89  CHOLHDL 2.8   Thyroid Function Tests: No results for input(s): TSH, T4TOTAL, FREET4, T3FREE, THYROIDAB in the last 72 hours. Anemia Panel: No results for input(s): VITAMINB12, FOLATE, FERRITIN, TIBC, IRON, RETICCTPCT in the last 72 hours. Urine  analysis:    Component Value Date/Time   COLORURINE YELLOW 10/17/2016 1548   APPEARANCEUR HAZY (A) 10/23/2016 1548   LABSPEC 1.010 09/29/2016 1548   PHURINE 7.0 10/24/2016 [place] 10/20/2016 [place] 10/10/2016 [place] 09/28/2016 1548   KETONESUR NEGATIVE 10/11/2016 1548   PROTEINUR NEGATIVE 09/30/2016 1548   NITRITE NEGATIVE 10/06/2016 1548   LEUKOCYTESUR NEGATIVE 10/21/2016 1548     Deanna Washington M.D. [facility] 10/25/2016, 10:29 AM  Pager: [phone] Between 7am to 7pm - call Pager - [phone]  After 7pm go to [email or web address] - password [email or web address]  Call night coverage person covering after 7pm

## 2016-10-25 NOTE — Progress Notes (Signed)
SLP Cancellation Note  Patient Details Name: Deanna Washington MRN: [id number] DOB: Nov 06, 1940   Cancelled treatment:       Reason Eval/Treat Not Completed: Medical issues which prohibited therapy, pt with medical changes and is now awaiting transfer to stepdown unit    Emilio Math 10/25/2016, 10:55 AM

## 2016-10-25 NOTE — Care Management Important Message (Signed)
Important Message  Patient Details  Name: Deanna Washington MRN: [id number] Date of Birth: 1940/04/08   Medicare Important Message Given:  Yes    [name] Mont[address] Circle 10/25/2016, 2:55 PM

## 2016-10-25 NOTE — Progress Notes (Signed)
Noted at start of shift and bedside reporting that area surrounding incision of craniotomy with increased swelling, soft to the touch and painful to patient.  Patient also complaining of increased headache both posterior and anterior. Neurologically intact with symmetric body movement.  Patient states very sleepy and does not open eyes for very long due to photophobia.  Notified internal medicine and rapid response nurse. Patient's daughter has already notified Dr. Saintclair Halsted of changes.  VSS.  CT ordered and completed. Awaiting transfer to a stepdown bed. Wendee Copp

## 2016-10-25 NOTE — Progress Notes (Signed)
10/25/2016 Patient is alert, oriented and weak. Transfer from 35M to 21m. Patient have bruises on bilateral arms, neck and incision where the craniotomy area is swollen and the incision clean and dry, sacrum no breakage. Swelling in lower extremities is a plus 4. Dry area on bottom of foot. [facility] RN.

## 2016-10-25 NOTE — Progress Notes (Signed)
OT Cancellation Note  Patient Details Name: Arlenis Blaydes MRN: [id number] DOB: 1940/12/23   Cancelled Treatment:    Reason Eval/Treat Not Completed: Medical issues which prohibited therapy.  Pt. Is awaiting transfer to a stepdown unit secondary to medical changes.  Will check back first of week if/when pt. Stable for participation in skilled therapies.    Janice Coffin, COTA/L 10/25/2016, 10:09 AM

## 2016-10-25 NOTE — Progress Notes (Signed)
PT Cancellation Note  Patient Details Name: Deanna Washington MRN: [id number] DOB: 06-May-1940   Cancelled Treatment:    Reason Eval/Treat Not Completed: Medical issues which prohibited therapy. Pt awaiting transfer to step down unit. PT will continue to f/u with pt as available and appropriate.    Clearnce Sorrel Zian Mohamed 10/25/2016, 11:09 AM

## 2016-10-25 NOTE — Progress Notes (Signed)
STROKE TEAM PROGRESS NOTE   SUBJECTIVE (INTERVAL HISTORY) Daughter is at the bedside. As per report, pt is more drowsy sleepy and complains of HA and nausea. Repeat CT showed progressive cerebrospinal fluid leakage through the scalp incision. NSG was called for recommendations. Pt will transfer to step down as per primary team.    OBJECTIVE Temp:  [97.6 F (36.4 C)-98.5 F (36.9 C)] 97.8 F (36.6 C) (09/28 1812) Pulse Rate:  [68-97] 84 (09/28 1812) Cardiac Rhythm: Normal sinus rhythm (09/28 1811) Resp:  [16-24] 24 (09/28 1812) BP: (143-160)/(88-94) 150/93 (09/28 1714) SpO2:  [87 %-98 %] 96 % (09/28 1812) Weight:  [163 lb 11.2 oz (74.3 kg)] 163 lb 11.2 oz (74.3 kg) (09/28 0155)  CBC:   Recent Labs Lab 10/21/2016 1548  WBC 14.0*  NEUTROABS 12.7*  HGB 15.5*  HCT 44.3  MCV 90.8  PLT 623    Basic Metabolic Panel:   Recent Labs Lab 10/24/16 0527 10/25/16 0813  NA 129* 129*  K 3.8 3.6  CL 97* 95*  CO2 23 23  GLUCOSE 132* 129*  BUN 14 19  CREATININE 0.55 0.71  CALCIUM 8.8* 9.0    Lipid Panel:     Component Value Date/Time   CHOL 205 (H) 10/23/2016 0520   TRIG 89 10/23/2016 0520   HDL 72 10/23/2016 0520   CHOLHDL 2.8 10/23/2016 0520   VLDL 18 10/23/2016 0520   LDLCALC 115 (H) 10/23/2016 0520   HgbA1c:  Lab Results  Component Value Date   HGBA1C 5.9 (H) 10/23/2016   Urine Drug Screen: No results found for: LABOPIA, COCAINSCRNUR, LABBENZ, AMPHETMU, THCU, LABBARB  Alcohol Level No results found for: Leigh I have personally reviewed the radiological images below and agree with the radiology interpretations.  Ct Head Wo Contrast 10/18/2016 IMPRESSION: 1. Interval RIGHT craniotomy for debulking occipital lobe tumor, improved appearance with decreased mass effect and re-expanded RIGHT occipital horn. 2. 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 3. Moderate white matter changes consistent chronic  small vessel ischemic disease and known metastasis.  Mr Brain Wo Contrast (neuro Protocol) 10/16/2016 IMPRESSION: 1. Acute subcentimeter RIGHT cerebellar infarcts, less likely new metastasis. 2. Interval debulking of RIGHT occipital tumor with improved appearance, residual edema and potential tumor ; limited assessment without contrast. No residual mass effect or midline shift. 3. 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 4. Additional intracranial metastasis better characterized on prior MRI. 5. Moderate chronic small vessel ischemic disease.  Dg Chest Port 1 View 10/24/2016 IMPRESSION: Congestive heart failure with bilateral pulmonary interstitial edema and small bilateral pleural effusions similar findings noted on prior study of 09/18/2016.  Lower Extremity US (DVT) 10/15/2016 - No evidence of deep vein or superficial thrombosis involving the   right lower extremity and left lower extremity. - No evidence of Baker&'s cyst on the right or left.  Carotid US 09/20/2016 Findings suggest 1-39% internal carotid artery stenosis bilaterally. Vertebral arteries are patent with antegrade flow.  TTE 09/19/2016 Study Conclusions - Left ventricle: The cavity size was normal. There was mild concentric hypertrophy. Systolic function was normal. The estimated ejection fraction was in the range of 60% to 65%. Wall motion was normal; there were no regional wall motion abnormalities. Doppler parameters are consistent with abnormal left ventricular relaxation (grade 1 diastolic dysfunction).  There was no evidence of elevated ventricular filling pressure by Doppler parameters. - Aortic valve: There was mild regurgitation. - Aortic root: The aortic root was  normal in size. - Mitral valve: There was trivial regurgitation. - Left atrium: The atrium was normal in size. - Right atrium: The atrium was mildly dilated. - Tricuspid valve: There was moderate regurgitation. -  Pulmonic valve: There was no regurgitation. - Pulmonary arteries: Systolic pressure was moderately increased.  PA peak pressure: 45 mm Hg (S). - Inferior vena cava: The vessel was dilated. The respirophasic diameter changes were blunted (< 50%), consistent with elevated central venous pressure. - Pericardium, extracardiac: A trivial pericardial effusion was identified posterior to the heart. Features were not consistent with tamponade physiology. Impressions: - No cardiac source of emboli was indentified.  Ct Head Wo Contrast 10/25/2016 IMPRESSION: 1. Progressive subgaleal fluid collection around the right occipital bone flap, presumably CSF. 2. No acute intracranial finding. 3. Ischemic and metastatic findings in the brain by recent MRI that are not detectable or progressive by CT.   PHYSICAL EXAM  Temp:  [97.6 F (36.4 C)-98.5 F (36.9 C)] 97.8 F (36.6 C) (09/28 1812) Pulse Rate:  [68-97] 84 (09/28 1812) Resp:  [16-24] 24 (09/28 1812) BP: (143-160)/(88-94) 150/93 (09/28 1714) SpO2:  [87 %-98 %] 96 % (09/28 1812) Weight:  [163 lb 11.2 oz (74.3 kg)] 163 lb 11.2 oz (74.3 kg) (09/28 0155)  General - Well nourished, well developed, sitting in chair, mildly lethargic.  Ophthalmologic - Fundi not visualized due to mildly acute distress.  Cardiovascular - Regular rate and rhythm, not in A. fib.  Mental Status -  Mildly lethargic but easily arousable, orientation to time, place, and person were intact. Language including expression, naming, repetition, comprehension was assessed and found intact, but mild dysarthria.  Cranial Nerves II - XII - II - Visual field intact OU. III, IV, VI - Extraocular movements intact. Chronic disconjugate eyes without diplopia V - Facial sensation intact bilaterally. VII - right nasolabial fold flattening. VIII - Hearing & vestibular intact bilaterally. X - Palate elevates symmetrically, mild dysarthria. XI - Chin turning & shoulder shrug intact  bilaterally. XII - Tongue protrusion intact.  Motor Strength - The patient's strength was normal in both upper extremities and pronator drift was absent. Bilateral lower extremity swollen, 3/5 LLE, and 3-/5 RLE.  Bulk was normal and fasciculations were absent.   Motor Tone - Muscle tone was assessed at the neck and appendages and was normal.  Reflexes - The patient's reflexes were 1+ in all extremities and she had no pathological reflexes.  Sensory - Light touch, temperature/pinprick were assessed and were symmetrical.    Coordination - The patient had left arm dysmetria.  Tremor was absent.  Gait and Station - not tested.   ASSESSMENT/PLAN Ms. Celina Shiley is a 76 y.o. female with history of  HTN, paroxysmal atrial fibrillation,chronic lower extremity edema, asthma, stage IV metastatic breast cancer status post craniotomy resection of right occipital brain metastatic lesion on 10/03/2016 was admitted to by her neurosurgeon after presenting with BLE edema and confusion.   She did not receive IV t-PA due to recent neurosurgery.   Stroke, incidental finding: Acute subcentimeter RIGHT cerebellar infarcts, likely embolic, in the setting of metastatic cancer/hypercoagulable state and PAF with anticoagulation held for recent intracranial tumor debulking surgery.  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  MRI head: Acute subcentimeter RIGHT cerebellar infarcts  Carotid Doppler 09/20/16 unremarkable   2D Echo (09/19/2016): EF 60-65%. No source of embolus  LDL 115  HgbA1c 5.9  SCDs for VTE prophylaxis Diet Heart Room service appropriate? Yes; Fluid consistency: Thin  No antithrombotic prior to admission, put on eliquis. Now eliquis was on hold due to acute progressive CSF leakage through the scalp incision.  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, brain and esophagus  Oncology follow-up   Recent intracranial tumor debulking surgery   Follow-up with neurosurgery   Whole brain radiation as outpatient as scheduled   PAF  Not on AC due to recent intracranial debulking surgery  Put on eliquis   Now on hold in case NSG will need further procedure due to subgaleal fluid collection  Hypertension  Stable BP goal normotensive  Hyperlipidemia  Home meds: atorvastatin 20 mg PO daily, esumed in hospital  LDL 115, goal < 70  Increase statin dose to atorvastatin 40 mg PO daily  Continue statin at discharge  Other Stroke Risk Factors  Advanced age  ETOH use, advised to drink no more than 1 drink(s) a day  Other Active Problems  Hyponatremia  Leukocytosis  Hospital day # 3  This patient is medically critical due to acute change of neuro status. Patient is at high risk for recurrent strokes and TIAs and neurological worsening. This patient's care requires constant monitoring of vital signs, hemodynamics, respiratory and cardiac monitoring, review of multiple databases, neurological assessment, discussion with family, other specialists and medical decision making of high complexity. I spent 35 min in taking care of this pt.  Rosalin Hawking, MD PhD Stroke Neurology 10/25/2016 6:23 PM   To contact Stroke Continuity provider, please refer to [email or web address] After hours, contact General Neurology

## 2016-10-26 DIAGNOSIS — G9782 Other postprocedural complications and disorders of nervous system: Secondary | ICD-10-CM

## 2016-10-26 DIAGNOSIS — Z515 Encounter for palliative care: Secondary | ICD-10-CM

## 2016-10-26 DIAGNOSIS — Z9889 Other specified postprocedural states: Secondary | ICD-10-CM

## 2016-10-26 DIAGNOSIS — I4891 Unspecified atrial fibrillation: Secondary | ICD-10-CM

## 2016-10-26 DIAGNOSIS — D496 Neoplasm of unspecified behavior of brain: Secondary | ICD-10-CM

## 2016-10-26 LAB — BASIC METABOLIC PANEL
Anion gap: 13 (ref 5–15)
BUN: 34 mg/dL — ABNORMAL HIGH (ref 6–20)
CALCIUM: 9.2 mg/dL (ref 8.9–10.3)
CO2: 25 mmol/L (ref 22–32)
CREATININE: 0.92 mg/dL (ref 0.44–1.00)
Chloride: 94 mmol/L — ABNORMAL LOW (ref 101–111)
GFR calc non Af Amer: 60 mL/min — ABNORMAL LOW (ref >60)
Glucose, Bld: 170 mg/dL — ABNORMAL HIGH (ref 65–99)
Potassium: 3.5 mmol/L (ref 3.5–5.1)
SODIUM: 132 mmol/L — AB (ref 135–145)

## 2016-10-26 MED ORDER — ATROPINE SULFATE 1 % OP SOLN
1.0000 [drp] | OPHTHALMIC | Status: DC | PRN
  Administered 2016-10-27: 1 [drp] via SUBLINGUAL
  Filled 2016-10-26: qty 2

## 2016-10-26 MED ORDER — LORAZEPAM 2 MG/ML IJ SOLN
0.5000 mg | INTRAMUSCULAR | Status: DC | PRN
  Administered 2016-10-26: 0.5 mg via INTRAVENOUS
  Administered 2016-10-27 (×2): 1 mg via INTRAVENOUS
  Filled 2016-10-26 (×3): qty 1

## 2016-10-26 MED ORDER — ORAL CARE MOUTH RINSE
15.0000 mL | Freq: Two times a day (BID) | OROMUCOSAL | Status: DC
  Administered 2016-10-26 – 2016-10-27 (×2): 15 mL via OROMUCOSAL

## 2016-10-26 MED ORDER — DEXAMETHASONE SODIUM PHOSPHATE 10 MG/ML IJ SOLN
4.0000 mg | Freq: Four times a day (QID) | INTRAMUSCULAR | Status: DC
  Administered 2016-10-26 – 2016-10-27 (×4): 4 mg via INTRAVENOUS
  Filled 2016-10-26 (×4): qty 1

## 2016-10-26 MED ORDER — MORPHINE SULFATE (PF) 4 MG/ML IV SOLN
2.0000 mg | INTRAVENOUS | Status: DC | PRN
  Administered 2016-10-26 – 2016-10-27 (×4): 2 mg via INTRAVENOUS
  Administered 2016-10-27: 4 mg via INTRAVENOUS
  Filled 2016-10-26 (×5): qty 1

## 2016-10-26 MED ORDER — SODIUM CHLORIDE 0.9 % IV SOLN
500.0000 mg | Freq: Two times a day (BID) | INTRAVENOUS | Status: DC
  Administered 2016-10-26 – 2016-10-27 (×2): 500 mg via INTRAVENOUS
  Filled 2016-10-26 (×4): qty 5

## 2016-10-26 MED ORDER — PANTOPRAZOLE SODIUM 40 MG IV SOLR
40.0000 mg | INTRAVENOUS | Status: DC
  Administered 2016-10-27: 40 mg via INTRAVENOUS
  Filled 2016-10-26: qty 40

## 2016-10-26 MED ORDER — SODIUM CHLORIDE 0.9 % IV SOLN: INTRAVENOUS | Status: DC

## 2016-10-26 MED ORDER — FUROSEMIDE 10 MG/ML IJ SOLN
40.0000 mg | Freq: Every day | INTRAMUSCULAR | Status: DC
  Administered 2016-10-27: 40 mg via INTRAVENOUS
  Filled 2016-10-26: qty 4

## 2016-10-26 NOTE — Progress Notes (Signed)
Call returned to patients daughter with no answer. Message left with unit contact information.

## 2016-10-26 NOTE — Progress Notes (Addendum)
Physical Therapy Treatment Patient Details Name: Deanna Washington MRN: [id number] DOB: 08-02-1940 Today's Date: 10/26/2016    History of Present Illness Pt is a 76 y.o.femaleCaucasian right-handed with past medical history of HTN, paroxysmal atrial fibrillation,chronic lower extremity edema, asthma, stage IV metastatic breast cancer status post craniotomy resection of right occipital brain metastatic lesion on 10/03/2016 was admitted to the hospital for worsening bilateral leg edema. MRI results show acute subcentimeter R cerebrallar infarcts.    PT Comments    Pt admitted with above diagnosis. Pt currently with functional limitations due to the deficits listed below (see PT Problem List). Pt with poor tolerance of activity today.  Pt did want to get OOB but did not assist much at all with bed mobility or transfer. VS 96% on 2LO2, BP 147/94 and then 167/102 once in chair.  Pt leans left in sitting hindering good posture.  Changed d/c plan to SNF and decr frequency to 3x week given pt's slow progress. Also revised goals as pt with worsening symptoms on Friday and is not max assist.  None of previous goals met. Keep time frame as set before. Will follow acutely.   Pt will benefit from skilled PT to increase their independence and safety with mobility to allow discharge to the venue listed below.     Follow Up Recommendations  SNF;Supervision/Assistance - 24 hour     Equipment Recommendations  None recommended by PT    Recommendations for Other Services       Precautions / Restrictions Precautions Precautions: Fall Restrictions Weight Bearing Restrictions: No    Mobility  Bed Mobility Overal bed mobility: Needs Assistance Bed Mobility: Supine to Sit     Supine to sit: Max assist     General bed mobility comments: max assist for elevation of trunk.   Transfers Overall transfer level: Needs assistance Equipment used:  (bed pad) Transfers: Lateral/Scoot Transfers          Lateral/Scoot Transfers: Max assist;From elevated surface General transfer comment: used pad to drop arm recliner to scoot.  Pt did weight bear on feet but did not assist much with transfer other than holding postural stablity.   Ambulation/Gait                 Stairs            Wheelchair Mobility    Modified Rankin (Stroke Patients Only) Modified Rankin (Stroke Patients Only) Pre-Morbid Rankin Score: No symptoms Modified Rankin: Moderately severe disability     Balance Overall balance assessment: Needs assistance Sitting-balance support: Feet supported;Bilateral upper extremity supported Sitting balance-Leahy Scale: Poor Sitting balance - Comments: Pt had to have constant assist to maintain sitting as she was losing balance all directions. Leans left > than Right        Standing balance comment: unable to stand                            Cognition Arousal/Alertness: Lethargic Behavior During Therapy: Flat affect Overall Cognitive Status: Impaired/Different from baseline Area of Impairment: Attention;Memory;Awareness                   Current Attention Level: Focused Memory: Decreased short-term memory     Awareness: Intellectual   General Comments: decr following of commands with incr lethargy      Exercises      General Comments        Pertinent Vitals/Pain Pain Assessment: Faces Faces Pain Scale:  Hurts little more Pain Location: LEs Pain Descriptors / Indicators: Aching;Guarding;Grimacing Pain Intervention(s): Limited activity within patient's tolerance;Monitored during session;Premedicated before session;Repositioned    Home Living Family/patient expects to be discharged to:: Private residence Living Arrangements: Children;Spouse/significant other Available Help at Discharge: Family Type of Home: House Home Access: Ramped entrance   Home Layout: One level Home Equipment: Environmental consultant - 2 wheels;Tub bench;Bedside  commode;Grab bars - tub/shower;Grab bars - toilet Additional Comments: Daughter lives close by and assists with IADLs    Prior Function Level of Independence: Independent      Comments: pt reported that she was the primary caregiver for her husband who is home with hospice. Pt daughter performs Control and instrumentation engineer and driving.    PT Goals (current goals can now be found in the care plan section) Progress towards PT goals: Progressing toward goals    Frequency    Min 3X/week      PT Plan Discharge plan needs to be updated    Co-evaluation              AM-PAC PT "6 Clicks" Daily Activity  Outcome Measure  Difficulty turning over in bed (including adjusting bedclothes, sheets and blankets)?: Unable Difficulty moving from lying on back to sitting on the side of the bed? : Unable Difficulty sitting down on and standing up from a chair with arms (e.g., wheelchair, bedside commode, etc,.)?: Unable Help needed moving to and from a bed to chair (including a wheelchair)?: Total Help needed walking in hospital room?: Total Help needed climbing 3-5 steps with a railing? : Total 6 Click Score: 6    End of Session Equipment Utilized During Treatment: Gait belt;Oxygen Activity Tolerance: Patient limited by fatigue Patient left: with call bell/phone within reach;in chair;with chair alarm set Nurse Communication: Mobility status;Need for lift equipment PT Visit Diagnosis: Difficulty in walking, not elsewhere classified (R26.2)     Time: 0943-1000 PT Time Calculation (min) (ACUTE ONLY): 17 min  Charges:  $Therapeutic Activity: 8-22 mins                    G Codes:       Shanae Luo,PT Acute Rehabilitation [phone] (pager)    Denice Paradise 10/26/2016, 2:10 PM

## 2016-10-26 NOTE — Consult Note (Signed)
Consultation Note Date: 10/26/2016   Patient Name: Deanna Washington  DOB: October 29, 1940  MRN: [id number]  Age / Sex: 76 y.o., female  PCP: Tisovec, Fransico Him, MD Referring Physician: Mendel Corning, MD  Reason for Consultation: Establishing goals of care, Hospice Evaluation, Non pain symptom management, Pain control, Psychosocial/spiritual support and Terminal Care  HPI/Patient Profile: 76 y.o. female  with past medical history of Hypertension, atrial fibrillation, breast cancer (diagnosed in 2010) admitted on 09/28/2016 with increased lower extremity edema as well as weakness. Patient was found to have return of disease and underwent brain resection right occipital lobe on 10/03/2016. Plan was to continue with treatment and to go for brain radiation to begin on 10/28/2016. Upon admission, patient was found to have had a CVA (questionable hypercoagulable state secondary to cancer versus postsurgical complication). Repeat CT scan shows progressive CF leakage; and innumerable bilateral pulmonary nodules  Patient is becoming more lethargic, complaining of nausea, and has developed mottling. Consult ordered for goals of care, hospice referral.   Clinical Assessment and Goals of Care: This is a very unfortunate situation as both patient and her husband are terminally ill with cancer. Patient, was her husband's primary caregiver with the support of her daughter and son that lives locally until her breast cancer returned with metastatic disease to the brain and lungs. Patient has 2 other children that are arriving from [place] on Nov 26, 2016.  As noted above, patient is declining as evidenced by decreased mental status, lethargy, complaints of nausea as well as pain. She is mottled, cool. She appears to be transitioning towards end-of-life  Primary healthcare power of attorney is her daughter Kathrine Cords [phone]. 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 of 26-Nov-2016 to arrange follow-up goals of care appointment Prepared Debbie that this could be end-of-life, potential prognosis of hours to days Discussed hospice specifically residential hospice Code Status/Advance Care Planning:  DNR    Symptom Management:   Pain: Patient is acknowledging pain but has recently received morphine; will increase morphine 2-4 mg every 2 hours as needed for pain  Dyspnea: Continue with supportive O2 therapy as well as opioids as indicated above  Anxiety: Patient is having difficulty swallowing at this point; will order Ativan IV route 0.5-1 mg every 4 hours as needed  Seizures: Continue Keppra and Decadron  Palliative Prophylaxis:   Aspiration, Bowel Regimen, Delirium Protocol, Eye Care, Frequent Pain Assessment, Oral Care and Turn Reposition  Psycho-social/Spiritual:   Desire for further Chaplaincy support:no  Additional Recommendations: Grief/Bereavement  Support  Prognosis:   < 2 weeks barring an acute event, in the setting of metastatic breast cancer with disease spread to the brain, postop resection, bilateral pulmonary nodules, increasing lethargy symptomatic with complaints of pain and nausea  Discharge Planning: Residential hospice versus hospital death. If she continues to decline this quickly she may be too unstable for transport      Primary Diagnoses: Present on Admission: . Acute  embolic stroke ([place]) . Asthma . Atrial fibrillation, new onset ([place]) . Brain tumor ([place]) . Metastasis to brain [facility])   I have reviewed the medical record, interviewed the patient and family, and examined the patient. The following aspects are pertinent.  Past Medical History:  Diagnosis Date  . Asthma   . 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 Breast cancer [facility])    believes it was in 2010  . HTN (hypertension)   . 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 Marital status: Married    Spouse name: N/A  . Number of children: N/A  . Years of education: N/A   Social History Main Topics  . Smoking status: Former Research scientist (life sciences)  . Smokeless tobacco: Never Used     Comment: quit 25 years ago  . Alcohol use 1.2 oz/week    2 Glasses of wine per week     Comment: 2 glasses a day  . Drug use: No  . Sexual activity: Not Currently   Other Topics Concern  . None   Social History Narrative  . None   Family History  Problem Relation Age of Onset  . Lung cancer Sister   . Esophageal cancer Brother   . Leukemia Maternal Aunt   . Lung cancer Brother   . Leukemia Brother   . Lung cancer Sister   . Breast cancer Neg Hx   . Colon cancer Neg Hx   . Pancreatic cancer Neg Hx    Scheduled Meds: . acetaZOLAMIDE  250 mg Oral BID  . amiodarone  100 mg Oral BID  . atorvastatin  40 mg Oral Daily  . dexamethasone  4 mg Intravenous Q6H  . [START ON 11-04-16] furosemide  40 mg Intravenous Daily  . mouth rinse  15 mL Mouth Rinse BID  . mometasone-formoterol  2 puff Inhalation BID  . pantoprazole (PROTONIX) IV  40 mg Intravenous Q24H   Continuous Infusions: . levETIRAcetam    . levofloxacin (LEVAQUIN) IV Stopped (10/26/16 1047)   PRN Meds:.acetaminophen **OR** acetaminophen (TYLENOL) oral liquid 160 mg/5 mL **OR** acetaminophen, albuterol, hydrALAZINE, HYDROcodone-acetaminophen, LORazepam, morphine injection, ondansetron (ZOFRAN)  IV Medications Prior to Admission:  Prior to Admission medications   Medication Sig Start Date End Date Taking? Authorizing Provider  ADVAIR DISKUS 500-50 MCG/DOSE AEPB Inhale 1 puff into the lungs 2 (two) times daily. 10/16/16  Yes Hayden Pedro, PA-C  amiodarone (PACERONE) 200 MG tablet Take 0.5 tablets (100 mg total) by mouth 2 (two) times daily. 10/21/16  Yes Skeet Latch, MD  dexamethasone (DECADRON) 4 MG tablet Take 2 tablets (8 mg total) by mouth 2 (two) times daily with a meal. Patient taking differently: Take 4-8 mg by mouth 3 (three) times daily. Take 8mg  by mouth in the morning, 4mg  in the afternoon, and 8mg  at bedtime 09/24/16  Yes Short, Noah Delaine, MD  diphenhydrAMINE (BENADRYL) 25 mg capsule Take 25 mg by mouth at bedtime as needed for sleep.    Yes [provider]  furosemide (LASIX) 20 MG tablet Take 1 tablet (20 mg total) by mouth daily. 10/18/16 01/16/17 Yes Skeet Latch, MD  HYDROcodone-acetaminophen (NORCO/VICODIN) 5-325 MG tablet Take 1 tablet by mouth every 4 (four) hours as needed for moderate pain. 10/07/16  Yes Meyran, Ocie Cornfield, NP  levETIRAcetam (KEPPRA) 500 MG tablet Take 1 tablet (500 mg total) by mouth 2 (two) times daily. 10/07/16  Yes Meyran, Ocie Cornfield, NP  LORazepam (ATIVAN) 0.5 MG tablet 1 tablet po 30 minutes prior to radiation or MRI 10/16/16  Yes Hayden Pedro, PA-C  nystatin (MYCOSTATIN) 100000 UNIT/ML suspension Take 5 mLs (500,000 Units total) by mouth 4 (four) times daily. 10/16/16  Yes Hayden Pedro, PA-C  ondansetron (ZOFRAN) 4 MG tablet Take 1 tablet (4 mg total) by mouth every 4 (four) hours as needed for nausea or vomiting. 10/07/16  Yes Meyran, Ocie Cornfield, NP  pantoprazole (PROTONIX) 40 MG tablet Take 1 tablet (40 mg total) by mouth daily. 09/24/16  Yes Short, Noah Delaine, MD  PROAIR HFA [phone] Base) MCG/ACT inhaler Place 1-2 puffs into alternate nostrils every 4 (four) hours as needed for wheezing.  08/01/16  Yes [provider]  atorvastatin (LIPITOR) 20 MG tablet Take 1 tablet (20 mg total) by mouth daily. Patient not taking: Reported on 10/16/2016 09/23/16 09/23/17  Janece Canterbury, MD  lapatinib (TYKERB) 250 MG tablet Take 3 tablets (750 mg total) by mouth daily. Take on an empty stomach, 1 hour before or 2 hours after a meal 10/18/16   Magrinat, Virgie Dad, MD   No Known Allergies Review of Systems  Unable to perform ROS: Acuity of condition    Physical Exam  Constitutional: She appears well-developed and well-nourished.  Acutely ill appearing older female; she is lethargic.  HENT:  Incision to right occipital area; associated edema around surgical site  Cardiovascular: Normal rate.   3+ lower extremity edema  Pulmonary/Chest:  Increased work of breathing at rest  Genitourinary:  Genitourinary Comments: Foley  Neurological:  Lethargic Able to answer simple questions, yes/no  Skin:  Cool, mottled to knees and feet  Psychiatric:  Unable to test  Nursing note and vitals reviewed.   Vital Signs: BP (!) 145/99   Pulse 87   Temp 97.6 F (36.4 C) (Axillary)   Resp 17   Ht 5\' 8"  (1.727 m)   Wt 68.9 kg (151 lb 14.4 oz)   SpO2 97%   BMI 23.10 kg/m  Pain Assessment: No/denies pain POSS *See Group Information*: S-Acceptable,Sleep, easy to arouse Pain Score: 7    SpO2: SpO2: 97 % O2 Device:SpO2: 97 % O2 Flow Rate: .O2 Flow Rate (L/min): 2 L/min  IO: Intake/output summary:  Intake/Output Summary (Last 24 hours) at 10/26/16 1345 Last data filed at 10/26/16 0420  Gross per 24 hour  Intake              100 ml  Output             2025 ml  Net            -1925 ml    LBM: Last BM Date: 10/23/16 Baseline Weight: Weight: 72.8 kg (160 lb 8 oz) Most recent weight: Weight: 68.9 kg (151 lb 14.4 oz)     Palliative Assessment/Data:   Flowsheet Rows     Most Recent Value  Intake Tab  Referral Department  Hospitalist  Unit at Time of Referral  Intermediate Care Unit   Palliative Care Primary Diagnosis  Cancer  Date Notified  10/25/16  Palliative Care Type  New Palliative care  Reason for referral  Pain, Non-pain Symptom, Clarify Goals of Care, Counsel Regarding Hospice, Psychosocial or Spiritual support  Date of Admission  10/02/2016  Date first seen by Palliative Care  10/26/16  # of days Palliative referral response time  1 Day(s)  # of days IP prior to Palliative referral  3  Clinical Assessment  Palliative Performance Scale Score  20%  Pain Max last 24 hours  Not able to report  Pain Min Last 24 hours  Not able to report  Dyspnea Max Last 24 Hours  Not able to report  Dyspnea Min Last 24 hours  Not able to report  Nausea Max Last 24 Hours  Not able to report  Nausea Min Last 24 Hours  Not able to report  Anxiety Max Last 24 Hours  Not able to report  Anxiety Min Last 24 Hours  Not able to report  Other Max Last 24 Hours  Not able to report  Psychosocial & Spiritual Assessment  Palliative Care Outcomes  Patient/Family meeting held?  Yes  Who was at the meeting?  daughter Deanna Washington  Palliative Care Outcomes  Improved pain interventions, Improved non-pain symptom therapy, Clarified goals of care, Counseled regarding hospice, Provided psychosocial or spiritual support  Patient/Family wishes: Interventions discontinued/not started   Mechanical Ventilation, Trach, PEG, Hemodialysis  Palliative Care follow-up planned  Yes, Facility      Time In: 1500 Time Out: 1610 Time Total: 70 min Greater than 50%  of this time was spent counseling and coordinating care related to the above assessment and plan.  Signed by: Dory Horn, NP   Please contact Palliative Medicine Team phone at [phone] for questions and concerns.  For individual provider: See Shea Evans

## 2016-10-26 NOTE — Evaluation (Signed)
Clinical/Bedside Swallow Evaluation Patient Details  Name: Deanna Washington MRN: [id number] Date of Birth: [birth year]  Today's Date: 10/26/2016 Time: SLP Start Time (ACUTE ONLY): 9924 SLP Stop Time (ACUTE ONLY): 1420 SLP Time Calculation (min) (ACUTE ONLY): 8 min  Past Medical History:  Past Medical History:  Diagnosis Date  . Asthma   . 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 Breast cancer [facility])    believes it was in 2010  . HTN (hypertension)   . 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 BREAST LUMPECTOMY     lumpectomy  . CRANIOTOMY Right 10/03/2016   Procedure: Right Occipital CRANIOTOMY TUMOR EXCISION with Curve;  Surgeon: Kary Kos, MD;  Location: [place];  Service: Neurosurgery;  Laterality: Right;   HPI:  Pt is a 76 y.o. female Caucasian right-handed with past medical history of HTN, paroxysmal atrial fibrillation,chronic lower extremity edema, asthma, stage IV metastatic breast cancer status post craniotomy resection of right occipital brain metastatic lesion on 10/03/2016 was admitted to the hospital for worsening bilateral leg edema. MRI results show acute subcentimeter R cerebrallar infarcts. Pt transferred to stepdown unit after medical changes, repeat CT showed progressive cerebrospinal fluid leakage through the scalp incision. Referred for swallowing evaluation.   Assessment / Plan / Recommendation Clinical Impression  Upon SLP entry, pt lethargic, with inhalatory stridor noted. With palapation of larynx/neck, noted swelling R>L; RN informed. Pt unable to follow commands for thorough oral motor examination; does open mouth with cues, requires total A for oral care. With single ice chip, pt accepts bolus and does eventually manipulate orally after brief holding. Laryngeal movement appears decreased to palpation. With swallow, pt has immediate weak cough, wheezing, suggestive of  reduced airway protection. At this time she is at high risk for aspiration given her deconditioning, current mental status, lethargy, CVA. Recommend she remain NPO pending update of goals of care; palliative consult pending.  SLP Visit Diagnosis: Dysphagia, oropharyngeal phase (R13.12)    Aspiration Risk  Severe aspiration risk;Risk for inadequate nutrition/hydration    Diet Recommendation NPO;Ice chips PRN after oral care        Other  Recommendations Recommended Consults: Other (Comment) (palliative consult pending) Oral Care Recommendations: Oral care QID;Oral care prior to ice chip/H20   Follow up Recommendations Other (comment) (TBD)      Frequency and Duration min 2x/week  2 weeks       Prognosis Prognosis for Safe Diet Advancement: Guarded Barriers to Reach Goals: Other (Comment) (medical prognosis)      Swallow Study   General Date of Onset: 10/20/2016 HPI: Pt is a 76 y.o. female Caucasian right-handed with past medical history of HTN, paroxysmal atrial fibrillation,chronic lower extremity edema, asthma, stage IV metastatic breast cancer status post craniotomy resection of right occipital brain metastatic lesion on 10/03/2016 was admitted to the hospital for worsening bilateral leg edema. MRI results show acute subcentimeter R cerebrallar infarcts. Pt transferred to stepdown unit after medical changes, repeat CT showed progressive cerebrospinal fluid leakage through the scalp incision. Referred for swallowing evaluation. Type of Study: Bedside Swallow Evaluation Previous Swallow Assessment: none in chart Diet Prior to this Study: NPO Temperature Spikes Noted: No Respiratory Status: Room air History of Recent Intubation: No Behavior/Cognition: Lethargic/Drowsy Oral Cavity Assessment: Dried secretions Oral Care Completed by SLP: Yes Oral Cavity - Dentition: Missing dentition Self-Feeding Abilities: Total assist Patient Positioning: Upright in bed Baseline Vocal  Quality:  Aphonic Volitional Cough: Cognitively unable to elicit Volitional Swallow: Unable to elicit    Oral/Motor/Sensory Function Overall Oral Motor/Sensory Function: Other (comment) (unable to assess due to cognition/lethargy)   Ice Chips Ice chips: Impaired Presentation: Spoon Oral Phase Impairments: Poor awareness of bolus Oral Phase Functional Implications: Oral holding;Prolonged oral transit Pharyngeal Phase Impairments: Cough - Immediate   Thin Liquid Thin Liquid: Not tested    Nectar Thick Nectar Thick Liquid: Not tested   Honey Thick Honey Thick Liquid: Not tested   Puree Puree: Not tested   Solid   GO   Solid: Not tested       Deneise Lever, MS, CCC-SLP Speech-Language Pathologist [phone]  Aliene Altes 10/26/2016,2:33 PM

## 2016-10-26 NOTE — Progress Notes (Signed)
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

## 2016-10-26 NOTE — Progress Notes (Signed)
[facility]                                                                              Patient Demographics  Deanna Washington, is a 76 y.o. female, DOB - 22-Jan-1941, WYO:[id number]  Admit date - 10/10/2016   Admitting Physician Kinnie Feil, MD  Outpatient Primary MD for the patient is Tisovec, Fransico Him, MD  Outpatient specialists:   LOS - 4  days   Medical records reviewed and are as summarized below:    Chief Complaint  Patient presents with  . [facility]  . Lethargic  . Leg Swelling       Brief summary   Deanna Washington is a 76 y.o. 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 Per family: patient developed worsening leg edema, associated with generalized diffuse swelling for several days. She is also noted to have more weakness in her bl legs,  unable to ambulate well. She reported right eye blurry vision for few days. MRI showed acute stroke   Assessment & Plan    Principal Problem:   Acute embolic stroke [facility]): Presented with worsening bilateral weakness in the legs, the setting of recent craniotomy, paroxysmal atrial fibrillation not on anticoagulation - MRI of the brain showed acute subcentimeter right cerebellar infarcts, less likely new metastasis - history of atrial fibrillation not on anticoagulation due to bleeding risk - carotid Dopplers 8/23 showed bilateral 1-39% ICA stenosis - 2-D echo showed EF of 60-65% with grade 1 diastolic dysfunction - After discussion with neurology and cleared by neurosurgery, patient was started on eliquis on 9/27, now 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   Active problems Acute encephalopathy, meningocele - On 9/28, patient had mental status changes,  stat CT head showed progressive subgaleal fluid collection around the right occipital bone, presumingly CSF, no hemorrhage or acute intracranial findings. Patient was transferred to stepdown unit. Neurologically intact - neurosurgery was consulted again to evaluate, per Dr Cyndy Freeze, no indication for surgery at this time - patient much more lethargic today, risk of aspiration, placed on NPO status - change to IV Decadron, Keppra - discussed in detail with patient's daughter, [name], appears to have overall poor prognosis. Patient's daughter is agreeable for palliative consult with goal for comfort care.    Metastasis to brain, status post craniotomy for resection of right occipital brain metastasis on 9/6 , seizures -neurology and neurosurgery following. Appreciate radiation oncology for following up and radiation which was scheduled on October 1, will now be deferred until the following week -change to IV dexamethasone and IV Keppra   bilateral lower extremity edema, Acute on chronic diastolic CHF, dyspnea  - Doppler ultrasound of the lower extremities negative for DVT - verified with the patient and daughter, patient has chronic right lower extremity edema for years but left LE edema new -negative balance of 6.2 L. Given patient is much more lethargic, NPO, decrease Lasix to daily, Will  DC if patient made comfort care    Multifocal pneumonia - CT angiogram chest negative for PE however showed slight worsening of mediastinal metastatic retinopathy, innumerable pulmonary nodules consistent with metastatic disease. Multifocal groundglass densityin the bilateral lungs most notable in the right upper lobe could reflect superimposed multifocal pneumonia, placed on Levaquin.    Asthma - currently stable, no wheezing    Atrial fibrillation, paroxysmal - currently rate controlled, on amiodarone, continue - placed on eliquis but now on hold secondary to meningocele  Code Status: DNR  DVT Prophylaxis:   SCD's Family Communication: Discussed in detail with the patient, all imaging results, lab results explained to the patient and daughter   Disposition Plan: continue stepdown unit  Time Spent in minutes  25 minutes  Procedures:    Consultants:   Neurology neurosurgery palliative medicine  Antimicrobials:      Medications  Scheduled Meds: . acetaZOLAMIDE  250 mg Oral BID  . amiodarone  100 mg Oral BID  . atorvastatin  40 mg Oral Daily  . dexamethasone  4 mg Intravenous Q6H  . [START ON 2016/11/24] furosemide  40 mg Intravenous Daily  . mouth rinse  15 mL Mouth Rinse BID  . mometasone-formoterol  2 puff Inhalation BID  . pantoprazole (PROTONIX) IV  40 mg Intravenous Q24H   Continuous Infusions: . levETIRAcetam    . levofloxacin (LEVAQUIN) IV Stopped (10/26/16 1047)   PRN Meds:.acetaminophen **OR** acetaminophen (TYLENOL) oral liquid 160 mg/5 mL **OR** acetaminophen, albuterol, hydrALAZINE, HYDROcodone-acetaminophen, LORazepam, morphine injection, ondansetron (ZOFRAN) IV   Antibiotics   Anti-infectives    Start     Dose/Rate Route Frequency Ordered Stop   10/24/16 1000  levofloxacin (LEVAQUIN) IVPB 750 mg     750 mg 100 mL/hr over 90 Minutes Intravenous Every 24 hours 10/24/16 0831          Subjective:   Deanna Washington was seen and examined today. Lethargic today,awake and oriented but too weak. Appears to be much worse from yesterday. Patient denies dizziness, chest pain, shortness of breath, abdominal pain, N/V/D.  No fevers or chills  Objective:   Vitals:   10/26/16 0337 10/26/16 0600 10/26/16 0742 10/26/16 0835  BP: (!) 158/104 (!) 157/98 (!) 156/97   Pulse: 89  90   Resp: 17  17   Temp: 97.6 F (36.4 C)  98.1 F (36.7 C)   TempSrc: Oral  Oral   SpO2: 96%  95% 96%  Weight: 68.9 kg (151 lb 14.4 oz)     Height:        Intake/Output Summary (Last 24 hours) at 10/26/16 1114 Last data filed at 10/26/16 0420  Gross per 24 hour  Intake               100 ml  Output             2025 ml  Net            -1925 ml     Wt Readings from Last 3 Encounters:  10/26/16 68.9 kg (151 lb 14.4 oz)  10/23/2016 77.6 kg (171 lb)  10/03/16 77.6 kg (171 lb)     Exam   General: awake however significantly lethargic from yesterday  Eyes: PERRLA, EOMI, Anicteric Sclera,  HEENT:  Swelling of meningocele  Cardiovascular: S1 S2 auscultated, no rubs, murmurs or gallops. Regular rate and rhythm. No pedal edema b/l  Respiratory: Clear to auscultation bilaterally, no wheezing, rales or rhonchi  Gastrointestinal: Soft, nontender, nondistended, + bowel sounds  Ext: no pedal edema bilaterally  Neuro: able to grip my fingers however did not lift her legs  Musculoskeletal: No digital cyanosis, clubbing  Skin: No rashes  Psych: lethargic   Data Reviewed:  I have personally reviewed following labs and imaging studies  Micro Results Recent Results (from the past 240 hour(s))  MRSA PCR Screening     Status: None   Collection Time: 10/25/16  8:16 PM  Result Value Ref Range Status   MRSA by PCR NEGATIVE NEGATIVE Final    Comment:        The GeneXpert MRSA Assay (FDA approved for NASAL specimens only), is one component of a comprehensive MRSA colonization surveillance program. It is not intended to diagnose MRSA infection nor to guide or monitor treatment for MRSA infections.     Radiology Reports Ct Head Wo Contrast  Result Date: 10/25/2016 CLINICAL DATA:  Craniotomy for tumor resection. Increased swelling at operative site. EXAM: CT HEAD WITHOUT CONTRAST TECHNIQUE: Contiguous axial images were obtained from the base of the skull through the vertex without intravenous contrast. COMPARISON:  Brain MRI from 3 days ago FINDINGS: Brain: Low-density resection cavity in the right occipital lobe. There is small extra-axial fluid accumulation without significant mass effect. Progressive low-density subgaleal fluid collection around the bone flap. No  evidence of acute infarct, acute hemorrhage, hydrocephalus, or shift. Patient has metastatic and ischemic findings by brain MRI, not visible on this scan Vascular: Atherosclerosis.  No hyperdense vessel. Skull: Stable positioning of right occipital parietal craniotomy flap. Sinuses/Orbits: No acute finding.  Bilateral cataract resection IMPRESSION: 1. Progressive subgaleal fluid collection around the right occipital bone flap, presumably CSF. 2. No acute intracranial finding. 3. Ischemic and metastatic findings in the brain by recent MRI that are not detectable or progressive by CT. Electronically Signed   By: Monte Fantasia M.D.   On: 10/25/2016 08:12   Ct Head Wo Contrast  Result Date: 10/26/2016 CLINICAL DATA:  Expressive aphasia, lethargy, difficulty standing, LEFT peripheral vision loss for 2 days. History of metastatic breast cancer on chemo radiation. Status post debulking of RIGHT occipital tumor. EXAM: CT HEAD WITHOUT CONTRAST TECHNIQUE: Contiguous axial images were obtained from the base of the skull through the vertex without intravenous contrast. COMPARISON:  MRI of the head October 04, 2016 and CT HEAD September 18, 2016 FINDINGS: BRAIN: RIGHT occipital resection cavity, re-expanded RIGHT occipital horn. No significant residual mass effect, surrounding low-density encephalomalacia. Moderate parenchymal brain volume loss for age. Patchy supratentorial white matter hypodensities, some of which reflect metastatic disease characterized on prior MRI. Subcentimeter LEFT temporal lobe metastasis better seen on prior MRI. No acute large vascular territory infarct. No intraparenchymal hemorrhage. No extra-axial fluid collections. Basal cisterns are patent. VASCULAR: Mild to moderate calcific atherosclerosis of the carotid siphons. SKULL: Status post RIGHT parieto-occipital craniotomy. RIGHT calvarial suspected metastasis better characterized on prior MRI. RIGHT parieto-occipital scalp fluid collection  measuring 6.2 x 1.4 cm. Borderline partially empty sella. SINUSES/ORBITS: Mild paranasal sinus mucosal thickening without air-fluid levels. Mastoid air cells are well aerated. The included ocular globes and orbital contents are non-suspicious. Status post bilateral ocular lens implants. OTHER: None. IMPRESSION: 1. Interval RIGHT craniotomy for debulking occipital lobe tumor, improved appearance with decreased mass effect and re-expanded RIGHT occipital horn. 2. 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 3. Moderate white matter changes consistent chronic small vessel ischemic disease and known metastasis. Electronically Signed   By: Elon Alas M.D.   On: 10/10/2016  16:47   Ct Angio Chest Pe W Or Wo Contrast  Result Date: 10/23/2016 CLINICAL DATA:  Short of breath, recent surgery EXAM: CT ANGIOGRAPHY CHEST WITH CONTRAST TECHNIQUE: Multidetector CT imaging of the chest was performed using the standard protocol during bolus administration of intravenous contrast. Multiplanar CT image reconstructions and MIPs were obtained to evaluate the vascular anatomy. CONTRAST:  75 mL Isovue 370 intravenous COMPARISON:  09/28/2016, CT chest 09/19/2016 FINDINGS: Cardiovascular: Satisfactory opacification of the pulmonary arteries to the segmental level. No evidence of pulmonary embolism. Ectatic ascending aorta, measuring up to 3.7 cm. No dissection is seen. Atherosclerotic calcifications. Enlarged pulmonary trunk, measuring up to 3.9 cm. Coronary artery calcification. Borderline cardiomegaly. No significant pericardial effusion. Mediastinum/Nodes: Re- demonstrated mediastinal adenopathy with increased size of previously noted abnormal lymph nodes. For example AP window soft tissue mass measures 3.9 x 2.3 cm, compared with 3.4 x 2.1 cm previously. Subcarinal mass measures 2.7 cm compared with 2.5 cm previously. Similar appearance of small right hilar nodes.  Midline trachea. No thyroid mass. Esophagus unremarkable. Lungs/Pleura: Interim development of multifocal ground-glass densities throughout the lungs, most notable in the right upper lobe. Multiple bilateral pulmonary nodules, also worrisome for metastatic disease with interval increase in size of several previously noted lesions, for example right middle lobe mass measures 2.2 x 1.6 cm, compared with 1.1 x 1.2 cm previously. A right central lung base mass measures 1.8 x 1.3 cm, compared with 1.5 x 1.2 cm previously. Right lower lobe/azygoesophageal mass measures 4.3 x 3 cm compared with 4.2 x 3.1 cm. Interval increase in multiple small pulmonary nodules in the right lower lobe and middle lobes. Increased contiguity of multiple left lower lobe pulmonary nodules. No pleural effusion or pneumothorax. Upper Abdomen: No acute abnormality. Musculoskeletal: Stable small sclerotic foci in T1 and T3. Review of the MIP images confirms the above findings. IMPRESSION: 1. Negative for acute pulmonary embolus or aortic dissection 2. Slight worsening of mediastinal metastatic adenopathy concerning for disease progression 3. Innumerable pulmonary nodules consistent with metastatic disease, increased number of nodules and size of pre-existing nodules also concerning for progression of disease. Interim development of multifocal ground-glass density within the bilateral lungs most notable in the right upper lobe, could reflect superimposed multifocal pneumonia. Aortic Atherosclerosis (ICD10-I70.0). Electronically Signed   By: Donavan Foil M.D.   On: 10/23/2016 20:27   Mr Brain Wo Contrast (neuro Protocol)  Result Date: 10/16/2016 CLINICAL DATA:  Altered level consciousness. History of metastatic breast cancer. Status post recent bulking of RIGHT occipital lobe metastasis. EXAM: MRI HEAD WITHOUT CONTRAST TECHNIQUE: Multiplanar, multiecho pulse sequences of the brain and surrounding structures were obtained without intravenous  contrast. COMPARISON:  CT HEAD October 22, 2016 at 1612 hours and MRI of the head October 04, 2016 and August 23rd 2018 and FINDINGS: BRAIN: Multiple new subcentimeter foci of reduced diffusion RIGHT cerebellum with low ADC values. Stable subcentimeter reduced diffusion and splenium of corpus callosum and RIGHT medial parietal lobe without ADC abnormality, possible mild nonenhancing tumor. Increasing fourth ventricle periventricular FLAIR T2 hyperintensities. Re- demonstration of LEFT cerebellar metastasis. Status post debulking of RIGHT occipital lobe tumor, with surrounding presumable gliosis and, potential residual tumor without mass effect. Susceptibility artifact within and along the margin of the resection cavity. No midline shift. Re-expanded RIGHT occipital horn and RIGHT atrium. No hydrocephalus. Similar patchy to confluent supratentorial white matter FLAIR T2 hyperintensities. LEFT parietal cortical T2 hyperintensity corresponding to known metastasis. No abnormal extra-axial fluid collections. Focal dural thickening subjacent to craniotomy.  VASCULAR: Normal major intracranial vascular flow voids present at skull base. SKULL AND UPPER CERVICAL SPINE: Scattered mildly expansile T2 bright foci within RIGHT calvarial diploic space concerning for metastatic disease. Status post RIGHT parieto-occipital craniotomy. Partially empty sella. The Craniocervical junction maintained. SINUSES/ORBITS: Trace mastoid effusions. Mild paranasal sinus mucosal thickening without air-fluid levels. The included ocular globes and orbital contents are non-suspicious. Status post bilateral ocular lens implants. OTHER: Larger RIGHT parietoccipital T2 bright fluid collection with susceptibility artifact overlying craniotomy, predominately of loosening signal on FLAIR sequence. IMPRESSION: 1. Acute subcentimeter RIGHT cerebellar infarcts, less likely new metastasis. 2. Interval debulking of RIGHT occipital tumor with improved  appearance, residual edema and potential tumor ; limited assessment without contrast. No residual mass effect or midline shift. 3. 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 4. Additional intracranial metastasis better characterized on prior MRI. 5. Moderate chronic small vessel ischemic disease. Electronically Signed   By: Elon Alas M.D.   On: 10/04/2016 17:27   Mr Jeri Cos LE Contrast  Result Date: 10/04/2016 CLINICAL DATA:  76 year old female postoperative day 1 stereotactic subtotal resection of a large right occipital brain tumor. Preliminary pathology suggesting breast cancer metastasis. Intraoperative evidence of tumor extension to the trigone of the lateral ventricle. EXAM: MRI HEAD WITHOUT AND WITH CONTRAST TECHNIQUE: Multiplanar, multiecho pulse sequences of the brain and surrounding structures were obtained without and with intravenous contrast. CONTRAST:  25mL MULTIHANCE GADOBENATE DIMEGLUMINE 529 MG/ML IV SOLN COMPARISON:  Preoperative brain MRI 09/28/2016 and earlier. FINDINGS: Brain: Right posterior paramidline craniotomy site with underlying small extra-axial air in fluid collection up to 11 mm in thickness. Subjacent resection cavity with a combination of fluid and gas. The resection cavity extends to the atrium of the right lateral ventricle just posterior to the choroid plexus. There is pneumoventricle, with gas in both frontal horns, but no ventriculomegaly. Small volume pneumocephalus along the right frontal convexity. A small peninsula of residual brain parenchyma along the superior aspect of the resection cavity demonstrates restricted diffusion (series 3, image 28). There otherwise several small foci of restricted along the right splenium of the corpus callosum diffusion. Postcontrast enhancement at the resection cavity is nodular and curvilinear along the anterior margin and junction with the atrium of the right lateral ventricle, best  demonstrated on series 11, image 27, series 12, image 8 and series 13, image 8. Mild surrounding T2 and FLAIR hyperintensity with largely resolved mass effect on the adjacent ventricle. Stable 10 mm enhancing left cerebellar metastasis on series 11, image 9. Continued confluent abnormal enhancement in both internal auditory canals, more so the left (series 11, images 12 and 13). Stable to slightly decreased punctate and curvilinear foci of enhancement along the left parietal lobe which are favored to be post ischemic rather than metastatic. No other enhancement suspicious for metastatic disease. Scattered nonspecific cerebral white matter T2 and FLAIR hyperintensity appears stable. No restricted diffusion to suggest acute infarction. No midline shift. Basilar cisterns remain patent. Cervicomedullary junction and pituitary are within normal limits. Vascular: Major intracranial vascular flow voids are stable aside from some extrinsic mass effect on the superior sagittal sinus near the craniectomy site. Skull and upper cervical spine: Negative visualized cervical spine. Stable heterogeneous calvarium bone marrow signal. Bone marrow signal at the skullbase and in the visible cervical spine remains normal. Sinuses/Orbits: Stable and negative orbits soft tissues. Mild paranasal sinus mucosal thickening is stable. Stable trace left mastoid effusion. Other: Postoperative changes to the superior and posterior scalp soft tissues  including small scalp hematoma. IMPRESSION: 1. Significant debulking of the large right occipital lobe tumor. Small volume of residual suspicious enhancement along the junction of the anterior resection cavity and atrium of the right lateral ventricle. Several small areas of postoperative restricted diffusion as seen on series 3. 2. Stable appearance of presumed metastatic disease in the left cerebellum and both internal auditory canals. No new leptomeningeal disease or brain metastasis identified. 3.  Multiple punctate and small linear foci of cortical enhancement in the left parietal and posterior frontal lobes are still favored to be post ischemic in nature, but will require MRI surveillance. Electronically Signed   By: Genevie Ann M.D.   On: 10/04/2016 12:47   Mr Jeri Cos ZO Contrast  Result Date: 09/28/2016 CLINICAL DATA:  76 year old female with metastatic disease to the brain and in the chest. Subsequent encounter. EXAM: MRI HEAD WITHOUT AND WITH CONTRAST TECHNIQUE: Multiplanar, multiecho pulse sequences of the brain and surrounding structures were obtained without and with intravenous contrast. CONTRAST:  5mL MULTIHANCE GADOBENATE DIMEGLUMINE 529 MG/ML IV SOLN COMPARISON:  Brain MRI 09/19/2016. FINDINGS: Brain: Large heterogeneously enhancing right occipital lobe mass with extension to the atrium of the right lateral ventricle today encompasses 70 x 48 by 69 mm (AP by transverse by CC) versus 72 x 50 x 70 mm previously at a comparable level. Note that there is stable conspicuous increased enhancement at the ependyma of the posterior right lateral ventricle (series 11, image 96). Regional mass effect including on the posterior right lateral ventricle appears stable. 10 mm central metastasis in the left cerebellar hemisphere is stable in size. No regional mass effect. There are multiple small cortically based areas of enhancement in the left occipital pole which appear to correspond to the small diffusion restricted areas thought to be ischemia on 09/19/2016 (e.g. Series 11, image 53). Of note, Of note, there are also multiple similar small cortically based areas of enhancement in the left parietal lobe, and these are in proximity to the small round cortically based enhancement which is stable on series 11, image 107 and which was thought to be metastatic on 09/19/2016. Evidence of new abnormal internal auditory canal enhancement left greater than right (series 11, images 43 and 44). This is also visible on  the coronal and sagittal post-contrast images. No no other intracranial metastatic disease identified. No leptomeningeal enhancement identified elsewhere. There is a new small focus of restricted diffusion in the left superolateral frontal lobe on series 5, image 44) without enhancement). There is also an increased focus of restricted diffusion in the central splenium of the corpus callosum on series 5, image 32 (no enhancement). No other ischemic related restricted diffusion. Stable intracranial mass effect and scattered nonenhancing nonspecific bilateral cerebral white matter T2 and FLAIR hyperintense foci. No acute intracranial hemorrhage identified. Negative pituitary and cervicomedullary junction. Vascular: Major intracranial vascular flow voids are stable. Skull and upper cervical spine: Indeterminate 15 mm enhancing right posterior calvarium lesion appears stable on series 11, image 100. There are additional smaller right calvarial enhancing lesions (such as on series 11, image 117. Negative visualized cervical spine and spinal cord. Sinuses/Orbits: Stable and negative. Other: Mastoids remain clear.  Negative scalp soft tissues. IMPRESSION: 1. Large 7 cm right occipital lobe enhancing mass with associated mass effect is stable since 09/19/2016. Note associated posterior right lateral ventricle ependymal enhancement suspicious for extension along the ventricle. 2. New abnormal bilateral Internal Auditory Canal enhancement highly suspicious for leptomeningeal metastatic disease, although no other leptomeningeal enhancement  is identified. 3. 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 4. Multiple small cortically based areas of enhancement along the posterior left hemisphere appear to be post ischemic. This raises the possibility that the punctate enhancement (presumed third brain metastasis on 09/20/2015) in the left parietal lobe is instead post-ischemic. 5. New small acute lacunar infarct in the posterior  left frontal lobe (series 5, image 44). Electronically Signed   By: Genevie Ann M.D.   On: 09/28/2016 18:14   Dg Chest Port 1 View  Result Date: 10/21/2016 CLINICAL DATA:  Bilateral lower extremity swelling. EXAM: PORTABLE CHEST 1 VIEW COMPARISON:  09/18/2016. FINDINGS: Mediastinum and hilar structures normal. Cardiomegaly with mild bilateral interstitial prominence and small bilateral pleural effusions findings consistent with congestive heart failure. Surgical clips right chest. IMPRESSION: Congestive heart failure with bilateral pulmonary interstitial edema and small bilateral pleural effusions similar findings noted on prior study of 09/18/2016. Electronically Signed   By: Marcello Moores  Register   On: 10/20/2016 15:22    Lab Data:  CBC:  Recent Labs Lab 10/17/2016 1548  WBC 14.0*  NEUTROABS 12.7*  HGB 15.5*  HCT 44.3  MCV 90.8  PLT 144   Basic Metabolic Panel:  Recent Labs Lab 10/11/2016 1548 10/24/16 0527 10/25/16 0813 10/26/16 0525  NA 132* 129* 129* 132*  K 4.0 3.8 3.6 3.5  CL 96* 97* 95* 94*  CO2 26 23 23 25   GLUCOSE 127* 132* 129* 170*  BUN 19 14 19  34*  CREATININE 0.67 0.55 0.71 0.92  CALCIUM 8.6* 8.8* 9.0 9.2   GFR: Estimated Creatinine Clearance: 54.1 mL/min (by C-G formula based on SCr of 0.92 mg/dL). Liver Function Tests:  Recent Labs Lab 10/17/2016 1548  AST 25  ALT 67*  ALKPHOS 94  BILITOT 0.6  PROT 5.8*  ALBUMIN 3.2*   No results for input(s): LIPASE, AMYLASE in the last 168 hours. No results for input(s): AMMONIA in the last 168 hours. Coagulation Profile: No results for input(s): INR, PROTIME in the last 168 hours. Cardiac Enzymes: No results for input(s): CKTOTAL, CKMB, CKMBINDEX, TROPONINI in the last 168 hours. BNP (last 3 results) No results for input(s): PROBNP in the last 8760 hours. HbA1C: No results for input(s): HGBA1C in the last 72 hours. CBG:  Recent Labs Lab 10/07/2016 1555  GLUCAP 128*   Lipid Profile: No results for input(s): CHOL,  HDL, LDLCALC, TRIG, CHOLHDL, LDLDIRECT in the last 72 hours. Thyroid Function Tests: No results for input(s): TSH, T4TOTAL, FREET4, T3FREE, THYROIDAB in the last 72 hours. Anemia Panel: No results for input(s): VITAMINB12, FOLATE, FERRITIN, TIBC, IRON, RETICCTPCT in the last 72 hours. Urine analysis:    Component Value Date/Time   COLORURINE YELLOW 10/15/2016 1548   APPEARANCEUR HAZY (A) 10/08/2016 1548   LABSPEC 1.010 10/15/2016 1548   PHURINE 7.0 10/05/2016 [place] 10/26/2016 [place] 10/10/2016 [place] 10/05/2016 1548   KETONESUR NEGATIVE 10/06/2016 1548   PROTEINUR NEGATIVE 10/12/2016 1548   NITRITE NEGATIVE 10/21/2016 1548   LEUKOCYTESUR NEGATIVE 10/04/2016 1548     Branndon Tuite M.D. [facility] 10/26/2016, 11:14 AM  Pager: [phone] Between 7am to 7pm - call Pager - [phone]  After 7pm go to [email or web address] - password [email or web address]  Call night coverage person covering after 7pm

## 2016-10-26 NOTE — Progress Notes (Signed)
STROKE TEAM PROGRESS NOTE   SUBJECTIVE (INTERVAL HISTORY) No family at bedside, she is alert and resting comfortably in chair. Repeat CT showed progressive cerebrospinal fluid leakage through the scalp incision. NSG was called for recommendations and will monitor.    OBJECTIVE Temp:  [97.4 F (36.3 C)-98.5 F (36.9 C)] 98.1 F (36.7 C) (09/29 0742) Pulse Rate:  [68-93] 90 (09/29 0742) Cardiac Rhythm: Normal sinus rhythm (09/28 2000) Resp:  [16-24] 17 (09/29 0742) BP: (129-160)/(82-104) 156/97 (09/29 0742) SpO2:  [87 %-96 %] 95 % (09/29 0742) Weight:  [151 lb 14.4 oz (68.9 kg)] 151 lb 14.4 oz (68.9 kg) (09/29 0337)  CBC:   Recent Labs Lab 10/24/2016 1548  WBC 14.0*  NEUTROABS 12.7*  HGB 15.5*  HCT 44.3  MCV 90.8  PLT 825    Basic Metabolic Panel:   Recent Labs Lab 10/25/16 0813 10/26/16 0525  NA 129* 132*  K 3.6 3.5  CL 95* 94*  CO2 23 25  GLUCOSE 129* 170*  BUN 19 34*  CREATININE 0.71 0.92  CALCIUM 9.0 9.2    Lipid Panel:     Component Value Date/Time   CHOL 205 (H) 10/23/2016 0520   TRIG 89 10/23/2016 0520   HDL 72 10/23/2016 0520   CHOLHDL 2.8 10/23/2016 0520   VLDL 18 10/23/2016 0520   LDLCALC 115 (H) 10/23/2016 0520   HgbA1c:  Lab Results  Component Value Date   HGBA1C 5.9 (H) 10/23/2016   Urine Drug Screen: No results found for: LABOPIA, COCAINSCRNUR, LABBENZ, AMPHETMU, THCU, LABBARB  Alcohol Level No results found for: [place] I have personally reviewed the radiological images below and agree with the radiology interpretations.  Ct Head Wo Contrast 10/26/2016 IMPRESSION: 1. Interval RIGHT craniotomy for debulking occipital lobe tumor, improved appearance with decreased mass effect and re-expanded RIGHT occipital horn. 2. 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 3. Moderate white matter changes consistent chronic small vessel ischemic disease and known metastasis.  Mr Brain  Wo Contrast (neuro Protocol) 10/01/2016 IMPRESSION: 1. Acute subcentimeter RIGHT cerebellar infarcts, less likely new metastasis. 2. Interval debulking of RIGHT occipital tumor with improved appearance, residual edema and potential tumor ; limited assessment without contrast. No residual mass effect or midline shift. 3. 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 4. Additional intracranial metastasis better characterized on prior MRI. 5. Moderate chronic small vessel ischemic disease.  Dg Chest Port 1 View 09/29/2016 IMPRESSION: Congestive heart failure with bilateral pulmonary interstitial edema and small bilateral pleural effusions similar findings noted on prior study of 09/18/2016.  Lower Extremity US (DVT) 10/24/2016 - No evidence of deep vein or superficial thrombosis involving the   right lower extremity and left lower extremity. - No evidence of Baker&'s cyst on the right or left.  Carotid US 09/20/2016 Findings suggest 1-39% internal carotid artery stenosis bilaterally. Vertebral arteries are patent with antegrade flow.  TTE 09/19/2016 Study Conclusions - Left ventricle: The cavity size was normal. There was mild concentric hypertrophy. Systolic function was normal. The estimated ejection fraction was in the range of 60% to 65%. Wall motion was normal; there were no regional wall motion abnormalities. Doppler parameters are consistent with abnormal left ventricular relaxation (grade 1 diastolic dysfunction).  There was no evidence of elevated ventricular filling pressure by Doppler parameters. - Aortic valve: There was mild regurgitation. - Aortic root: The aortic root was normal in size. - Mitral valve: There was trivial regurgitation. - Left atrium: The  atrium was normal in size. - Right atrium: The atrium was mildly dilated. - Tricuspid valve: There was moderate regurgitation. - Pulmonic valve: There was no regurgitation. - Pulmonary arteries:  Systolic pressure was moderately increased.  PA peak pressure: 45 mm Hg (S). - Inferior vena cava: The vessel was dilated. The respirophasic diameter changes were blunted (< 50%), consistent with elevated central venous pressure. - Pericardium, extracardiac: A trivial pericardial effusion was identified posterior to the heart. Features were not consistent with tamponade physiology. Impressions: - No cardiac source of emboli was indentified.  Ct Head Wo Contrast 10/25/2016 IMPRESSION: 1. Progressive subgaleal fluid collection around the right occipital bone flap, presumably CSF. 2. No acute intracranial finding. 3. Ischemic and metastatic findings in the brain by recent MRI that are not detectable or progressive by CT.   PHYSICAL EXAM  Temp:  [97.4 F (36.3 C)-98.5 F (36.9 C)] 98.1 F (36.7 C) (09/29 0742) Pulse Rate:  [68-93] 90 (09/29 0742) Resp:  [16-24] 17 (09/29 0742) BP: (129-160)/(82-104) 156/97 (09/29 0742) SpO2:  [87 %-96 %] 95 % (09/29 0742) Weight:  [151 lb 14.4 oz (68.9 kg)] 151 lb 14.4 oz (68.9 kg) (09/29 0337)  General - Well nourished, well developed, sitting in chair, mildly lethargic.  Ophthalmologic - Fundi not visualized due to mildly acute distress.  Cardiovascular - Regular rate and rhythm, not in A. fib.  Mental Status -  Mildly lethargic but easily arousable, orientation to time, place, and person were intact. Language including expression, naming, repetition, comprehension was assessed and found intact, but mild dysarthria.  Cranial Nerves II - XII - II - Visual field intact OU. III, IV, VI - Extraocular movements intact. Chronic disconjugate eyes without diplopia V - Facial sensation intact bilaterally. VII - right nasolabial fold flattening. VIII - Hearing & vestibular intact bilaterally. X - Palate elevates symmetrically, mild dysarthria. XI - Chin turning & shoulder shrug intact bilaterally. XII - Tongue protrusion intact.  Motor Strength - The  patient's strength was normal in both upper extremities and pronator drift was absent. Bilateral lower extremity swollen, 3/5 LLE, and 3-/5 RLE.  Bulk was normal and fasciculations were absent.   Motor Tone - Muscle tone was assessed at the neck and appendages and was normal.  Reflexes - The patient's reflexes were 1+ in all extremities and she had no pathological reflexes.  Sensory - Light touch, temperature/pinprick were assessed and were symmetrical.    Coordination - The patient had left arm dysmetria.  Tremor was absent.  Gait and Station - not tested.   ASSESSMENT/PLAN Ms. Deanna Washington is a 76 y.o. female with history of  HTN, paroxysmal atrial fibrillation,chronic lower extremity edema, asthma, stage IV metastatic breast cancer status post craniotomy resection of right occipital brain metastatic lesion on 10/03/2016 was admitted to by her neurosurgeon after presenting with BLE edema and confusion.   She did not receive IV t-PA due to recent neurosurgery.   Stroke, incidental finding: Acute subcentimeter RIGHT cerebellar infarcts, likely embolic, in the setting of metastatic cancer/hypercoagulable state and PAF with anticoagulation held for recent intracranial tumor debulking surgery.  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  MRI head: Acute subcentimeter RIGHT cerebellar infarcts  Carotid Doppler 09/20/16 unremarkable   2D Echo (09/19/2016): EF 60-65%. No source of embolus  LDL 115  HgbA1c 5.9  SCDs for VTE prophylaxis Diet Heart Room service appropriate? Yes; Fluid  consistency: Thin  No antithrombotic prior to admission, put on eliquis. Now eliquis was on hold due to acute progressive CSF leakage through the scalp incision.  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, brain and esophagus  Oncology follow-up   Recent intracranial tumor debulking surgery   Follow-up with neurosurgery   Whole brain radiation as outpatient as scheduled   PAF  Not on AC due to recent intracranial debulking surgery  Put on eliquis   Now on hold in case NSG will need further procedure due to subgaleal fluid collection  Hypertension  Stable BP goal normotensive  Hyperlipidemia  Home meds: atorvastatin 20 mg PO daily, esumed in hospital  LDL 115, goal < 70  Increase statin dose to atorvastatin 40 mg PO daily  Continue statin at discharge  Other Stroke Risk Factors  Advanced age  ETOH use, advised to drink no more than 1 drink(s) a day  Other Active Problems  Hyponatremia  Leukocytosis  Hospital day # 4   Neurology will sign off on this patient, please re-consult if necessary  To contact Stroke Continuity provider, please refer to [email or web address] After hours, contact General Neurology

## 2016-10-26 NOTE — Progress Notes (Signed)
MD notified pt not safe to swallow, new order received for NPO diet status.

## 2016-10-26 NOTE — Progress Notes (Signed)
Called in room by SLP to examine patients neck which appears more swollen than earlier assessment. Patient denies distress, RR 18, sats 97%. MD made aware.

## 2016-10-26 NOTE — Progress Notes (Signed)
Palliative consult put in by MD Rai - this RN contacted palliative medicine to see if they could come by earlier today due to assessment findings of knees and feet cold, knees mottled, diffuse rhonchi all over, patient with delayed swallowing, pocketing applesauce, refuses food/water and increased lethargy. Palliative medicine team is making appointment with family members for later today.

## 2016-10-27 DIAGNOSIS — C7931 Secondary malignant neoplasm of brain: Secondary | ICD-10-CM

## 2016-10-27 MED ORDER — MORPHINE SULFATE (PF) 4 MG/ML IV SOLN
2.0000 mg | Freq: Four times a day (QID) | INTRAVENOUS | Status: DC
  Administered 2016-10-27: 2 mg via INTRAVENOUS
  Filled 2016-10-27: qty 1

## 2016-10-27 MED ORDER — GLYCOPYRROLATE 0.2 MG/ML IJ SOLN: 0.2000 mg | Freq: Three times a day (TID) | INTRAMUSCULAR | Status: DC | PRN

## 2016-10-27 MED ORDER — GLYCOPYRROLATE 0.2 MG/ML IJ SOLN
0.2000 mg | Freq: Four times a day (QID) | INTRAMUSCULAR | Status: DC
  Administered 2016-10-27 (×2): 0.2 mg via INTRAVENOUS
  Filled 2016-10-27 (×2): qty 1

## 2016-10-28 ENCOUNTER — Other Ambulatory Visit: Payer: Medicare Other

## 2016-10-28 ENCOUNTER — Ambulatory Visit: Payer: Medicare Other | Admitting: Physician Assistant

## 2016-10-28 ENCOUNTER — Ambulatory Visit: Admission: RE | Admit: 2016-10-28 | Payer: Medicare Other | Source: Ambulatory Visit | Admitting: Radiation Oncology

## 2016-10-28 ENCOUNTER — Ambulatory Visit (HOSPITAL_COMMUNITY): Payer: Medicare Other

## 2016-10-28 ENCOUNTER — Encounter: Payer: Self-pay | Admitting: Oncology

## 2016-10-28 NOTE — Progress Notes (Signed)
Patient has expired. There are no respirations, no heart sounds, no pulses, no corneal reflexes.  Pt 2 daughters, 1 granddaughter, and husband at bedside, appropriately grieving.   2 RNs listened for confirmation of no heart sounds, respirations.  Patients husband does not want small yellow band on patients left ring finger removed. Patient will be transported to morgue with it still on.   Patients family have been given the patient placement number to call once they decide which funeral home to use.  MD Rai has been notified.

## 2016-10-28 NOTE — Telephone Encounter (Signed)
I was unable to reach [place] until today after the passing of her mother. I expressed our condolences and shared that we would be happy to assist the family if they have any needs or concerns moving forward.

## 2016-10-28 NOTE — Progress Notes (Signed)
[facility]                                                                              Patient Demographics  Deanna Washington, is a 76 y.o. female, DOB - [birth year], CZY:[id number]  Admit date - 10/14/2016   Admitting Physician Kinnie Feil, MD  Outpatient Primary MD for the patient is Tisovec, Fransico Him, MD  Outpatient specialists:   LOS - 5  days   Medical records reviewed and are as summarized below:    Chief Complaint  Patient presents with  . [facility]  . Lethargic  . Leg Swelling       Brief summary   Deanna Washington is a 76 y.o. 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 Per family: patient developed worsening leg edema, associated with generalized diffuse swelling for several days. She is also noted to have more weakness in her bl legs,  unable to ambulate well. She reported right eye blurry vision for few days. MRI showed acute stroke   Assessment & Plan    Principal Problem:   Acute embolic stroke [facility]): Presented with worsening bilateral weakness in the legs, the setting of recent craniotomy, paroxysmal atrial fibrillation not on anticoagulation - MRI of the brain showed acute subcentimeter right cerebellar infarcts, less likely new metastasis - history of atrial fibrillation not on anticoagulation due to bleeding risk - carotid Dopplers 8/23 showed bilateral 1-39% ICA stenosis - 2-D echo showed EF of 60-65% with grade 1 diastolic dysfunction - After discussion with neurology and cleared by neurosurgery, patient was started on eliquis on 9/27, now 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   Active problems Acute encephalopathy, meningocele - On 9/28, patient had mental status changes,  stat CT head showed progressive subgaleal fluid collection around the right occipital bone, presumingly CSF, no hemorrhage or acute intracranial findings. The patient was transferred to stepdown unit. Evaluated by neurosurgery and no indication for surgery at this time. Continued on IV Decadron and Keppra. - Patient continues to have worsening neurological status, more lethargic.overall poor prognosis. Palliative medicine was consulted. Discussed in detail with patient's daughter, [name] and patient's son, they are anticipating family from [place] and moving towards full comfort care    Metastasis to brain, status post craniotomy for resection of right occipital brain metastasis on 9/6 , seizures -neurology and neurosurgery following. Radiation oncology placed on hold. - Currently on IV dexamethasone and IV Keppra, family transitioning patient to full comfort care   bilateral lower extremity edema, Acute on chronic diastolic CHF, dyspnea  - Doppler ultrasound of the lower extremities negative for DVT - negative balance of 6 L. Now transitioning towards full comfort care  Multifocal pneumonia - CT angiogram chest negative for PE however showed slight worsening of mediastinal metastatic retinopathy, innumerable pulmonary nodules consistent with metastatic disease. Multifocal groundglass densityin the bilateral lungs most notable in the right upper lobe could reflect superimposed multifocal  pneumonia, placed on Levaquin.    Asthma - currently stable, no wheezing    Atrial fibrillation, paroxysmal - currently rate controlled, on amiodarone, continue - placed on eliquis but now on hold secondary to meningocele  Code Status: DNR  DVT Prophylaxis:  SCD's Family Communication: Discussed in detail with the patient, all imaging results, lab results explained to the patient's daughter and son at the bedside  Disposition Plan: will transfer to telemetry floor once transitioned to full comfort  care  Time Spent in minutes  25 minutes  Procedures:    Consultants:   Neurology neurosurgery palliative medicine  Antimicrobials:      Medications  Scheduled Meds: . acetaZOLAMIDE  250 mg Oral BID  . amiodarone  100 mg Oral BID  . atorvastatin  40 mg Oral Daily  . dexamethasone  4 mg Intravenous Q6H  . furosemide  40 mg Intravenous Daily  . glycopyrrolate  0.2 mg Intravenous Q6H  . mouth rinse  15 mL Mouth Rinse BID  . mometasone-formoterol  2 puff Inhalation BID  .  morphine injection  2 mg Intravenous Q6H  . pantoprazole (PROTONIX) IV  40 mg Intravenous Q24H   Continuous Infusions: . levETIRAcetam Stopped (23-Nov-2016 1024)  . levofloxacin (LEVAQUIN) IV Stopped (Nov 23, 2016 1139)   PRN Meds:.acetaminophen **OR** acetaminophen (TYLENOL) oral liquid 160 mg/5 mL **OR** acetaminophen, albuterol, glycopyrrolate, hydrALAZINE, LORazepam, morphine injection, ondansetron (ZOFRAN) IV   Antibiotics   Anti-infectives    Start     Dose/Rate Route Frequency Ordered Stop   10/24/16 1000  levofloxacin (LEVAQUIN) IVPB 750 mg     750 mg 100 mL/hr over 90 Minutes Intravenous Every 24 hours 10/24/16 0831          Subjective:   Deanna Washington was seen and examined today. Lethargic, somnolent with morphine recently received. Daughter and the son at the bedside. Neurologically she looks much worse.  Objective:   Vitals:   11/23/2016 0316 Nov 23, 2016 0400 11-23-16 0500 11-23-2016 1010  BP:    (!) 151/91  Pulse:  83 88 100  Resp:  (!) 23 (!) 31 (!) 21  Temp:      TempSrc:      SpO2:  95% 96% 94%  Weight: 68.8 kg (151 lb 10.8 oz)     Height:        Intake/Output Summary (Last 24 hours) at 2016-11-23 1250 Last data filed at 11/23/16 0600  Gross per 24 hour  Intake              105 ml  Output                0 ml  Net              105 ml     Wt Readings from Last 3 Encounters:  11/23/16 68.8 kg (151 lb 10.8 oz)  10/23/2016 77.6 kg (171 lb)  10/03/16 77.6 kg (171 lb)      Exam General: somnolent, lethargic Eyes:  HEENT:  Cardiovascular: S1 S2 clear, RRR Respiratory: decreased breath sounds at the bases Gastrointestinal: Soft, nontender, nondistended, + bowel sounds Ext: no pedal edema bilaterally Neuro: somnolent, not following verbal commands Musculoskeletal: No digital cyanosis, clubbing Skin: No rashes Psych: somnolent     Data Reviewed:  I have personally reviewed following labs and imaging studies  Micro Results Recent Results (from the past 240 hour(s))  MRSA PCR Screening     Status: None   Collection Time: 10/25/16  8:16 PM  Result Value  Ref Range Status   MRSA by PCR NEGATIVE NEGATIVE Final    Comment:        The GeneXpert MRSA Assay (FDA approved for NASAL specimens only), is one component of a comprehensive MRSA colonization surveillance program. It is not intended to diagnose MRSA infection nor to guide or monitor treatment for MRSA infections.     Radiology Reports Ct Head Wo Contrast  Result Date: 10/25/2016 CLINICAL DATA:  Craniotomy for tumor resection. Increased swelling at operative site. EXAM: CT HEAD WITHOUT CONTRAST TECHNIQUE: Contiguous axial images were obtained from the base of the skull through the vertex without intravenous contrast. COMPARISON:  Brain MRI from 3 days ago FINDINGS: Brain: Low-density resection cavity in the right occipital lobe. There is small extra-axial fluid accumulation without significant mass effect. Progressive low-density subgaleal fluid collection around the bone flap. No evidence of acute infarct, acute hemorrhage, hydrocephalus, or shift. Patient has metastatic and ischemic findings by brain MRI, not visible on this scan Vascular: Atherosclerosis.  No hyperdense vessel. Skull: Stable positioning of right occipital parietal craniotomy flap. Sinuses/Orbits: No acute finding.  Bilateral cataract resection IMPRESSION: 1. Progressive subgaleal fluid collection around the right occipital  bone flap, presumably CSF. 2. No acute intracranial finding. 3. Ischemic and metastatic findings in the brain by recent MRI that are not detectable or progressive by CT. Electronically Signed   By: Monte Fantasia M.D.   On: 10/25/2016 08:12   Ct Head Wo Contrast  Result Date: 10/07/2016 CLINICAL DATA:  Expressive aphasia, lethargy, difficulty standing, LEFT peripheral vision loss for 2 days. History of metastatic breast cancer on chemo radiation. Status post debulking of RIGHT occipital tumor. EXAM: CT HEAD WITHOUT CONTRAST TECHNIQUE: Contiguous axial images were obtained from the base of the skull through the vertex without intravenous contrast. COMPARISON:  MRI of the head October 04, 2016 and CT HEAD September 18, 2016 FINDINGS: BRAIN: RIGHT occipital resection cavity, re-expanded RIGHT occipital horn. No significant residual mass effect, surrounding low-density encephalomalacia. Moderate parenchymal brain volume loss for age. Patchy supratentorial white matter hypodensities, some of which reflect metastatic disease characterized on prior MRI. Subcentimeter LEFT temporal lobe metastasis better seen on prior MRI. No acute large vascular territory infarct. No intraparenchymal hemorrhage. No extra-axial fluid collections. Basal cisterns are patent. VASCULAR: Mild to moderate calcific atherosclerosis of the carotid siphons. SKULL: Status post RIGHT parieto-occipital craniotomy. RIGHT calvarial suspected metastasis better characterized on prior MRI. RIGHT parieto-occipital scalp fluid collection measuring 6.2 x 1.4 cm. Borderline partially empty sella. SINUSES/ORBITS: Mild paranasal sinus mucosal thickening without air-fluid levels. Mastoid air cells are well aerated. The included ocular globes and orbital contents are non-suspicious. Status post bilateral ocular lens implants. OTHER: None. IMPRESSION: 1. Interval RIGHT craniotomy for debulking occipital lobe tumor, improved appearance with decreased mass effect  and re-expanded RIGHT occipital horn. 2. 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 3. Moderate white matter changes consistent chronic small vessel ischemic disease and known metastasis. Electronically Signed   By: Elon Alas M.D.   On: 10/05/2016 16:47   Ct Angio Chest Pe W Or Wo Contrast  Result Date: 10/23/2016 CLINICAL DATA:  Short of breath, recent surgery EXAM: CT ANGIOGRAPHY CHEST WITH CONTRAST TECHNIQUE: Multidetector CT imaging of the chest was performed using the standard protocol during bolus administration of intravenous contrast. Multiplanar CT image reconstructions and MIPs were obtained to evaluate the vascular anatomy. CONTRAST:  75 mL Isovue 370 intravenous COMPARISON:  10/21/2016, CT chest 09/19/2016 FINDINGS: Cardiovascular:  Satisfactory opacification of the pulmonary arteries to the segmental level. No evidence of pulmonary embolism. Ectatic ascending aorta, measuring up to 3.7 cm. No dissection is seen. Atherosclerotic calcifications. Enlarged pulmonary trunk, measuring up to 3.9 cm. Coronary artery calcification. Borderline cardiomegaly. No significant pericardial effusion. Mediastinum/Nodes: Re- demonstrated mediastinal adenopathy with increased size of previously noted abnormal lymph nodes. For example AP window soft tissue mass measures 3.9 x 2.3 cm, compared with 3.4 x 2.1 cm previously. Subcarinal mass measures 2.7 cm compared with 2.5 cm previously. Similar appearance of small right hilar nodes. Midline trachea. No thyroid mass. Esophagus unremarkable. Lungs/Pleura: Interim development of multifocal ground-glass densities throughout the lungs, most notable in the right upper lobe. Multiple bilateral pulmonary nodules, also worrisome for metastatic disease with interval increase in size of several previously noted lesions, for example right middle lobe mass measures 2.2 x 1.6 cm, compared with 1.1 x 1.2 cm previously.  A right central lung base mass measures 1.8 x 1.3 cm, compared with 1.5 x 1.2 cm previously. Right lower lobe/azygoesophageal mass measures 4.3 x 3 cm compared with 4.2 x 3.1 cm. Interval increase in multiple small pulmonary nodules in the right lower lobe and middle lobes. Increased contiguity of multiple left lower lobe pulmonary nodules. No pleural effusion or pneumothorax. Upper Abdomen: No acute abnormality. Musculoskeletal: Stable small sclerotic foci in T1 and T3. Review of the MIP images confirms the above findings. IMPRESSION: 1. Negative for acute pulmonary embolus or aortic dissection 2. Slight worsening of mediastinal metastatic adenopathy concerning for disease progression 3. Innumerable pulmonary nodules consistent with metastatic disease, increased number of nodules and size of pre-existing nodules also concerning for progression of disease. Interim development of multifocal ground-glass density within the bilateral lungs most notable in the right upper lobe, could reflect superimposed multifocal pneumonia. Aortic Atherosclerosis (ICD10-I70.0). Electronically Signed   By: Donavan Foil M.D.   On: 10/23/2016 20:27   Mr Brain Wo Contrast (neuro Protocol)  Result Date: 10/08/2016 CLINICAL DATA:  Altered level consciousness. History of metastatic breast cancer. Status post recent bulking of RIGHT occipital lobe metastasis. EXAM: MRI HEAD WITHOUT CONTRAST TECHNIQUE: Multiplanar, multiecho pulse sequences of the brain and surrounding structures were obtained without intravenous contrast. COMPARISON:  CT HEAD October 22, 2016 at 1612 hours and MRI of the head October 04, 2016 and August 23rd 2018 and FINDINGS: BRAIN: Multiple new subcentimeter foci of reduced diffusion RIGHT cerebellum with low ADC values. Stable subcentimeter reduced diffusion and splenium of corpus callosum and RIGHT medial parietal lobe without ADC abnormality, possible mild nonenhancing tumor. Increasing fourth ventricle  periventricular FLAIR T2 hyperintensities. Re- demonstration of LEFT cerebellar metastasis. Status post debulking of RIGHT occipital lobe tumor, with surrounding presumable gliosis and, potential residual tumor without mass effect. Susceptibility artifact within and along the margin of the resection cavity. No midline shift. Re-expanded RIGHT occipital horn and RIGHT atrium. No hydrocephalus. Similar patchy to confluent supratentorial white matter FLAIR T2 hyperintensities. LEFT parietal cortical T2 hyperintensity corresponding to known metastasis. No abnormal extra-axial fluid collections. Focal dural thickening subjacent to craniotomy. VASCULAR: Normal major intracranial vascular flow voids present at skull base. SKULL AND UPPER CERVICAL SPINE: Scattered mildly expansile T2 bright foci within RIGHT calvarial diploic space concerning for metastatic disease. Status post RIGHT parieto-occipital craniotomy. Partially empty sella. The Craniocervical junction maintained. SINUSES/ORBITS: Trace mastoid effusions. Mild paranasal sinus mucosal thickening without air-fluid levels. The included ocular globes and orbital contents are non-suspicious. Status post bilateral ocular lens implants. OTHER: Larger RIGHT parietoccipital T2 bright  fluid collection with susceptibility artifact overlying craniotomy, predominately of loosening signal on FLAIR sequence. IMPRESSION: 1. Acute subcentimeter RIGHT cerebellar infarcts, less likely new metastasis. 2. Interval debulking of RIGHT occipital tumor with improved appearance, residual edema and potential tumor ; limited assessment without contrast. No residual mass effect or midline shift. 3. 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 4. Additional intracranial metastasis better characterized on prior MRI. 5. Moderate chronic small vessel ischemic disease. Electronically Signed   By: Elon Alas M.D.   On: 10/15/2016 17:27   Mr Jeri Cos TI  Contrast  Result Date: 10/04/2016 CLINICAL DATA:  76 year old female postoperative day 1 stereotactic subtotal resection of a large right occipital brain tumor. Preliminary pathology suggesting breast cancer metastasis. Intraoperative evidence of tumor extension to the trigone of the lateral ventricle. EXAM: MRI HEAD WITHOUT AND WITH CONTRAST TECHNIQUE: Multiplanar, multiecho pulse sequences of the brain and surrounding structures were obtained without and with intravenous contrast. CONTRAST:  40mL MULTIHANCE GADOBENATE DIMEGLUMINE 529 MG/ML IV SOLN COMPARISON:  Preoperative brain MRI 09/28/2016 and earlier. FINDINGS: Brain: Right posterior paramidline craniotomy site with underlying small extra-axial air in fluid collection up to 11 mm in thickness. Subjacent resection cavity with a combination of fluid and gas. The resection cavity extends to the atrium of the right lateral ventricle just posterior to the choroid plexus. There is pneumoventricle, with gas in both frontal horns, but no ventriculomegaly. Small volume pneumocephalus along the right frontal convexity. A small peninsula of residual brain parenchyma along the superior aspect of the resection cavity demonstrates restricted diffusion (series 3, image 28). There otherwise several small foci of restricted along the right splenium of the corpus callosum diffusion. Postcontrast enhancement at the resection cavity is nodular and curvilinear along the anterior margin and junction with the atrium of the right lateral ventricle, best demonstrated on series 11, image 27, series 12, image 8 and series 13, image 8. Mild surrounding T2 and FLAIR hyperintensity with largely resolved mass effect on the adjacent ventricle. Stable 10 mm enhancing left cerebellar metastasis on series 11, image 9. Continued confluent abnormal enhancement in both internal auditory canals, more so the left (series 11, images 12 and 13). Stable to slightly decreased punctate and curvilinear  foci of enhancement along the left parietal lobe which are favored to be post ischemic rather than metastatic. No other enhancement suspicious for metastatic disease. Scattered nonspecific cerebral white matter T2 and FLAIR hyperintensity appears stable. No restricted diffusion to suggest acute infarction. No midline shift. Basilar cisterns remain patent. Cervicomedullary junction and pituitary are within normal limits. Vascular: Major intracranial vascular flow voids are stable aside from some extrinsic mass effect on the superior sagittal sinus near the craniectomy site. Skull and upper cervical spine: Negative visualized cervical spine. Stable heterogeneous calvarium bone marrow signal. Bone marrow signal at the skullbase and in the visible cervical spine remains normal. Sinuses/Orbits: Stable and negative orbits soft tissues. Mild paranasal sinus mucosal thickening is stable. Stable trace left mastoid effusion. Other: Postoperative changes to the superior and posterior scalp soft tissues including small scalp hematoma. IMPRESSION: 1. Significant debulking of the large right occipital lobe tumor. Small volume of residual suspicious enhancement along the junction of the anterior resection cavity and atrium of the right lateral ventricle. Several small areas of postoperative restricted diffusion as seen on series 3. 2. Stable appearance of presumed metastatic disease in the left cerebellum and both internal auditory canals. No new leptomeningeal disease or brain metastasis identified. 3. Multiple punctate and  small linear foci of cortical enhancement in the left parietal and posterior frontal lobes are still favored to be post ischemic in nature, but will require MRI surveillance. Electronically Signed   By: Genevie Ann M.D.   On: 10/04/2016 12:47   Mr Jeri Cos ZC Contrast  Result Date: 09/28/2016 CLINICAL DATA:  76 year old female with metastatic disease to the brain and in the chest. Subsequent encounter. EXAM: MRI  HEAD WITHOUT AND WITH CONTRAST TECHNIQUE: Multiplanar, multiecho pulse sequences of the brain and surrounding structures were obtained without and with intravenous contrast. CONTRAST:  83mL MULTIHANCE GADOBENATE DIMEGLUMINE 529 MG/ML IV SOLN COMPARISON:  Brain MRI 09/19/2016. FINDINGS: Brain: Large heterogeneously enhancing right occipital lobe mass with extension to the atrium of the right lateral ventricle today encompasses 70 x 48 by 69 mm (AP by transverse by CC) versus 72 x 50 x 70 mm previously at a comparable level. Note that there is stable conspicuous increased enhancement at the ependyma of the posterior right lateral ventricle (series 11, image 96). Regional mass effect including on the posterior right lateral ventricle appears stable. 10 mm central metastasis in the left cerebellar hemisphere is stable in size. No regional mass effect. There are multiple small cortically based areas of enhancement in the left occipital pole which appear to correspond to the small diffusion restricted areas thought to be ischemia on 09/19/2016 (e.g. Series 11, image 53). Of note, Of note, there are also multiple similar small cortically based areas of enhancement in the left parietal lobe, and these are in proximity to the small round cortically based enhancement which is stable on series 11, image 107 and which was thought to be metastatic on 09/19/2016. Evidence of new abnormal internal auditory canal enhancement left greater than right (series 11, images 43 and 44). This is also visible on the coronal and sagittal post-contrast images. No no other intracranial metastatic disease identified. No leptomeningeal enhancement identified elsewhere. There is a new small focus of restricted diffusion in the left superolateral frontal lobe on series 5, image 44) without enhancement). There is also an increased focus of restricted diffusion in the central splenium of the corpus callosum on series 5, image 32 (no enhancement). No  other ischemic related restricted diffusion. Stable intracranial mass effect and scattered nonenhancing nonspecific bilateral cerebral white matter T2 and FLAIR hyperintense foci. No acute intracranial hemorrhage identified. Negative pituitary and cervicomedullary junction. Vascular: Major intracranial vascular flow voids are stable. Skull and upper cervical spine: Indeterminate 15 mm enhancing right posterior calvarium lesion appears stable on series 11, image 100. There are additional smaller right calvarial enhancing lesions (such as on series 11, image 117. Negative visualized cervical spine and spinal cord. Sinuses/Orbits: Stable and negative. Other: Mastoids remain clear.  Negative scalp soft tissues. IMPRESSION: 1. Large 7 cm right occipital lobe enhancing mass with associated mass effect is stable since 09/19/2016. Note associated posterior right lateral ventricle ependymal enhancement suspicious for extension along the ventricle. 2. New abnormal bilateral Internal Auditory Canal enhancement highly suspicious for leptomeningeal metastatic disease, although no other leptomeningeal enhancement is identified. 3. 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 4. Multiple small cortically based areas of enhancement along the posterior left hemisphere appear to be post ischemic. This raises the possibility that the punctate enhancement (presumed third brain metastasis on 09/20/2015) in the left parietal lobe is instead post-ischemic. 5. New small acute lacunar infarct in the posterior left frontal lobe (series 5, image 44). Electronically Signed   By: Herminio Heads.D.  On: 09/28/2016 18:14   Dg Chest Port 1 View  Result Date: 10/09/2016 CLINICAL DATA:  Bilateral lower extremity swelling. EXAM: PORTABLE CHEST 1 VIEW COMPARISON:  09/18/2016. FINDINGS: Mediastinum and hilar structures normal. Cardiomegaly with mild bilateral interstitial prominence and small bilateral pleural effusions findings consistent with  congestive heart failure. Surgical clips right chest. IMPRESSION: Congestive heart failure with bilateral pulmonary interstitial edema and small bilateral pleural effusions similar findings noted on prior study of 09/18/2016. Electronically Signed   By: Marcello Moores  Register   On: 10/06/2016 15:22    Lab Data:  CBC:  Recent Labs Lab 10/01/2016 1548  WBC 14.0*  NEUTROABS 12.7*  HGB 15.5*  HCT 44.3  MCV 90.8  PLT 096   Basic Metabolic Panel:  Recent Labs Lab 10/23/2016 1548 10/24/16 0527 10/25/16 0813 10/26/16 0525  NA 132* 129* 129* 132*  K 4.0 3.8 3.6 3.5  CL 96* 97* 95* 94*  CO2 26 23 23 25   GLUCOSE 127* 132* 129* 170*  BUN 19 14 19  34*  CREATININE 0.67 0.55 0.71 0.92  CALCIUM 8.6* 8.8* 9.0 9.2   GFR: Estimated Creatinine Clearance: 54.1 mL/min (by C-G formula based on SCr of 0.92 mg/dL). Liver Function Tests:  Recent Labs Lab 10/01/2016 1548  AST 25  ALT 67*  ALKPHOS 94  BILITOT 0.6  PROT 5.8*  ALBUMIN 3.2*   No results for input(s): LIPASE, AMYLASE in the last 168 hours. No results for input(s): AMMONIA in the last 168 hours. Coagulation Profile: No results for input(s): INR, PROTIME in the last 168 hours. Cardiac Enzymes: No results for input(s): CKTOTAL, CKMB, CKMBINDEX, TROPONINI in the last 168 hours. BNP (last 3 results) No results for input(s): PROBNP in the last 8760 hours. HbA1C: No results for input(s): HGBA1C in the last 72 hours. CBG:  Recent Labs Lab 10/16/2016 1555  GLUCAP 128*   Lipid Profile: No results for input(s): CHOL, HDL, LDLCALC, TRIG, CHOLHDL, LDLDIRECT in the last 72 hours. Thyroid Function Tests: No results for input(s): TSH, T4TOTAL, FREET4, T3FREE, THYROIDAB in the last 72 hours. Anemia Panel: No results for input(s): VITAMINB12, FOLATE, FERRITIN, TIBC, IRON, RETICCTPCT in the last 72 hours. Urine analysis:    Component Value Date/Time   COLORURINE YELLOW 10/17/2016 1548   APPEARANCEUR HAZY (A) 10/25/2016 1548   LABSPEC  1.010 09/28/2016 1548   PHURINE 7.0 10/17/2016 [place] 10/24/2016 [place] 10/14/2016 [place] 10/02/2016 1548   KETONESUR NEGATIVE 10/10/2016 1548   PROTEINUR NEGATIVE 10/20/2016 1548   NITRITE NEGATIVE 10/19/2016 1548   LEUKOCYTESUR NEGATIVE 10/10/2016 1548     Anthem Frazer M.D. [facility] 11/24/16, 12:50 PM  Pager: [phone] Between 7am to 7pm - call Pager - [phone]  After 7pm go to [email or web address] - password [email or web address]  Call night coverage person covering after 7pm

## 2016-10-28 NOTE — Progress Notes (Signed)
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

## 2016-10-28 NOTE — Plan of Care (Addendum)
Problem: Education: Goal: Knowledge of disease or condition will improve Outcome: Progressing Discussed with patient plan of care for the evening with some teach back displayed.  Discussed with the family members about patient's condition, signs and symptoms and medications to help for comfort with some teach back displayed.  Family told in the beginning of the shift

## 2016-10-28 NOTE — Death Summary Note (Signed)
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 Per family: patient developed worsening leg edema, associated with generalized diffuse swelling for several days. She was alsonoted to have more weakness in her bl legs, unable to ambulate well. She reported right eye blurry vision for few days. MRI showed acute stroke  Diagnoses  Preliminary cause of death: Acute CVA (cerebrovascular accident) ([place]) Secondary Diagnoses (including complications and co-morbidities):  Principal Problem:   Acute embolic stroke [facility]) Active Problems:   Brain tumor ([place])   Asthma   Atrial fibrillation, new onset ([place])   Status post craniotomy   Metastasis to brain [facility])   Peripheral edema   Cerebellar infarct ([place])   Postoperative CSF leak    Brief Hospital Course (including significant findings, care, treatment, and services provided and events leading to death)   Acute embolic stroke [facility]): Presented with worsening bilateral weakness in the legs, the setting of recent craniotomy, paroxysmal atrial fibrillation not on anticoagulation - MRI of the brain showed acute subcentimeter right cerebellar infarcts, less likely new metastasis - history of atrial fibrillation not on anticoagulation due to bleeding risk - carotid Dopplers 8/23 showed bilateral 1-39% ICA stenosis - 2-D echo showed EF of 60-65% with grade 1  diastolic dysfunction - After discussion with neurology and cleared by neurosurgery, patient was started on eliquis on 9/27, but subsequently placed on hold due to meningocele and CSF leak.   Acute metabolic encephalopathy, expanding meningocele - On 9/28, patient had mental status changes, stat CT head showed progressive subgaleal fluid collection around the right occipital bone, presumingly CSF, no hemorrhage or acute intracranial findings. The patient was transferred to stepdown unit. Evaluated by neurosurgery and no indication for surgery at this time and recommended to observe. Patient was continued on IV Decadron and Keppra. - Patient continued to have progressively worsening neurological status, more lethargic.Per neurosurgery, overall poor prognosis. Palliative medicine was consulted. Discussed in detail with patient's daughter, Deanna Washington and patient's son, they are anticipating family from [place] and moving towards full comfort care. Patient was placed on comfort care status and passed on 11/13/16.     Metastasis to brain, status post craniotomy for resection of right occipital brain metastasis on 9/6 , seizures -neurology and neurosurgery were consulted. However due to worsening overall condition, Radiation was placed on hold. - Palliative care was consulted and patient was placed on comfort care.   bilateral lower extremity edema, Acute on chronic diastolic CHF, dyspnea  - Doppler ultrasound of the lower extremities negative for DVT - negative balance of 6 L with lasix, was subsequently transitioned towards full comfort care  Multifocal pneumonia - CT angiogram chest negative for PE however showed slight worsening of mediastinal metastatic retinopathy, innumerable pulmonary nodules consistent with metastatic disease. Multifocal groundglass densityin the bilateral lungs most notable in the right upper lobe could reflect superimposed multifocal pneumonia, was placed on Levaquin.     Asthma - comfort care  Atrial fibrillation, paroxysmal - was placed on amiodarone for rate control - was placed on eliquis due to embolic CVA but then on hold secondary to meningocele   Pertinent Labs and Studies  Significant Diagnostic Studies Ct Head Wo Contrast  Result Date: 10/25/2016 CLINICAL DATA:  Craniotomy for tumor resection. Increased swelling at operative site. EXAM: CT HEAD WITHOUT CONTRAST TECHNIQUE: Contiguous axial images were obtained from the base of the skull through the vertex without intravenous contrast. COMPARISON:  Brain MRI from 3 days ago FINDINGS: Brain: Low-density resection cavity in the right occipital lobe. There is small extra-axial fluid accumulation without significant mass effect. Progressive low-density subgaleal fluid collection around the bone flap. No evidence of acute infarct, acute hemorrhage, hydrocephalus, or shift. Patient has metastatic and ischemic findings by brain MRI, not visible on this scan Vascular: Atherosclerosis.  No hyperdense vessel. Skull: Stable positioning of right occipital parietal craniotomy flap. Sinuses/Orbits: No acute finding.  Bilateral cataract resection IMPRESSION: 1. Progressive subgaleal fluid collection around the right occipital bone flap, presumably CSF. 2. No acute intracranial finding. 3. Ischemic and metastatic findings in the brain by recent MRI that are not detectable or progressive by CT. Electronically Signed   By: Monte Fantasia M.D.   On: 10/25/2016 08:12   Ct Head Wo Contrast  Result Date: 10/07/2016 CLINICAL DATA:  Expressive aphasia, lethargy, difficulty standing, LEFT peripheral vision loss for 2 days. History of metastatic breast cancer on chemo radiation. Status post debulking of RIGHT occipital tumor. EXAM: CT HEAD WITHOUT CONTRAST TECHNIQUE: Contiguous axial images were obtained from the base of the skull through the vertex without intravenous contrast. COMPARISON:  MRI of the head October 04, 2016 and CT  HEAD September 18, 2016 FINDINGS: BRAIN: RIGHT occipital resection cavity, re-expanded RIGHT occipital horn. No significant residual mass effect, surrounding low-density encephalomalacia. Moderate parenchymal brain volume loss for age. Patchy supratentorial white matter hypodensities, some of which reflect metastatic disease characterized on prior MRI. Subcentimeter LEFT temporal lobe metastasis better seen on prior MRI. No acute large vascular territory infarct. No intraparenchymal hemorrhage. No extra-axial fluid collections. Basal cisterns are patent. VASCULAR: Mild to moderate calcific atherosclerosis of the carotid siphons. SKULL: Status post RIGHT parieto-occipital craniotomy. RIGHT calvarial suspected metastasis better characterized on prior MRI. RIGHT parieto-occipital scalp fluid collection measuring 6.2 x 1.4 cm. Borderline partially empty sella. SINUSES/ORBITS: Mild paranasal sinus mucosal thickening without air-fluid levels. Mastoid air cells are well aerated. The included ocular globes and orbital contents are non-suspicious. Status post bilateral ocular lens implants. OTHER: None. IMPRESSION: 1. Interval RIGHT craniotomy for debulking occipital lobe tumor, improved appearance with decreased mass effect and re-expanded RIGHT occipital horn. 2. 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 3. Moderate white matter changes consistent chronic small vessel ischemic disease and known metastasis. Electronically Signed   By: Elon Alas M.D.   On: 10/18/2016 16:47   Ct Angio Chest Pe W Or Wo Contrast  Result Date: 10/23/2016 CLINICAL DATA:  Short of breath, recent surgery EXAM: CT ANGIOGRAPHY CHEST WITH CONTRAST TECHNIQUE: Multidetector CT imaging of the chest was performed using the standard protocol during bolus administration of intravenous contrast. Multiplanar CT image reconstructions and MIPs were obtained to evaluate the vascular anatomy.  CONTRAST:  75 mL Isovue 370 intravenous COMPARISON:  10/14/2016, CT chest 09/19/2016 FINDINGS: Cardiovascular: Satisfactory opacification of the pulmonary arteries to the segmental level. No evidence of pulmonary embolism. Ectatic ascending aorta, measuring up to 3.7 cm. No dissection is seen. Atherosclerotic  calcifications. Enlarged pulmonary trunk, measuring up to 3.9 cm. Coronary artery calcification. Borderline cardiomegaly. No significant pericardial effusion. Mediastinum/Nodes: Re- demonstrated mediastinal adenopathy with increased size of previously noted abnormal lymph nodes. For example AP window soft tissue mass measures 3.9 x 2.3 cm, compared with 3.4 x 2.1 cm previously. Subcarinal mass measures 2.7 cm compared with 2.5 cm previously. Similar appearance of small right hilar nodes. Midline trachea. No thyroid mass. Esophagus unremarkable. Lungs/Pleura: Interim development of multifocal ground-glass densities throughout the lungs, most notable in the right upper lobe. Multiple bilateral pulmonary nodules, also worrisome for metastatic disease with interval increase in size of several previously noted lesions, for example right middle lobe mass measures 2.2 x 1.6 cm, compared with 1.1 x 1.2 cm previously. A right central lung base mass measures 1.8 x 1.3 cm, compared with 1.5 x 1.2 cm previously. Right lower lobe/azygoesophageal mass measures 4.3 x 3 cm compared with 4.2 x 3.1 cm. Interval increase in multiple small pulmonary nodules in the right lower lobe and middle lobes. Increased contiguity of multiple left lower lobe pulmonary nodules. No pleural effusion or pneumothorax. Upper Abdomen: No acute abnormality. Musculoskeletal: Stable small sclerotic foci in T1 and T3. Review of the MIP images confirms the above findings. IMPRESSION: 1. Negative for acute pulmonary embolus or aortic dissection 2. Slight worsening of mediastinal metastatic adenopathy concerning for disease progression 3. Innumerable  pulmonary nodules consistent with metastatic disease, increased number of nodules and size of pre-existing nodules also concerning for progression of disease. Interim development of multifocal ground-glass density within the bilateral lungs most notable in the right upper lobe, could reflect superimposed multifocal pneumonia. Aortic Atherosclerosis (ICD10-I70.0). Electronically Signed   By: Donavan Foil M.D.   On: 10/23/2016 20:27   Mr Brain Wo Contrast (neuro Protocol)  Result Date: 10/16/2016 CLINICAL DATA:  Altered level consciousness. History of metastatic breast cancer. Status post recent bulking of RIGHT occipital lobe metastasis. EXAM: MRI HEAD WITHOUT CONTRAST TECHNIQUE: Multiplanar, multiecho pulse sequences of the brain and surrounding structures were obtained without intravenous contrast. COMPARISON:  CT HEAD October 22, 2016 at 1612 hours and MRI of the head October 04, 2016 and August 23rd 2018 and FINDINGS: BRAIN: Multiple new subcentimeter foci of reduced diffusion RIGHT cerebellum with low ADC values. Stable subcentimeter reduced diffusion and splenium of corpus callosum and RIGHT medial parietal lobe without ADC abnormality, possible mild nonenhancing tumor. Increasing fourth ventricle periventricular FLAIR T2 hyperintensities. Re- demonstration of LEFT cerebellar metastasis. Status post debulking of RIGHT occipital lobe tumor, with surrounding presumable gliosis and, potential residual tumor without mass effect. Susceptibility artifact within and along the margin of the resection cavity. No midline shift. Re-expanded RIGHT occipital horn and RIGHT atrium. No hydrocephalus. Similar patchy to confluent supratentorial white matter FLAIR T2 hyperintensities. LEFT parietal cortical T2 hyperintensity corresponding to known metastasis. No abnormal extra-axial fluid collections. Focal dural thickening subjacent to craniotomy. VASCULAR: Normal major intracranial vascular flow voids present at skull  base. SKULL AND UPPER CERVICAL SPINE: Scattered mildly expansile T2 bright foci within RIGHT calvarial diploic space concerning for metastatic disease. Status post RIGHT parieto-occipital craniotomy. Partially empty sella. The Craniocervical junction maintained. SINUSES/ORBITS: Trace mastoid effusions. Mild paranasal sinus mucosal thickening without air-fluid levels. The included ocular globes and orbital contents are non-suspicious. Status post bilateral ocular lens implants. OTHER: Larger RIGHT parietoccipital T2 bright fluid collection with susceptibility artifact overlying craniotomy, predominately of loosening signal on FLAIR sequence. IMPRESSION: 1. Acute subcentimeter RIGHT cerebellar infarcts, less likely new metastasis. 2. Interval debulking  of RIGHT occipital tumor with improved appearance, residual edema and potential tumor ; limited assessment without contrast. No residual mass effect or midline shift. 3. 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 4. Additional intracranial metastasis better characterized on prior MRI. 5. Moderate chronic small vessel ischemic disease. Electronically Signed   By: Elon Alas M.D.   On: 10/26/2016 17:27   Mr Jeri Cos SV Contrast  Result Date: 10/04/2016 CLINICAL DATA:  76 year old female postoperative day 1 stereotactic subtotal resection of a large right occipital brain tumor. Preliminary pathology suggesting breast cancer metastasis. Intraoperative evidence of tumor extension to the trigone of the lateral ventricle. EXAM: MRI HEAD WITHOUT AND WITH CONTRAST TECHNIQUE: Multiplanar, multiecho pulse sequences of the brain and surrounding structures were obtained without and with intravenous contrast. CONTRAST:  60mL MULTIHANCE GADOBENATE DIMEGLUMINE 529 MG/ML IV SOLN COMPARISON:  Preoperative brain MRI 09/28/2016 and earlier. FINDINGS: Brain: Right posterior paramidline craniotomy site with underlying small extra-axial air in fluid  collection up to 11 mm in thickness. Subjacent resection cavity with a combination of fluid and gas. The resection cavity extends to the atrium of the right lateral ventricle just posterior to the choroid plexus. There is pneumoventricle, with gas in both frontal horns, but no ventriculomegaly. Small volume pneumocephalus along the right frontal convexity. A small peninsula of residual brain parenchyma along the superior aspect of the resection cavity demonstrates restricted diffusion (series 3, image 28). There otherwise several small foci of restricted along the right splenium of the corpus callosum diffusion. Postcontrast enhancement at the resection cavity is nodular and curvilinear along the anterior margin and junction with the atrium of the right lateral ventricle, best demonstrated on series 11, image 27, series 12, image 8 and series 13, image 8. Mild surrounding T2 and FLAIR hyperintensity with largely resolved mass effect on the adjacent ventricle. Stable 10 mm enhancing left cerebellar metastasis on series 11, image 9. Continued confluent abnormal enhancement in both internal auditory canals, more so the left (series 11, images 12 and 13). Stable to slightly decreased punctate and curvilinear foci of enhancement along the left parietal lobe which are favored to be post ischemic rather than metastatic. No other enhancement suspicious for metastatic disease. Scattered nonspecific cerebral white matter T2 and FLAIR hyperintensity appears stable. No restricted diffusion to suggest acute infarction. No midline shift. Basilar cisterns remain patent. Cervicomedullary junction and pituitary are within normal limits. Vascular: Major intracranial vascular flow voids are stable aside from some extrinsic mass effect on the superior sagittal sinus near the craniectomy site. Skull and upper cervical spine: Negative visualized cervical spine. Stable heterogeneous calvarium bone marrow signal. Bone marrow signal at the  skullbase and in the visible cervical spine remains normal. Sinuses/Orbits: Stable and negative orbits soft tissues. Mild paranasal sinus mucosal thickening is stable. Stable trace left mastoid effusion. Other: Postoperative changes to the superior and posterior scalp soft tissues including small scalp hematoma. IMPRESSION: 1. Significant debulking of the large right occipital lobe tumor. Small volume of residual suspicious enhancement along the junction of the anterior resection cavity and atrium of the right lateral ventricle. Several small areas of postoperative restricted diffusion as seen on series 3. 2. Stable appearance of presumed metastatic disease in the left cerebellum and both internal auditory canals. No new leptomeningeal disease or brain metastasis identified. 3. Multiple punctate and small linear foci of cortical enhancement in the left parietal and posterior frontal lobes are still favored to be post ischemic in nature, but will require MRI surveillance.  Electronically Signed   By: Genevie Ann M.D.   On: 10/04/2016 12:47   Mr Jeri Cos TD Contrast  Result Date: 09/28/2016 CLINICAL DATA:  76 year old female with metastatic disease to the brain and in the chest. Subsequent encounter. EXAM: MRI HEAD WITHOUT AND WITH CONTRAST TECHNIQUE: Multiplanar, multiecho pulse sequences of the brain and surrounding structures were obtained without and with intravenous contrast. CONTRAST:  78mL MULTIHANCE GADOBENATE DIMEGLUMINE 529 MG/ML IV SOLN COMPARISON:  Brain MRI 09/19/2016. FINDINGS: Brain: Large heterogeneously enhancing right occipital lobe mass with extension to the atrium of the right lateral ventricle today encompasses 70 x 48 by 69 mm (AP by transverse by CC) versus 72 x 50 x 70 mm previously at a comparable level. Note that there is stable conspicuous increased enhancement at the ependyma of the posterior right lateral ventricle (series 11, image 96). Regional mass effect including on the posterior right  lateral ventricle appears stable. 10 mm central metastasis in the left cerebellar hemisphere is stable in size. No regional mass effect. There are multiple small cortically based areas of enhancement in the left occipital pole which appear to correspond to the small diffusion restricted areas thought to be ischemia on 09/19/2016 (e.g. Series 11, image 53). Of note, Of note, there are also multiple similar small cortically based areas of enhancement in the left parietal lobe, and these are in proximity to the small round cortically based enhancement which is stable on series 11, image 107 and which was thought to be metastatic on 09/19/2016. Evidence of new abnormal internal auditory canal enhancement left greater than right (series 11, images 43 and 44). This is also visible on the coronal and sagittal post-contrast images. No no other intracranial metastatic disease identified. No leptomeningeal enhancement identified elsewhere. There is a new small focus of restricted diffusion in the left superolateral frontal lobe on series 5, image 44) without enhancement). There is also an increased focus of restricted diffusion in the central splenium of the corpus callosum on series 5, image 32 (no enhancement). No other ischemic related restricted diffusion. Stable intracranial mass effect and scattered nonenhancing nonspecific bilateral cerebral white matter T2 and FLAIR hyperintense foci. No acute intracranial hemorrhage identified. Negative pituitary and cervicomedullary junction. Vascular: Major intracranial vascular flow voids are stable. Skull and upper cervical spine: Indeterminate 15 mm enhancing right posterior calvarium lesion appears stable on series 11, image 100. There are additional smaller right calvarial enhancing lesions (such as on series 11, image 117. Negative visualized cervical spine and spinal cord. Sinuses/Orbits: Stable and negative. Other: Mastoids remain clear.  Negative scalp soft tissues.  IMPRESSION: 1. Large 7 cm right occipital lobe enhancing mass with associated mass effect is stable since 09/19/2016. Note associated posterior right lateral ventricle ependymal enhancement suspicious for extension along the ventricle. 2. New abnormal bilateral Internal Auditory Canal enhancement highly suspicious for leptomeningeal metastatic disease, although no other leptomeningeal enhancement is identified. 3. 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 4. Multiple small cortically based areas of enhancement along the posterior left hemisphere appear to be post ischemic. This raises the possibility that the punctate enhancement (presumed third brain metastasis on 09/20/2015) in the left parietal lobe is instead post-ischemic. 5. New small acute lacunar infarct in the posterior left frontal lobe (series 5, image 44). Electronically Signed   By: Genevie Ann M.D.   On: 09/28/2016 18:14   Dg Chest Port 1 View  Result Date: 10/12/2016 CLINICAL DATA:  Bilateral lower extremity swelling. EXAM: PORTABLE CHEST 1 VIEW COMPARISON:  09/18/2016. FINDINGS: Mediastinum and hilar structures normal. Cardiomegaly with mild bilateral interstitial prominence and small bilateral pleural effusions findings consistent with congestive heart failure. Surgical clips right chest. IMPRESSION: Congestive heart failure with bilateral pulmonary interstitial edema and small bilateral pleural effusions similar findings noted on prior study of 09/18/2016. Electronically Signed   By: Marcello Moores  Register   On: 10/10/2016 15:22    Microbiology Recent Results (from the past 240 hour(s))  MRSA PCR Screening     Status: None   Collection Time: 10/25/16  8:16 PM  Result Value Ref Range Status   MRSA by PCR NEGATIVE NEGATIVE Final    Comment:        The GeneXpert MRSA Assay (FDA approved for NASAL specimens only), is one component of a comprehensive MRSA colonization surveillance program. It is not intended to diagnose MRSA infection nor  to guide or monitor treatment for MRSA infections.     Lab Basic Metabolic Panel:  Recent Labs Lab 10/07/2016 1548 10/24/16 0527 10/25/16 0813 10/26/16 0525  NA 132* 129* 129* 132*  K 4.0 3.8 3.6 3.5  CL 96* 97* 95* 94*  CO2 26 23 23 25   GLUCOSE 127* 132* 129* 170*  BUN 19 14 19  34*  CREATININE 0.67 0.55 0.71 0.92  CALCIUM 8.6* 8.8* 9.0 9.2   Liver Function Tests:  Recent Labs Lab 10/24/2016 1548  AST 25  ALT 67*  ALKPHOS 94  BILITOT 0.6  PROT 5.8*  ALBUMIN 3.2*   No results for input(s): LIPASE, AMYLASE in the last 168 hours. No results for input(s): AMMONIA in the last 168 hours. CBC:  Recent Labs Lab 10/18/2016 1548  WBC 14.0*  NEUTROABS 12.7*  HGB 15.5*  HCT 44.3  MCV 90.8  PLT 335   Cardiac Enzymes: No results for input(s): CKTOTAL, CKMB, CKMBINDEX, TROPONINI in the last 168 hours. Sepsis Labs:  Recent Labs Lab 10/18/2016 1548  WBC 14.0*    Procedures/Operations  as above.     Avika Carbine 11-25-2016, 6:42 PM

## 2016-10-28 NOTE — Progress Notes (Signed)
Entered chart to review notes of patient on my calendar to see me today, learned patient deceased.

## 2016-10-28 NOTE — Progress Notes (Signed)
Noted medical decline and comfort care. We will sign off. [phone]

## 2016-10-28 NOTE — Progress Notes (Signed)
Daily Progress Note   Patient Name: Deanna Washington       Date: October 31, 2016 DOB: Sep 30, 1940  Age: 76 y.o. MRN#: [id number] Attending Physician: Mendel Corning, MD Primary Care Physician: Haywood Pao, MD Admit Date: 10/23/2016  Reason for Consultation/Follow-up: Establishing goals of care, Non pain symptom management, Pain control, Psychosocial/spiritual support and Terminal Care  Subjective: Pt minimally responsive. Son Jeanell Sparrow, dtr Debbie at the bedside. Family waiting for pt's son to arrive from [place] ( leaving this am); other dtr from [place] arriving this am.  Chart reviewed. Pt has utilized MS04 6mg  alternating with ativan 1.5 mg overnight . Moaning with exhalation but no grimacing. Upper airway secretions starting  Spoke to [place] who shared that her father does not know how sick pt is. Reports they have been telling him she is declining but have not actually told him she is dying.   Length of Stay: 5  Current Medications: Scheduled Meds:  . acetaZOLAMIDE  250 mg Oral BID  . amiodarone  100 mg Oral BID  . atorvastatin  40 mg Oral Daily  . dexamethasone  4 mg Intravenous Q6H  . furosemide  40 mg Intravenous Daily  . glycopyrrolate  0.2 mg Intravenous Q6H  . mouth rinse  15 mL Mouth Rinse BID  . mometasone-formoterol  2 puff Inhalation BID  . pantoprazole (PROTONIX) IV  40 mg Intravenous Q24H    Continuous Infusions: . levETIRAcetam Stopped (10/26/16 2125)  . levofloxacin (LEVAQUIN) IV Stopped (10/26/16 1047)    PRN Meds: acetaminophen **OR** acetaminophen (TYLENOL) oral liquid 160 mg/5 mL **OR** acetaminophen, albuterol, glycopyrrolate, hydrALAZINE, LORazepam, morphine injection, ondansetron (ZOFRAN) IV  Physical Exam  Constitutional:  Acutely ill appearing female; transitioning  towards EOL  HENT:  Soft tissue swelling at surgical site  Cardiovascular:  irrg  Pulmonary/Chest:  Increased work of breathing Upper airway secretions starting Moaning with exhalation  Genitourinary:  Genitourinary Comments: Foley   Neurological:  lethargic  Skin:  Cool, mottled  Psychiatric:  Lethargic; unable to test  Nursing note and vitals reviewed.           Vital Signs: BP (!) 156/97   Pulse 88   Temp 97.6 F (36.4 C) (Oral)   Resp (!) 31   Ht 5\' 8"  (1.727 m)   Wt 68.8 kg (151 lb 10.8 oz)  SpO2 96%   BMI 23.06 kg/m  SpO2: SpO2: 96 % O2 Device: O2 Device: Nasal Cannula O2 Flow Rate: O2 Flow Rate (L/min): 2 L/min  Intake/output summary:  Intake/Output Summary (Last 24 hours) at 2016-11-01 0805 Last data filed at Nov 01, 2016 0600  Gross per 24 hour  Intake              255 ml  Output              100 ml  Net              155 ml   LBM: Last BM Date: 10/23/16 Baseline Weight: Weight: 72.8 kg (160 lb 8 oz) Most recent weight: Weight: 68.8 kg (151 lb 10.8 oz)       Palliative Assessment/Data:    Flowsheet Rows     Most Recent Value  Intake Tab  Referral Department  Hospitalist  Unit at Time of Referral  Intermediate Care Unit  Palliative Care Primary Diagnosis  Cancer  Date Notified  10/25/16  Palliative Care Type  New Palliative care  Reason for referral  Pain, Non-pain Symptom, Clarify Goals of Care, Counsel Regarding Hospice, Psychosocial or Spiritual support  Date of Admission  10/15/2016  Date first seen by Palliative Care  10/26/16  # of days Palliative referral response time  1 Day(s)  # of days IP prior to Palliative referral  3  Clinical Assessment  Palliative Performance Scale Score  20%  Pain Max last 24 hours  Not able to report  Pain Min Last 24 hours  Not able to report  Dyspnea Max Last 24 Hours  Not able to report  Dyspnea Min Last 24 hours  Not able to report  Nausea Max Last 24 Hours  Not able to report  Nausea Min Last 24 Hours  Not  able to report  Anxiety Max Last 24 Hours  Not able to report  Anxiety Min Last 24 Hours  Not able to report  Other Max Last 24 Hours  Not able to report  Psychosocial & Spiritual Assessment  Palliative Care Outcomes  Patient/Family meeting held?  Yes  Who was at the meeting?  daughter Kathrine Cords  Palliative Care Outcomes  Improved pain interventions, Improved non-pain symptom therapy, Clarified goals of care, Counseled regarding hospice, Provided psychosocial or spiritual support  Patient/Family wishes: Interventions discontinued/not started   Mechanical Ventilation, Trach, PEG, Hemodialysis  Palliative Care follow-up planned  Yes, Facility      Patient Active Problem List   Diagnosis Date Noted  . Palliative care by specialist   . Postoperative CSF leak   . 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 Peripheral edema   . Status post craniotomy 10/03/2016  . 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 Brain tumor ([place]) 09/19/2016  . Asthma 09/19/2016  . Essential hypertension 09/19/2016  . Acute embolic stroke ([place]) 58/09/9831  . Atrial fibrillation, new onset ([place]) 09/19/2016  . Sinus bradycardia 09/19/2016  . Metastatic cancer ([place]) 09/19/2016  . Bradycardia   . Tachy-brady syndrome [facility])     Palliative Care Assessment & Plan   Patient Profile: 76 y.o. female  with past medical history of Hypertension, atrial fibrillation, breast cancer (diagnosed in 2010) admitted on 10/08/2016 with increased lower extremity edema as well as weakness. Patient was found to have return of disease and underwent brain resection right occipital lobe on 10/03/2016. Plan was to continue with treatment and to go for brain radiation to begin on 10/28/2016. Upon admission, patient  was found to have had a CVA (questionable hypercoagulable state secondary to cancer versus postsurgical complication). Repeat CT scan shows progressive CF leakage; and innumerable bilateral pulmonary nodules  Patient is becoming more  lethargic, complaining of nausea, and has developed mottling. Consult ordered for goals of care, hospice referral.  Pt more lethargic overnight with increased work of breathing.   Recommendations/Plan: Continue with current plan of care until all of family arrives then anticipate family moving towards full comfort care  Pain/dyspnea:Waiting for family to arrive. Continue with MS04 and ativan. Will add scheduled MS04 2 mg q6 ATC and continue PRN.   Secretions: Start sch [place]ul. DC atropine  Anxiety: Continue with PRN ativan and monitor for need for sch dosing  Goals of Care and Additional Recommendations:  Limitations on Scope of Treatment: Minimize Medications, Initiate Comfort Feeding, No Artificial Feeding, No Chemotherapy, No Diagnostics, No Hemodialysis, No Radiation, No Surgical Procedures and No Tracheostomy  Code Status:    Code Status Orders        Start     Ordered   10/17/2016 2317  Do not attempt resuscitation (DNR)  Continuous    Question Answer Comment  In the event of cardiac or respiratory ARREST Do not call a "code blue"   In the event of cardiac or respiratory ARREST Do not perform Intubation, CPR, defibrillation or ACLS   In the event of cardiac or respiratory ARREST Use medication by any route, position, wound care, and other measures to relive pain and suffering. May use oxygen, suction and manual treatment of airway obstruction as needed for comfort.      10/06/2016 2316    Code Status History    Date Active Date Inactive Code Status Order ID Comments User Context   10/03/2016  8:19 PM 10/07/2016  3:37 PM Full Code [id number]  Kary Kos, MD Inpatient   09/19/2016 12:57 AM 09/24/2016  3:28 PM Full Code [id number]  Gennaro Africa, MD ED    Advance Directive Documentation     Most Recent Value  Type of Advance Directive  Healthcare Power of Attorney  Pre-existing out of facility DNR order (yellow form or pink MOST form)  -  "MOST" Form in Place?  -        Prognosis:   < 2 weeks barring an acute event, in the setting of metastatic breast cancer with disease spread to the brain, postop resection, bilateral pulmonary nodules, increasing lethargy symptomatic with complaints of pain and nausea    Discharge Planning:  Hospital death vs transfer to residential hospice. Pt is reaching the point however where she maybe too unstable for transport  Care plan was discussed with Dr. Tana Coast  Thank you for allowing the Palliative Medicine Team to assist in the care of this patient.   Time In: 0800 Time Out: 0910 Total Time 70 min Prolonged Time Billed  no       Greater than 50%  of this time was spent counseling and coordinating care related to the above assessment and plan.  Dory Horn, NP  Please contact Palliative Medicine Team phone at [phone] for questions and concerns.

## 2016-10-28 NOTE — Progress Notes (Signed)
[place], NP paged; family is ready to discuss morphine gtt. Pt sats currently in 30s

## 2016-10-28 DEATH — deceased

## 2016-10-29 ENCOUNTER — Ambulatory Visit: Payer: Medicare Other

## 2016-10-30 ENCOUNTER — Ambulatory Visit: Payer: Medicare Other

## 2016-10-31 ENCOUNTER — Ambulatory Visit: Payer: Medicare Other

## 2016-11-01 ENCOUNTER — Ambulatory Visit: Payer: Medicare Other

## 2016-11-04 ENCOUNTER — Ambulatory Visit: Payer: Medicare Other

## 2016-11-04 ENCOUNTER — Ambulatory Visit: Payer: Medicare Other | Admitting: Radiation Oncology

## 2016-11-05 ENCOUNTER — Ambulatory Visit: Payer: Medicare Other

## 2016-11-06 ENCOUNTER — Other Ambulatory Visit: Payer: Self-pay | Admitting: Oncology

## 2016-11-06 ENCOUNTER — Ambulatory Visit: Payer: Medicare Other

## 2016-11-06 NOTE — Progress Notes (Signed)
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  She says she underwent surgery and radiation with no systemic treatment.  She was admitted recently to the hospital for headahces, nausea/vomiting, and was discovered to have a large right occipital mass as well as two smaller satellite lesions in the left hemisphere systemic workup revealed adenopathy above and below the diaphragm.  She underwent craniotomy on 10/03/2016 that revealed: metastatic breast cancer, HER-2 positive (ratio 2.76).    The patient's subsequent history is as detailed below.  INTERVAL HISTORY: Deanna Washington is here today for follow up after having her craniotomy that revealed HER-2 positive breast cancer.  She is doing well.  She is accompanied today by her daughter [name].  She is in a wheelchair.  She says that she id doing moderately well since discharge from the hospital.  She does have some continued lower extremity edema that she is wearing AE hose for, but she is also tired.    REVIEW OF SYSTEMS:  Deanna Washington continues to work on rebuilding her strength.  She is owrking with PT and OT, and she does need assistance with a lot of the household chores.  She does enjoy folding luandry, and is working on being able to bathe again.  She is independent in toileting.  She does have help when she needs it.  We reviewed her meds today,  and she is doing well with no other concerns.    PAST MEDICAL HISTORY: Past Medical History:  Diagnosis Date  . Asthma   . 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 Breast cancer [facility])    believes it was in 2010  . HTN (hypertension)   . 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 BREAST LUMPECTOMY     lumpectomy  . CRANIOTOMY Right 10/03/2016   Procedure: Right Occipital CRANIOTOMY TUMOR EXCISION with Curve;  Surgeon: Kary Kos, MD;  Location: [place];  Service: Neurosurgery;  Laterality: Right;    FAMILY HISTORY Family History  Problem Relation Age of Onset  . Lung cancer Sister   . Esophageal cancer Brother   . Leukemia Maternal Aunt   . Lung cancer Brother   . Leukemia Brother   . Lung cancer Sister   . Breast cancer Neg Hx   . Colon cancer Neg Hx   . Pancreatic cancer Neg Hx     GYNECOLOGIC HISTORY:  No LMP recorded. Patient is postmenopausal. Menarche at age 40.  Took estrogen replacement therapy, took for three years.  Stopped menses in late 40s.  No h/o abnormal pap smears, post menopausal bleeding.     SOCIAL HISTORY:  Lives at home with husband and son Legrand Como.  Husband has metastatic prostate cancer, just went into home hospice.  Patient was his previous caregiver, son, Legrand Como is now caregiver, and daughter [name] also helps. Other children Butch Penny  lives in [place], Trimble lives in [place], and [place] lives in [place].      ADVANCED DIRECTIVES:  Daughters [name] [phone]) and Butch Penny [phone]) are health care powers of attorney.  No living will or advance directives in place at this time.  Had lengthy discussion with patient and daughter, she does not want resuscitation in the event of cardiac arrest.    HEALTH MAINTENANCE: Social History  Substance Use Topics  . Smoking status: Former Research scientist (life sciences)  . Smokeless tobacco: Never Used     Comment: quit 25 years ago  .  Alcohol use 1.2 oz/week    2 Glasses of wine per week     Comment: 2 glasses a day     Colonoscopy:  PAP:  Bone density:   No Known Allergies  Current Outpatient Prescriptions  Medication Sig Dispense Refill  . ADVAIR DISKUS 500-50 MCG/DOSE AEPB Inhale 1 puff into the lungs 2 (two) times daily. 60 each 6  . amiodarone (PACERONE) 200 MG tablet Take 0.5 tablets (100 mg total) by mouth 2 (two) times daily. 30 tablet 5  . atorvastatin (LIPITOR) 20 MG tablet Take 1 tablet (20 mg total) by mouth daily. (Patient not taking: Reported on 10/16/2016) 30 tablet 0  . dexamethasone (DECADRON) 4 MG tablet Take 2 tablets (8 mg total) by mouth 2 (two) times daily with a meal. (Patient taking differently: Take 4-8 mg by mouth 3 (three) times daily. Take 25m by mouth in the morning, 449min the afternoon, and 17m51mt bedtime) 120 tablet 0  . diphenhydrAMINE (BENADRYL) 25 mg capsule Take 25 mg by mouth at bedtime as needed for sleep.     . furosemide (LASIX) 20 MG tablet Take 1 tablet (20 mg total) by mouth daily. 30 tablet 0  . HYDROcodone-acetaminophen (NORCO/VICODIN) 5-325 MG tablet Take 1 tablet by mouth every 4 (four) hours as needed for moderate pain. 30 tablet 0  . lapatinib (TYKERB) 250 MG tablet Take 3 tablets (750 mg total) by mouth daily. Take on an empty stomach, 1 hour before or 2 hours after a meal 90 tablet 0  . levETIRAcetam (KEPPRA) 500 MG tablet Take 1 tablet (500 mg total) by mouth 2 (two) times daily. 60 tablet 1  . LORazepam (ATIVAN) 0.5 MG tablet 1 tablet po 30 minutes prior to radiation or MRI 30 tablet 0  . nystatin (MYCOSTATIN) 100000 UNIT/ML suspension Take 5 mLs (500,000 Units total) by mouth 4 (four) times daily. 473 mL 0  . ondansetron (ZOFRAN) 4 MG tablet Take 1 tablet (4 mg total) by mouth every 4 (four) hours as needed for nausea or vomiting. 20 tablet 0  . pantoprazole (PROTONIX) 40 MG tablet Take 1 tablet (40 mg total) by mouth daily. 30 tablet 0  . PROAIR HFA 108 (90 Base)  MCG/ACT inhaler Place 1-2 puffs into alternate nostrils every 4 (four) hours as needed for wheezing.     No current facility-administered medications for this visit.     OBJECTIVE:  There were no vitals filed for this visit.   There is no height or weight on file to calculate BMI.   Wt Readings from Last 3 Encounters:  09/01-Oct-20181 lb 10.8 oz (68.8 kg)  10/12/2016 171 lb (77.6 kg)  10/03/16 171 lb (77.6 kg)      ECOG FS:2 - Symptomatic, <50% confined to bed  GENERAL: Patient is a chronically ill appearing older female in no apparent distress HEENT:  Sclerae anicteric. PERRL. Oropharynx clear and moist. No  ulcerations or evidence of oropharyngeal candidiasis. Neck is supple.  NODES:  No cervical, supraclavicular, or axillary lymphadenopathy palpated.  BREAST EXAM:  Deferred. LUNGS:  Clear to auscultation bilaterally.  No wheezes or rhonchi. HEART:  Regular rate and rhythm. No murmur appreciated. ABDOMEN:  Soft, nontender.  Positive, normoactive bowel sounds. No organomegaly palpated. MSK:  No focal spinal tenderness to palpation. Full range of motion bilaterally in the upper extremities. EXTREMITIES:  BLE edema present, AE hose on  SKIN:  Clear with no obvious rashes or skin changes. No nail dyscrasia. NEURO:  Nonfocal. Well oriented.  Appropriate affect.    LAB RESULTS:  CMP     Lab Results  Component Value Date   WBC 14.0 (H) 10/05/2016   NEUTROABS 12.7 (H) 10/17/2016   HGB 15.5 (H) 10/21/2016   HCT 44.3 10/21/2016   MCV 90.8 09/28/2016   PLT 335 10/13/2016      Chemistry      Component Value Date/Time   NA 132 (L) 10/26/2016 0525   NA 135 (L) 10/16/2016 0814   K 3.5 10/26/2016 0525   K 3.9 10/16/2016 0814   CL 94 (L) 10/26/2016 0525   CO2 25 10/26/2016 0525   CO2 22 10/16/2016 0814   BUN 34 (H) 10/26/2016 0525   BUN 18.8 10/16/2016 0814   CREATININE 0.92 10/26/2016 0525   CREATININE 0.7 10/16/2016 0814      Component Value Date/Time   CALCIUM 9.2  10/26/2016 0525   CALCIUM 8.6 10/16/2016 0814   ALKPHOS 94 10/04/2016 1548   ALKPHOS 104 10/16/2016 0814   AST 25 10/25/2016 1548   AST 12 10/16/2016 0814   ALT 67 (H) 10/13/2016 1548   ALT 43 10/16/2016 0814   BILITOT 0.6 09/28/2016 1548   BILITOT 0.50 10/16/2016 0814       Lab Results  Component Value Date   CA2729 174.7 (H) 09/20/2016    No components found for: HGQUANT  Lab Results  Component Value Date   CEA1 3.5 09/20/2016        (this displays the last labs from the last 3 days)  Urinalysis    Component Value Date/Time   COLORURINE YELLOW 10/23/2016 1548   APPEARANCEUR HAZY (A) 10/11/2016 1548   LABSPEC 1.010 09/28/2016 1548   PHURINE 7.0 10/04/2016 1548   GLUCOSEU NEGATIVE 10/07/2016 1548   [place] 10/09/2016 [place] 09/30/2016 [place] 10/09/2016 1548   PROTEINUR NEGATIVE 10/24/2016 1548   NITRITE NEGATIVE 10/02/2016 1548   LEUKOCYTESUR NEGATIVE 10/10/2016 1548     STUDIES: Ct Head Wo Contrast  Result Date: 10/25/2016 CLINICAL DATA:  Craniotomy for tumor resection. Increased swelling at operative site. EXAM: CT HEAD WITHOUT CONTRAST TECHNIQUE: Contiguous axial images were obtained from the base of the skull through the vertex without intravenous contrast. COMPARISON:  Brain MRI from 3 days ago FINDINGS: Brain: Low-density resection cavity in the right occipital lobe. There is small extra-axial fluid accumulation without significant mass effect. Progressive low-density subgaleal fluid collection around the bone flap. No evidence of acute infarct, acute hemorrhage, hydrocephalus, or shift. Patient has metastatic and ischemic findings by brain MRI, not visible on this scan Vascular: Atherosclerosis.  No hyperdense vessel. Skull: Stable positioning of right occipital parietal craniotomy flap. Sinuses/Orbits: No acute finding.  Bilateral cataract resection IMPRESSION: 1. Progressive subgaleal fluid collection around the  right occipital bone flap, presumably CSF. 2. No acute intracranial finding. 3. Ischemic and metastatic findings in the brain by recent MRI  that are not detectable or progressive by CT. Electronically Signed   By: Monte Fantasia M.D.   On: 10/25/2016 08:12   Ct Head Wo Contrast  Result Date: 10/19/2016 CLINICAL DATA:  Expressive aphasia, lethargy, difficulty standing, LEFT peripheral vision loss for 2 days. History of metastatic breast cancer on chemo radiation. Status post debulking of RIGHT occipital tumor. EXAM: CT HEAD WITHOUT CONTRAST TECHNIQUE: Contiguous axial images were obtained from the base of the skull through the vertex without intravenous contrast. COMPARISON:  MRI of the head October 04, 2016 and CT HEAD September 18, 2016 FINDINGS: BRAIN: RIGHT occipital resection cavity, re-expanded RIGHT occipital horn. No significant residual mass effect, surrounding low-density encephalomalacia. Moderate parenchymal brain volume loss for age. Patchy supratentorial white matter hypodensities, some of which reflect metastatic disease characterized on prior MRI. Subcentimeter LEFT temporal lobe metastasis better seen on prior MRI. No acute large vascular territory infarct. No intraparenchymal hemorrhage. No extra-axial fluid collections. Basal cisterns are patent. VASCULAR: Mild to moderate calcific atherosclerosis of the carotid siphons. SKULL: Status post RIGHT parieto-occipital craniotomy. RIGHT calvarial suspected metastasis better characterized on prior MRI. RIGHT parieto-occipital scalp fluid collection measuring 6.2 x 1.4 cm. Borderline partially empty sella. SINUSES/ORBITS: Mild paranasal sinus mucosal thickening without air-fluid levels. Mastoid air cells are well aerated. The included ocular globes and orbital contents are non-suspicious. Status post bilateral ocular lens implants. OTHER: None. IMPRESSION: 1. Interval RIGHT craniotomy for debulking occipital lobe tumor, improved appearance with  decreased mass effect and re-expanded RIGHT occipital horn. 2. 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 3. Moderate white matter changes consistent chronic small vessel ischemic disease and known metastasis. Electronically Signed   By: Elon Alas M.D.   On: 10/08/2016 16:47   Ct Angio Chest Pe W Or Wo Contrast  Result Date: 10/23/2016 CLINICAL DATA:  Short of breath, recent surgery EXAM: CT ANGIOGRAPHY CHEST WITH CONTRAST TECHNIQUE: Multidetector CT imaging of the chest was performed using the standard protocol during bolus administration of intravenous contrast. Multiplanar CT image reconstructions and MIPs were obtained to evaluate the vascular anatomy. CONTRAST:  75 mL Isovue 370 intravenous COMPARISON:  10/16/2016, CT chest 09/19/2016 FINDINGS: Cardiovascular: Satisfactory opacification of the pulmonary arteries to the segmental level. No evidence of pulmonary embolism. Ectatic ascending aorta, measuring up to 3.7 cm. No dissection is seen. Atherosclerotic calcifications. Enlarged pulmonary trunk, measuring up to 3.9 cm. Coronary artery calcification. Borderline cardiomegaly. No significant pericardial effusion. Mediastinum/Nodes: Re- demonstrated mediastinal adenopathy with increased size of previously noted abnormal lymph nodes. For example AP window soft tissue mass measures 3.9 x 2.3 cm, compared with 3.4 x 2.1 cm previously. Subcarinal mass measures 2.7 cm compared with 2.5 cm previously. Similar appearance of small right hilar nodes. Midline trachea. No thyroid mass. Esophagus unremarkable. Lungs/Pleura: Interim development of multifocal ground-glass densities throughout the lungs, most notable in the right upper lobe. Multiple bilateral pulmonary nodules, also worrisome for metastatic disease with interval increase in size of several previously noted lesions, for example right middle lobe mass measures 2.2 x 1.6 cm, compared with 1.1  x 1.2 cm previously. A right central lung base mass measures 1.8 x 1.3 cm, compared with 1.5 x 1.2 cm previously. Right lower lobe/azygoesophageal mass measures 4.3 x 3 cm compared with 4.2 x 3.1 cm. Interval increase in multiple small pulmonary nodules in the right lower lobe and middle lobes. Increased contiguity of multiple left lower lobe pulmonary nodules. No pleural effusion or pneumothorax. Upper Abdomen: No  acute abnormality. Musculoskeletal: Stable small sclerotic foci in T1 and T3. Review of the MIP images confirms the above findings. IMPRESSION: 1. Negative for acute pulmonary embolus or aortic dissection 2. Slight worsening of mediastinal metastatic adenopathy concerning for disease progression 3. Innumerable pulmonary nodules consistent with metastatic disease, increased number of nodules and size of pre-existing nodules also concerning for progression of disease. Interim development of multifocal ground-glass density within the bilateral lungs most notable in the right upper lobe, could reflect superimposed multifocal pneumonia. Aortic Atherosclerosis (ICD10-I70.0). Electronically Signed   By: Donavan Foil M.D.   On: 10/23/2016 20:27   Mr Brain Wo Contrast (neuro Protocol)  Result Date: 10/04/2016 CLINICAL DATA:  Altered level consciousness. History of metastatic breast cancer. Status post recent bulking of RIGHT occipital lobe metastasis. EXAM: MRI HEAD WITHOUT CONTRAST TECHNIQUE: Multiplanar, multiecho pulse sequences of the brain and surrounding structures were obtained without intravenous contrast. COMPARISON:  CT HEAD October 22, 2016 at 1612 hours and MRI of the head October 04, 2016 and August 23rd 2018 and FINDINGS: BRAIN: Multiple new subcentimeter foci of reduced diffusion RIGHT cerebellum with low ADC values. Stable subcentimeter reduced diffusion and splenium of corpus callosum and RIGHT medial parietal lobe without ADC abnormality, possible mild nonenhancing tumor. Increasing  fourth ventricle periventricular FLAIR T2 hyperintensities. Re- demonstration of LEFT cerebellar metastasis. Status post debulking of RIGHT occipital lobe tumor, with surrounding presumable gliosis and, potential residual tumor without mass effect. Susceptibility artifact within and along the margin of the resection cavity. No midline shift. Re-expanded RIGHT occipital horn and RIGHT atrium. No hydrocephalus. Similar patchy to confluent supratentorial white matter FLAIR T2 hyperintensities. LEFT parietal cortical T2 hyperintensity corresponding to known metastasis. No abnormal extra-axial fluid collections. Focal dural thickening subjacent to craniotomy. VASCULAR: Normal major intracranial vascular flow voids present at skull base. SKULL AND UPPER CERVICAL SPINE: Scattered mildly expansile T2 bright foci within RIGHT calvarial diploic space concerning for metastatic disease. Status post RIGHT parieto-occipital craniotomy. Partially empty sella. The Craniocervical junction maintained. SINUSES/ORBITS: Trace mastoid effusions. Mild paranasal sinus mucosal thickening without air-fluid levels. The included ocular globes and orbital contents are non-suspicious. Status post bilateral ocular lens implants. OTHER: Larger RIGHT parietoccipital T2 bright fluid collection with susceptibility artifact overlying craniotomy, predominately of loosening signal on FLAIR sequence. IMPRESSION: 1. Acute subcentimeter RIGHT cerebellar infarcts, less likely new metastasis. 2. Interval debulking of RIGHT occipital tumor with improved appearance, residual edema and potential tumor ; limited assessment without contrast. No residual mass effect or midline shift. 3. 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 4. Additional intracranial metastasis better characterized on prior MRI. 5. Moderate chronic small vessel ischemic disease. Electronically Signed   By: Elon Alas M.D.   On: 10/19/2016 17:27   Dg  Chest Port 1 View  Result Date: 10/06/2016 CLINICAL DATA:  Bilateral lower extremity swelling. EXAM: PORTABLE CHEST 1 VIEW COMPARISON:  09/18/2016. FINDINGS: Mediastinum and hilar structures normal. Cardiomegaly with mild bilateral interstitial prominence and small bilateral pleural effusions findings consistent with congestive heart failure. Surgical clips right chest. IMPRESSION: Congestive heart failure with bilateral pulmonary interstitial edema and small bilateral pleural effusions similar findings noted on prior study of 09/18/2016. Electronically Signed   By: Marcello Moores  Register   On: 10/14/2016 15:22      ASSESSMENT: 76 y.o.  [facility] woman presenting with headaches, dizziness, and vomiting 09/18/2016, initial noncontrasted CT showing a large right parieto-occipital hypodensity, status post inguinal node biopsy 09/20/2016 with results pending  (1) status post  right lumpectomy 2010 in [place], followed by radiation; no chemotherapy or anti-estrogens  METASTATIC DISEASE: August 2018 (2) brain MRI 09/19/2016 shows 3 brain masses the largest measuring 6.4 cm, involving right cerebrum, cerebellum, and left parietal cortex; CT of the chest abdomen and pelvis 09/19/2016 shows multiple bilateral pulmonary nodules, small bilateral effusions, mediastinal,subcarinal and aortopulmonary window adenopathy, no liver or bone involvement (a) right inguinal lymph node core biopsy 09/20/2016, results pending             (b) CA 27-29 is informative (this means we are dealing with breast cancer)  (c) CT chest/abd/pelvis 09/19/2016 demonstrated pulmonary metastatic disease and fairly extensive necrotic  appearing mediastinal and hilar lymphadenopathy.    (3) Radiation with Dr. Lisbeth Renshaw is pending  (4) Lapatinib 74m daily started on 10/16/2016  (5) Abraxane and Trastuzumab following radiation  PLAN: NLinus Washington and I spent a great deal of time talking about her cancer,  and the plan as Dr. MJana Hakimand I reviewed it.  He also came in and spoke with the patient and DJackelyn Polingas well.  Her plan is to undergo radiation with Dr. MLisbeth Renshawand start on Lapatinib.  I [facility]atinib 2558mtablets, [facility] I called our oral oncology pharmacist JeJohny Drillings well and informed her about the patient. NaFredrickill also undergo treatment with Abraxane and Trastuzumab once she has completed her radiation.  She will start this in late October.  I went ahead and put in the request for her to go to chemo class, and recommended that she bring one of her children to it as well.  She does not want a port initially, so we will not place it with the understanding that she will need to receive her treatment peripherally and we may have to place the port in the future if we have IV access issues.  She did have an echocardiogram done on 09/20/2016 and it demonstrated a LVEF of 60-65%.  I reviewed that she will need to follow up with our cardio-oncology clinic every 3 months for echocardiograms and evaluation for monitoring of heart.  I reviewed that Trastuzumab can weaken her heart, so we have to keep a watchful eye over it and our cardio-oncology team is excellent to do this.  I gave her detailed information in her AVS about Abraxane, Lapatinib, and Trastuzumab.  In her CT scans there were some upper thoracic sclerotic lesions that were noted, and I ordered a bone scan to better evaluate.  The patient and her daughter verbalize understanding with this plan.    Dispo:  -Follow up October 12 with Dr. MaJana Hakimor lab and appointment   NaAccaliaas a good understanding of the overall plan. She agrees with it. She knows the goal of treatment in her case is cure. She will call with any problems that may develop before her next visit here.   MAChauncey CruelMD   11/06/2016 9:07 AM Medical Oncology and Hematology [facility]roni[place] [id number]. [phone]  Fax. [phone] ADDENDUM: I discussed the pathology results with Deanna Washington her daughter. It is very favorable that her tumor is HER-2 positive. This means we do not have to lean completely on chemotherapy, but can use anti-HER-2 treatment.  In addition to Herceptin she will benefit from lapatinib. We can actually start that now, at 750 mg daily. She was cautioned regarding the  possible toxicities, side effects and complications of this agent and she will let us know if particularly diarrhea occurs.  Otherwise as soon as she is done with her radiation treatments she will see [place] again and we will get going on chemotherapy/anti-HER-2 therapy.   I personally saw this patient and performed a substantive portion of this encounter with the listed APP documented above.   Chauncey Cruel, MD Medical Oncology and Hematology [facility] [address] [place], [place] [address] Tel. [phone]    Fax. [phone]

## 2016-11-07 ENCOUNTER — Ambulatory Visit: Payer: Medicare Other

## 2016-11-07 ENCOUNTER — Other Ambulatory Visit: Payer: Self-pay | Admitting: Oncology

## 2016-11-07 NOTE — Progress Notes (Signed)
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  She says she underwent surgery and radiation with no systemic treatment.  She was admitted recently to the hospital for headahces, nausea/vomiting, and was discovered to have a large right occipital mass as well as two smaller satellite lesions in the left hemisphere systemic workup revealed adenopathy above and below the diaphragm.  She underwent craniotomy on 10/03/2016 that revealed: metastatic breast cancer, HER-2 positive (ratio 2.76).    The patient's subsequent history is as detailed below.  INTERVAL HISTORY: Deanna Washington is here today for follow up after having her craniotomy that revealed HER-2 positive breast cancer.  She is doing well.  She is accompanied today by her daughter Deanna Washington.  She is in a wheelchair.  She says that she id doing moderately well since discharge from the hospital.  She does have some continued lower extremity edema that she is wearing AE hose for, but she is also tired.    REVIEW OF SYSTEMS:  [name] continues to work on rebuilding her strength.  She is owrking with PT and OT, and she does need assistance with a lot of the household chores.  She does enjoy folding luandry, and is working on being able to bathe again.  She is independent in toileting.  She does have help when she needs it.  We reviewed her meds today,  and she is doing well with no other concerns.    PAST MEDICAL HISTORY: Past Medical History:  Diagnosis Date  . Asthma   . 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 Breast cancer [facility])    believes it was in 2010  . HTN (hypertension)   . 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 BREAST LUMPECTOMY     lumpectomy  . CRANIOTOMY Right 10/03/2016   Procedure: Right Occipital CRANIOTOMY TUMOR EXCISION with Curve;  Surgeon: Kary Kos, MD;  Location: [place];  Service: Neurosurgery;  Laterality: Right;    FAMILY HISTORY Family History  Problem Relation Age of Onset  . Lung cancer Sister   . Esophageal cancer Brother   . Leukemia Maternal Aunt   . Lung cancer Brother   . Leukemia Brother   . Lung cancer Sister   . Breast cancer Neg Hx   . Colon cancer Neg Hx   . Pancreatic cancer Neg Hx     GYNECOLOGIC HISTORY:  No LMP recorded. Patient is postmenopausal. Menarche at age 61.  Took estrogen replacement therapy, took for three years.  Stopped menses in late 40s.  No h/o abnormal pap smears, post menopausal bleeding.     SOCIAL HISTORY:  Lives at home with husband and son Deanna Washington.  Husband has metastatic prostate cancer, just went into home hospice.  Patient was his previous caregiver, son, Deanna Washington is now caregiver, and daughter Deanna Washington also helps. Other children Deanna Washington  lives in [place], Trimble lives in [place], and [place] lives in [place].      ADVANCED DIRECTIVES:  Daughters Deanna Washington [phone]) and Deanna Washington [phone]) are health care powers of attorney.  No living will or advance directives in place at this time.  Had lengthy discussion with patient and daughter, she does not want resuscitation in the event of cardiac arrest.    HEALTH MAINTENANCE: Social History  Substance Use Topics  . Smoking status: Former Research scientist (life sciences)  . Smokeless tobacco: Never Used     Comment: quit 25 years ago  .  Alcohol use 1.2 oz/week    2 Glasses of wine per week     Comment: 2 glasses a day     Colonoscopy:  PAP:  Bone density:   No Known Allergies  Current Outpatient Prescriptions  Medication Sig Dispense Refill  . ADVAIR DISKUS 500-50 MCG/DOSE AEPB Inhale 1 puff into the lungs 2 (two) times daily. 60 each 6  . amiodarone (PACERONE) 200 MG tablet Take 0.5 tablets (100 mg total) by mouth 2 (two) times daily. 30 tablet 5  . atorvastatin (LIPITOR) 20 MG tablet Take 1 tablet (20 mg total) by mouth daily. (Patient not taking: Reported on 10/16/2016) 30 tablet 0  . dexamethasone (DECADRON) 4 MG tablet Take 2 tablets (8 mg total) by mouth 2 (two) times daily with a meal. (Patient taking differently: Take 4-8 mg by mouth 3 (three) times daily. Take 25m by mouth in the morning, 449min the afternoon, and 17m51mt bedtime) 120 tablet 0  . diphenhydrAMINE (BENADRYL) 25 mg capsule Take 25 mg by mouth at bedtime as needed for sleep.     . furosemide (LASIX) 20 MG tablet Take 1 tablet (20 mg total) by mouth daily. 30 tablet 0  . HYDROcodone-acetaminophen (NORCO/VICODIN) 5-325 MG tablet Take 1 tablet by mouth every 4 (four) hours as needed for moderate pain. 30 tablet 0  . lapatinib (TYKERB) 250 MG tablet Take 3 tablets (750 mg total) by mouth daily. Take on an empty stomach, 1 hour before or 2 hours after a meal 90 tablet 0  . levETIRAcetam (KEPPRA) 500 MG tablet Take 1 tablet (500 mg total) by mouth 2 (two) times daily. 60 tablet 1  . LORazepam (ATIVAN) 0.5 MG tablet 1 tablet po 30 minutes prior to radiation or MRI 30 tablet 0  . nystatin (MYCOSTATIN) 100000 UNIT/ML suspension Take 5 mLs (500,000 Units total) by mouth 4 (four) times daily. 473 mL 0  . ondansetron (ZOFRAN) 4 MG tablet Take 1 tablet (4 mg total) by mouth every 4 (four) hours as needed for nausea or vomiting. 20 tablet 0  . pantoprazole (PROTONIX) 40 MG tablet Take 1 tablet (40 mg total) by mouth daily. 30 tablet 0  . PROAIR HFA 108 (90 Base)  MCG/ACT inhaler Place 1-2 puffs into alternate nostrils every 4 (four) hours as needed for wheezing.     No current facility-administered medications for this visit.     OBJECTIVE:  There were no vitals filed for this visit.   There is no height or weight on file to calculate BMI.   Wt Readings from Last 3 Encounters:  09/01-Oct-20181 lb 10.8 oz (68.8 kg)  10/25/2016 171 lb (77.6 kg)  10/03/16 171 lb (77.6 kg)      ECOG FS:2 - Symptomatic, <50% confined to bed  GENERAL: Patient is a chronically ill appearing older female in no apparent distress HEENT:  Sclerae anicteric. PERRL. Oropharynx clear and moist. No  ulcerations or evidence of oropharyngeal candidiasis. Neck is supple.  NODES:  No cervical, supraclavicular, or axillary lymphadenopathy palpated.  BREAST EXAM:  Deferred. LUNGS:  Clear to auscultation bilaterally.  No wheezes or rhonchi. HEART:  Regular rate and rhythm. No murmur appreciated. ABDOMEN:  Soft, nontender.  Positive, normoactive bowel sounds. No organomegaly palpated. MSK:  No focal spinal tenderness to palpation. Full range of motion bilaterally in the upper extremities. EXTREMITIES:  BLE edema present, AE hose on  SKIN:  Clear with no obvious rashes or skin changes. No nail dyscrasia. NEURO:  Nonfocal. Well oriented.  Appropriate affect.    LAB RESULTS:  CMP     Lab Results  Component Value Date   WBC 14.0 (H) 10/15/2016   NEUTROABS 12.7 (H) 10/21/2016   HGB 15.5 (H) 10/18/2016   HCT 44.3 10/24/2016   MCV 90.8 10/21/2016   PLT 335 10/24/2016      Chemistry      Component Value Date/Time   NA 132 (L) 10/26/2016 0525   NA 135 (L) 10/16/2016 0814   K 3.5 10/26/2016 0525   K 3.9 10/16/2016 0814   CL 94 (L) 10/26/2016 0525   CO2 25 10/26/2016 0525   CO2 22 10/16/2016 0814   BUN 34 (H) 10/26/2016 0525   BUN 18.8 10/16/2016 0814   CREATININE 0.92 10/26/2016 0525   CREATININE 0.7 10/16/2016 0814      Component Value Date/Time   CALCIUM 9.2  10/26/2016 0525   CALCIUM 8.6 10/16/2016 0814   ALKPHOS 94 10/11/2016 1548   ALKPHOS 104 10/16/2016 0814   AST 25 09/29/2016 1548   AST 12 10/16/2016 0814   ALT 67 (H) 09/30/2016 1548   ALT 43 10/16/2016 0814   BILITOT 0.6 10/25/2016 1548   BILITOT 0.50 10/16/2016 0814       Lab Results  Component Value Date   CA2729 174.7 (H) 09/20/2016    No components found for: HGQUANT  Lab Results  Component Value Date   CEA1 3.5 09/20/2016        (this displays the last labs from the last 3 days)  Urinalysis    Component Value Date/Time   COLORURINE YELLOW 09/28/2016 1548   APPEARANCEUR HAZY (A) 09/29/2016 1548   LABSPEC 1.010 10/23/2016 1548   PHURINE 7.0 09/28/2016 1548   GLUCOSEU NEGATIVE 10/05/2016 1548   [place] 10/25/2016 [place] 10/16/2016 [place] 10/09/2016 1548   PROTEINUR NEGATIVE 10/07/2016 1548   NITRITE NEGATIVE 10/08/2016 1548   LEUKOCYTESUR NEGATIVE 10/21/2016 1548     STUDIES: Ct Head Wo Contrast  Result Date: 10/25/2016 CLINICAL DATA:  Craniotomy for tumor resection. Increased swelling at operative site. EXAM: CT HEAD WITHOUT CONTRAST TECHNIQUE: Contiguous axial images were obtained from the base of the skull through the vertex without intravenous contrast. COMPARISON:  Brain MRI from 3 days ago FINDINGS: Brain: Low-density resection cavity in the right occipital lobe. There is small extra-axial fluid accumulation without significant mass effect. Progressive low-density subgaleal fluid collection around the bone flap. No evidence of acute infarct, acute hemorrhage, hydrocephalus, or shift. Patient has metastatic and ischemic findings by brain MRI, not visible on this scan Vascular: Atherosclerosis.  No hyperdense vessel. Skull: Stable positioning of right occipital parietal craniotomy flap. Sinuses/Orbits: No acute finding.  Bilateral cataract resection IMPRESSION: 1. Progressive subgaleal fluid collection around the  right occipital bone flap, presumably CSF. 2. No acute intracranial finding. 3. Ischemic and metastatic findings in the brain by recent MRI  that are not detectable or progressive by CT. Electronically Signed   By: Monte Fantasia M.D.   On: 10/25/2016 08:12   Ct Head Wo Contrast  Result Date: 10/19/2016 CLINICAL DATA:  Expressive aphasia, lethargy, difficulty standing, LEFT peripheral vision loss for 2 days. History of metastatic breast cancer on chemo radiation. Status post debulking of RIGHT occipital tumor. EXAM: CT HEAD WITHOUT CONTRAST TECHNIQUE: Contiguous axial images were obtained from the base of the skull through the vertex without intravenous contrast. COMPARISON:  MRI of the head October 04, 2016 and CT HEAD September 18, 2016 FINDINGS: BRAIN: RIGHT occipital resection cavity, re-expanded RIGHT occipital horn. No significant residual mass effect, surrounding low-density encephalomalacia. Moderate parenchymal brain volume loss for age. Patchy supratentorial white matter hypodensities, some of which reflect metastatic disease characterized on prior MRI. Subcentimeter LEFT temporal lobe metastasis better seen on prior MRI. No acute large vascular territory infarct. No intraparenchymal hemorrhage. No extra-axial fluid collections. Basal cisterns are patent. VASCULAR: Mild to moderate calcific atherosclerosis of the carotid siphons. SKULL: Status post RIGHT parieto-occipital craniotomy. RIGHT calvarial suspected metastasis better characterized on prior MRI. RIGHT parieto-occipital scalp fluid collection measuring 6.2 x 1.4 cm. Borderline partially empty sella. SINUSES/ORBITS: Mild paranasal sinus mucosal thickening without air-fluid levels. Mastoid air cells are well aerated. The included ocular globes and orbital contents are non-suspicious. Status post bilateral ocular lens implants. OTHER: None. IMPRESSION: 1. Interval RIGHT craniotomy for debulking occipital lobe tumor, improved appearance with  decreased mass effect and re-expanded RIGHT occipital horn. 2. 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 3. Moderate white matter changes consistent chronic small vessel ischemic disease and known metastasis. Electronically Signed   By: Elon Alas M.D.   On: 10/08/2016 16:47   Ct Angio Chest Pe W Or Wo Contrast  Result Date: 10/23/2016 CLINICAL DATA:  Short of breath, recent surgery EXAM: CT ANGIOGRAPHY CHEST WITH CONTRAST TECHNIQUE: Multidetector CT imaging of the chest was performed using the standard protocol during bolus administration of intravenous contrast. Multiplanar CT image reconstructions and MIPs were obtained to evaluate the vascular anatomy. CONTRAST:  75 mL Isovue 370 intravenous COMPARISON:  10/16/2016, CT chest 09/19/2016 FINDINGS: Cardiovascular: Satisfactory opacification of the pulmonary arteries to the segmental level. No evidence of pulmonary embolism. Ectatic ascending aorta, measuring up to 3.7 cm. No dissection is seen. Atherosclerotic calcifications. Enlarged pulmonary trunk, measuring up to 3.9 cm. Coronary artery calcification. Borderline cardiomegaly. No significant pericardial effusion. Mediastinum/Nodes: Re- demonstrated mediastinal adenopathy with increased size of previously noted abnormal lymph nodes. For example AP window soft tissue mass measures 3.9 x 2.3 cm, compared with 3.4 x 2.1 cm previously. Subcarinal mass measures 2.7 cm compared with 2.5 cm previously. Similar appearance of small right hilar nodes. Midline trachea. No thyroid mass. Esophagus unremarkable. Lungs/Pleura: Interim development of multifocal ground-glass densities throughout the lungs, most notable in the right upper lobe. Multiple bilateral pulmonary nodules, also worrisome for metastatic disease with interval increase in size of several previously noted lesions, for example right middle lobe mass measures 2.2 x 1.6 cm, compared with 1.1  x 1.2 cm previously. A right central lung base mass measures 1.8 x 1.3 cm, compared with 1.5 x 1.2 cm previously. Right lower lobe/azygoesophageal mass measures 4.3 x 3 cm compared with 4.2 x 3.1 cm. Interval increase in multiple small pulmonary nodules in the right lower lobe and middle lobes. Increased contiguity of multiple left lower lobe pulmonary nodules. No pleural effusion or pneumothorax. Upper Abdomen: No  acute abnormality. Musculoskeletal: Stable small sclerotic foci in T1 and T3. Review of the MIP images confirms the above findings. IMPRESSION: 1. Negative for acute pulmonary embolus or aortic dissection 2. Slight worsening of mediastinal metastatic adenopathy concerning for disease progression 3. Innumerable pulmonary nodules consistent with metastatic disease, increased number of nodules and size of pre-existing nodules also concerning for progression of disease. Interim development of multifocal ground-glass density within the bilateral lungs most notable in the right upper lobe, could reflect superimposed multifocal pneumonia. Aortic Atherosclerosis (ICD10-I70.0). Electronically Signed   By: Donavan Foil M.D.   On: 10/23/2016 20:27   Mr Brain Wo Contrast (neuro Protocol)  Result Date: 10/12/2016 CLINICAL DATA:  Altered level consciousness. History of metastatic breast cancer. Status post recent bulking of RIGHT occipital lobe metastasis. EXAM: MRI HEAD WITHOUT CONTRAST TECHNIQUE: Multiplanar, multiecho pulse sequences of the brain and surrounding structures were obtained without intravenous contrast. COMPARISON:  CT HEAD October 22, 2016 at 1612 hours and MRI of the head October 04, 2016 and August 23rd 2018 and FINDINGS: BRAIN: Multiple new subcentimeter foci of reduced diffusion RIGHT cerebellum with low ADC values. Stable subcentimeter reduced diffusion and splenium of corpus callosum and RIGHT medial parietal lobe without ADC abnormality, possible mild nonenhancing tumor. Increasing  fourth ventricle periventricular FLAIR T2 hyperintensities. Re- demonstration of LEFT cerebellar metastasis. Status post debulking of RIGHT occipital lobe tumor, with surrounding presumable gliosis and, potential residual tumor without mass effect. Susceptibility artifact within and along the margin of the resection cavity. No midline shift. Re-expanded RIGHT occipital horn and RIGHT atrium. No hydrocephalus. Similar patchy to confluent supratentorial white matter FLAIR T2 hyperintensities. LEFT parietal cortical T2 hyperintensity corresponding to known metastasis. No abnormal extra-axial fluid collections. Focal dural thickening subjacent to craniotomy. VASCULAR: Normal major intracranial vascular flow voids present at skull base. SKULL AND UPPER CERVICAL SPINE: Scattered mildly expansile T2 bright foci within RIGHT calvarial diploic space concerning for metastatic disease. Status post RIGHT parieto-occipital craniotomy. Partially empty sella. The Craniocervical junction maintained. SINUSES/ORBITS: Trace mastoid effusions. Mild paranasal sinus mucosal thickening without air-fluid levels. The included ocular globes and orbital contents are non-suspicious. Status post bilateral ocular lens implants. OTHER: Larger RIGHT parietoccipital T2 bright fluid collection with susceptibility artifact overlying craniotomy, predominately of loosening signal on FLAIR sequence. IMPRESSION: 1. Acute subcentimeter RIGHT cerebellar infarcts, less likely new metastasis. 2. Interval debulking of RIGHT occipital tumor with improved appearance, residual edema and potential tumor ; limited assessment without contrast. No residual mass effect or midline shift. 3. 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 4. Additional intracranial metastasis better characterized on prior MRI. 5. Moderate chronic small vessel ischemic disease. Electronically Signed   By: Elon Alas M.D.   On: 10/14/2016 17:27   Dg  Chest Port 1 View  Result Date: 10/09/2016 CLINICAL DATA:  Bilateral lower extremity swelling. EXAM: PORTABLE CHEST 1 VIEW COMPARISON:  09/18/2016. FINDINGS: Mediastinum and hilar structures normal. Cardiomegaly with mild bilateral interstitial prominence and small bilateral pleural effusions findings consistent with congestive heart failure. Surgical clips right chest. IMPRESSION: Congestive heart failure with bilateral pulmonary interstitial edema and small bilateral pleural effusions similar findings noted on prior study of 09/18/2016. Electronically Signed   By: Marcello Moores  Register   On: 10/12/2016 15:22      ASSESSMENT: 76 y.o.  [facility] woman presenting with headaches, dizziness, and vomiting 09/18/2016, initial noncontrasted CT showing a large right parieto-occipital hypodensity, status post inguinal node biopsy 09/20/2016 with results pending  (1) status post  right lumpectomy 2010 in [place], followed by radiation; no chemotherapy or anti-estrogens  METASTATIC DISEASE: August 2018 (2) brain MRI 09/19/2016 shows 3 brain masses the largest measuring 6.4 cm, involving right cerebrum, cerebellum, and left parietal cortex; CT of the chest abdomen and pelvis 09/19/2016 shows multiple bilateral pulmonary nodules, small bilateral effusions, mediastinal,subcarinal and aortopulmonary window adenopathy, no liver or bone involvement (a) right inguinal lymph node core biopsy 09/20/2016, results pending             (b) CA 27-29 is informative (this means we are dealing with breast cancer)  (c) CT chest/abd/pelvis 09/19/2016 demonstrated pulmonary metastatic disease and fairly extensive necrotic  appearing mediastinal and hilar lymphadenopathy.    (3) Radiation with Dr. Lisbeth Renshaw is pending  (4) Lapatinib '750mg'$  daily started on 10/16/2016  (5) Abraxane and Trastuzumab following radiation  PLAN: Deanna Washington, and I spent a great deal of time talking about her cancer,  and the plan as Dr. Jana Hakim and I reviewed it.  He also came in and spoke with the patient and Deanna Washington as well.  Her plan is to undergo radiation with Dr. Lisbeth Renshaw and start on Lapatinib.  I sent a prescription of Lapatinib '250mg'$  tablets, 3 tablets per day to [facility].  I called our oral oncology pharmacist Johny Drilling as well and informed her about the patient. Vaishnavi will also undergo treatment with Abraxane and Trastuzumab once she has completed her radiation.  She will start this in late October.  I went ahead and put in the request for her to go to chemo class, and recommended that she bring one of her children to it as well.  She does not want a port initially, so we will not place it with the understanding that she will need to receive her treatment peripherally and we may have to place the port in the future if we have IV access issues.  She did have an echocardiogram done on 09/20/2016 and it demonstrated a LVEF of 60-65%.  I reviewed that she will need to follow up with our cardio-oncology clinic every 3 months for echocardiograms and evaluation for monitoring of heart.  I reviewed that Trastuzumab can weaken her heart, so we have to keep a watchful eye over it and our cardio-oncology team is excellent to do this.  I gave her detailed information in her AVS about Abraxane, Lapatinib, and Trastuzumab.  In her CT scans there were some upper thoracic sclerotic lesions that were noted, and I ordered a bone scan to better evaluate.  The patient and her daughter verbalize understanding with this plan.    Dispo:  -Follow up October 12 with Dr. Jana Hakim for lab and appointment   Deanna Washington has a good understanding of the overall plan. She agrees with it. She knows the goal of treatment in her case is cure. She will call with any problems that may develop before her next visit here.   Chauncey Cruel, MD   11/07/2016 2:41 PM Medical Oncology and Hematology [facility] [address] [place], [place] [address] Tel. [phone]    Fax. [phone]   ADDENDUM: I discussed the pathology results with Deanna Washington and her daughter. It is very favorable that her tumor is HER-2 positive. This means we do not have to lean completely on chemotherapy, but can use anti-HER-2 treatment.  In addition to Herceptin she will benefit from lapatinib. We can actually start that now, at 750 mg daily. She was cautioned regarding the  possible toxicities, side effects and complications of this agent and she will let us know if particularly diarrhea occurs.  Otherwise as soon as she is done with her radiation treatments she will see [place] again and we will get going on chemotherapy/anti-HER-2 therapy.   I personally saw this patient and performed a substantive portion of this encounter with the listed APP documented above.   Chauncey Cruel, MD Medical Oncology and Hematology [facility] [address] [place], [place] [address] Tel. [phone]    Fax. [phone]

## 2016-11-08 ENCOUNTER — Ambulatory Visit: Payer: Medicare Other

## 2016-11-08 ENCOUNTER — Ambulatory Visit: Payer: Medicare Other | Admitting: Oncology

## 2016-11-08 ENCOUNTER — Other Ambulatory Visit: Payer: Medicare Other

## 2016-11-11 ENCOUNTER — Ambulatory Visit: Payer: Medicare Other

## 2016-11-12 ENCOUNTER — Ambulatory Visit: Payer: Medicare Other

## 2016-11-13 ENCOUNTER — Ambulatory Visit: Payer: Medicare Other

## 2016-11-14 ENCOUNTER — Ambulatory Visit: Payer: Medicare Other

## 2016-11-15 ENCOUNTER — Ambulatory Visit: Payer: Medicare Other

## 2018-03-03 IMAGING — US US BIOPSY
1 series · 13 of 25 positions shown · non-contrast
Comparison: none

INDICATION: METASTATIC DISEASE, BRAIN METASTASES, INGUINAL ADENOPATHY, UNKNOWN
PRIMARY

[Series 1: us biopsy · 0.06mm/px · 13 of 25 slices shown]
[im 1/25]
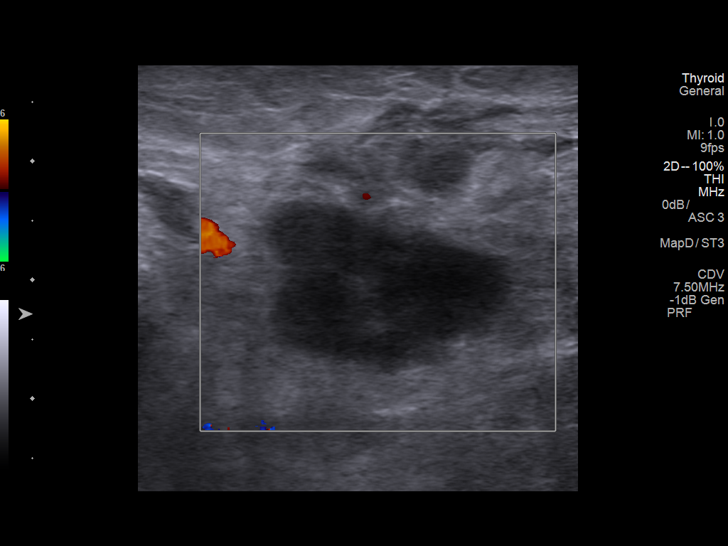
[im 3/25]
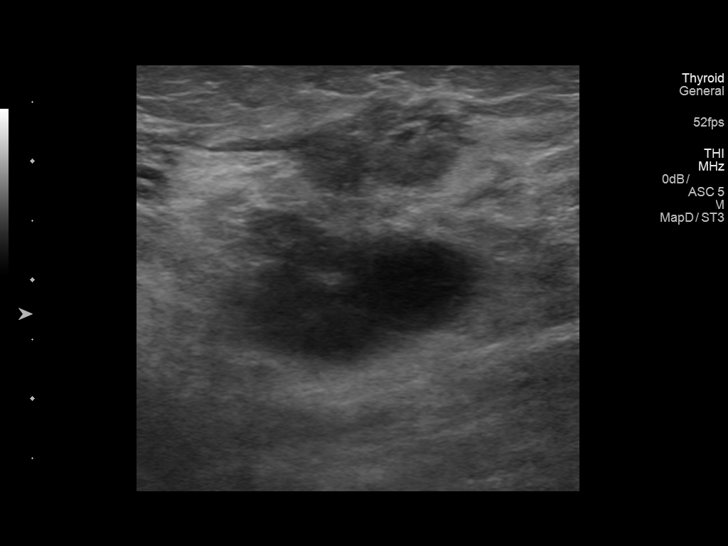
[im 5/25]
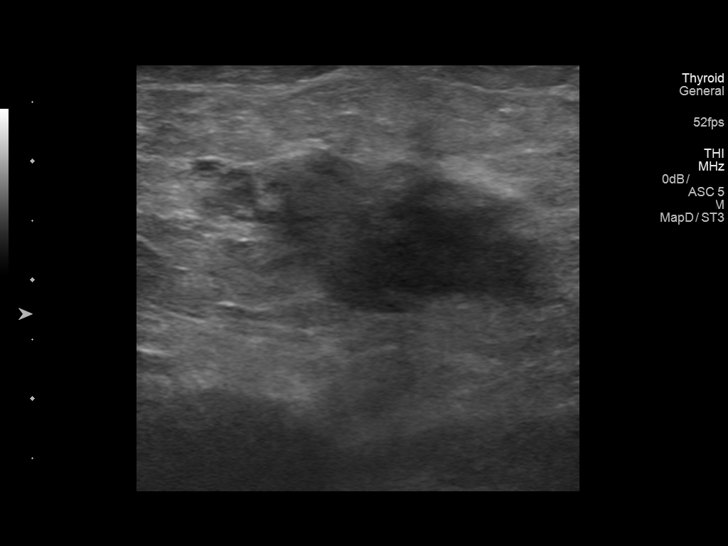
[im 7/25]
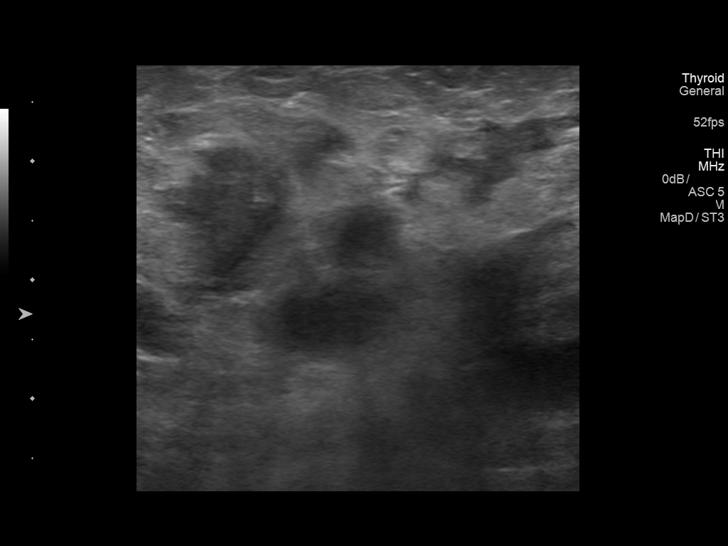
[im 9/25]
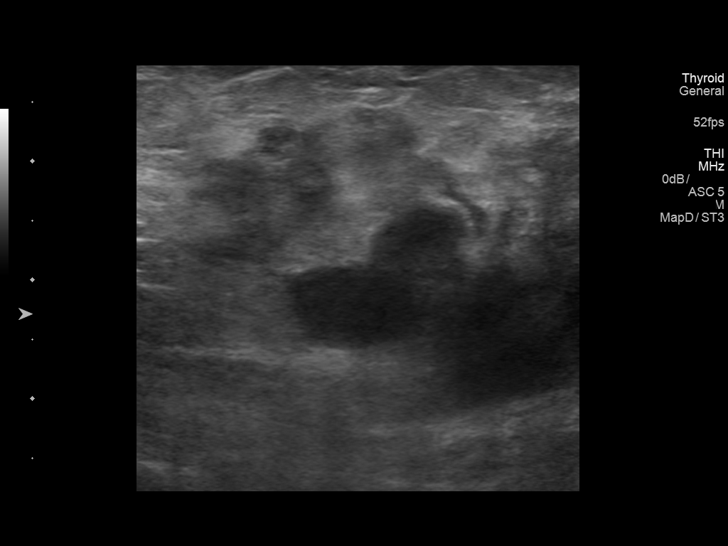
[im 11/25]
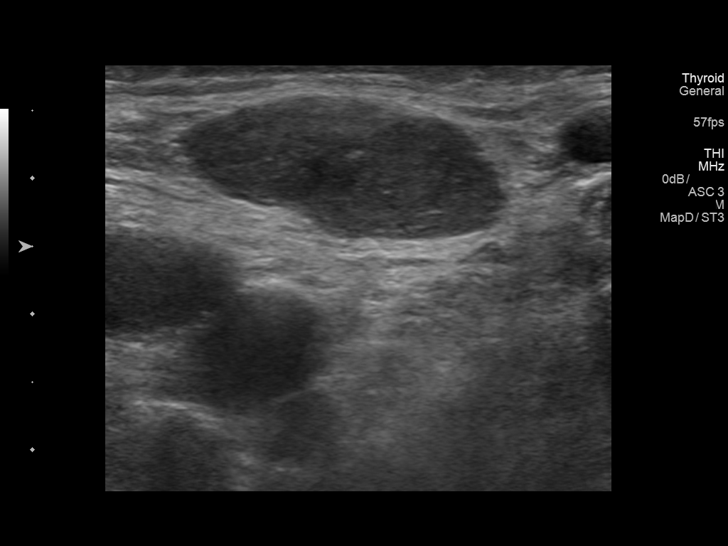
[im 13/25]
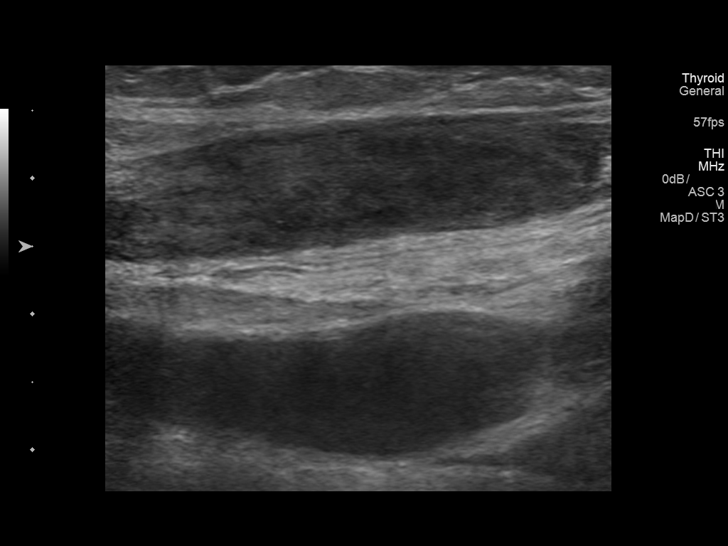
[im 15/25]
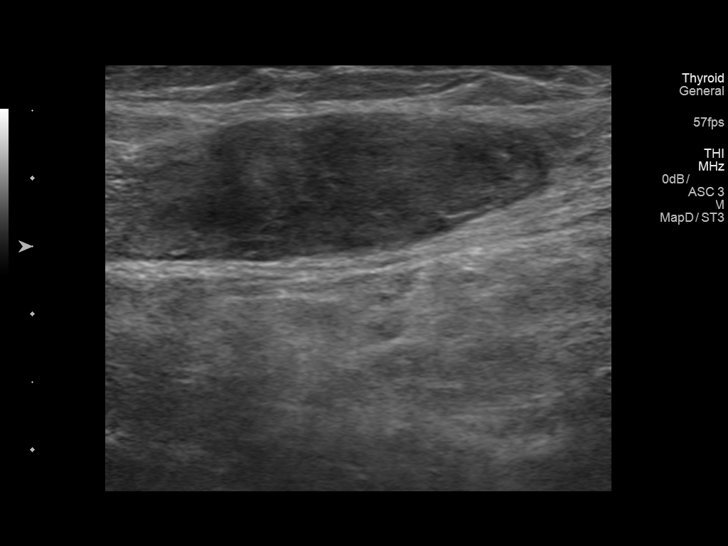
[im 17/25]
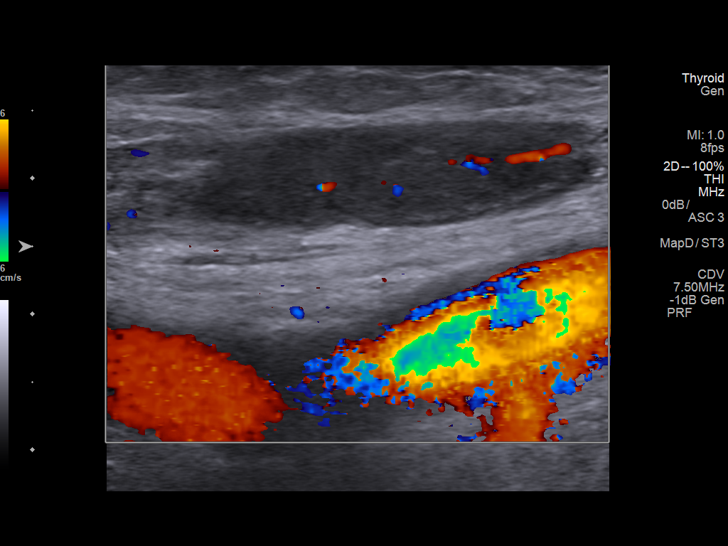
[im 19/25]
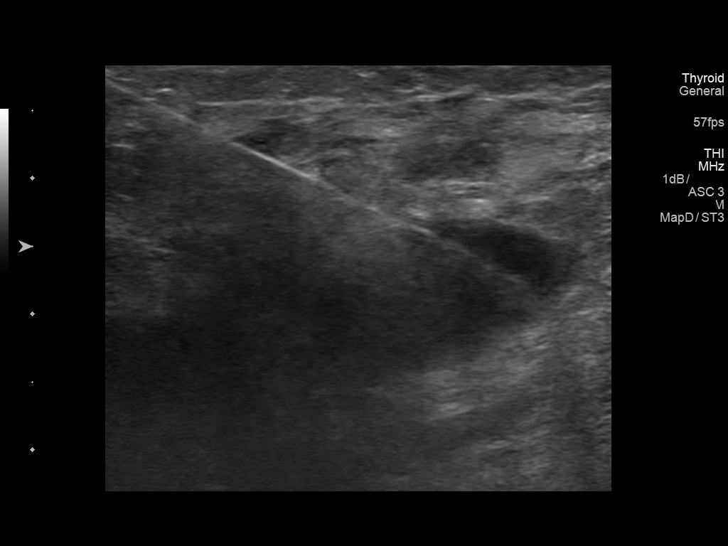
[im 21/25]
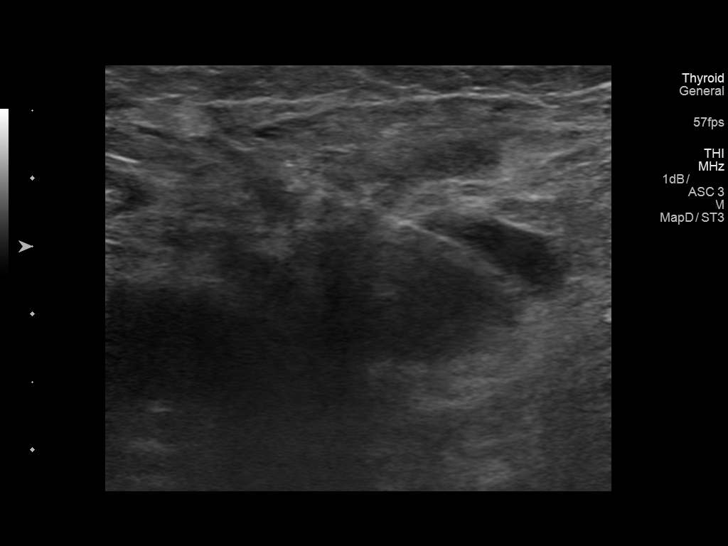
[im 23/25]
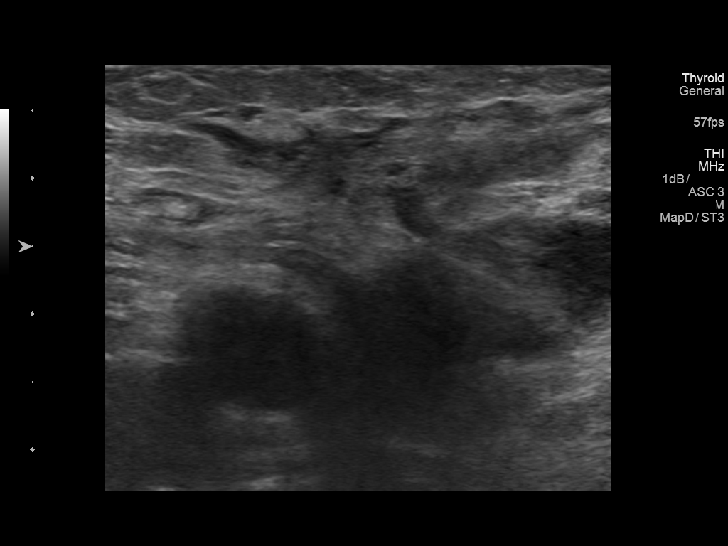
[im 25/25]
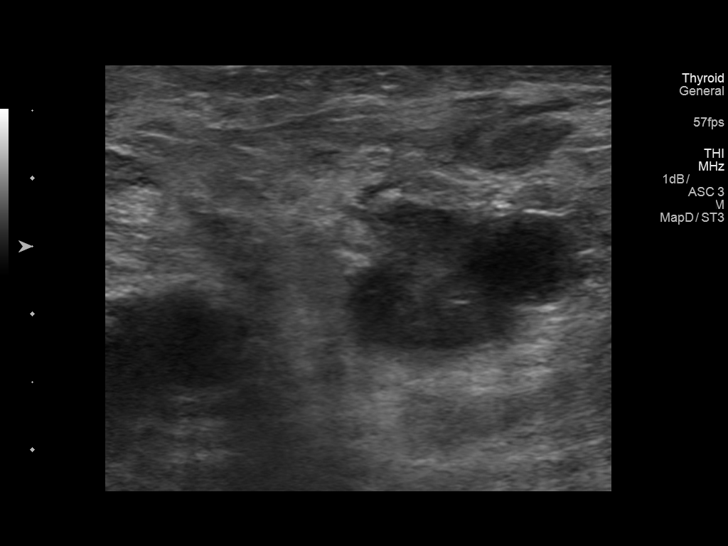

[13 of 25 positions shown; findings below may reference images not displayed]

EXAM:
ULTRASOUND RIGHT INGUINAL ADENOPATHY 18 GAUGE CORE BIOPSY

MEDICATIONS:
1% lidocaine local

ANESTHESIA/SEDATION:
Moderate (conscious) sedation was employed during this procedure. A
total of Versed 2.0 mg and Fentanyl 100 mcg was administered
intravenously.

Moderate Sedation Time: 10 Minutes. The patient's level of
consciousness and vital signs were monitored continuously by
radiology nursing throughout the procedure under my direct
supervision.

FLUOROSCOPY TIME:  Fluoroscopy Time: None.

COMPLICATIONS:
None immediate.

PROCEDURE:
Informed written consent was obtained from the patient after a
thorough discussion of the procedural risks, benefits and
alternatives. All questions were addressed. Maximal Sterile Barrier
Technique was utilized including caps, mask, sterile gowns, sterile
gloves, sterile drape, hand hygiene and skin antiseptic. A timeout
was performed prior to the initiation of the procedure.

Previous imaging reviewed. Preliminary ultrasound performed. Right
inguinal abnormal adenopathy localized. Overlying skin marked.

Under sterile conditions and local anesthesia, an 18 gauge core
biopsy medial was advanced to the right inguinal abnormal
adenopathy. 5 18 gauge core biopsies obtained. Samples placed in
saline. Needle removed. Postprocedure imaging demonstrates no
hemorrhage or hematoma. Patient tolerated the biopsy well. No
Complication.
IMPRESSION: Successful ultrasound right inguinal adenopathy 18 gauge core
biopsies

## 2019-07-20 IMAGING — CR DG CHEST 2V
2 series · 2 of 2 positions shown · non-contrast
Comparison: None.

CLINICAL DATA: Generalized body aches, fever, chills, nausea and
vomiting for 3 days.

EXAM:
CHEST  2 VIEW

[w chest lat]
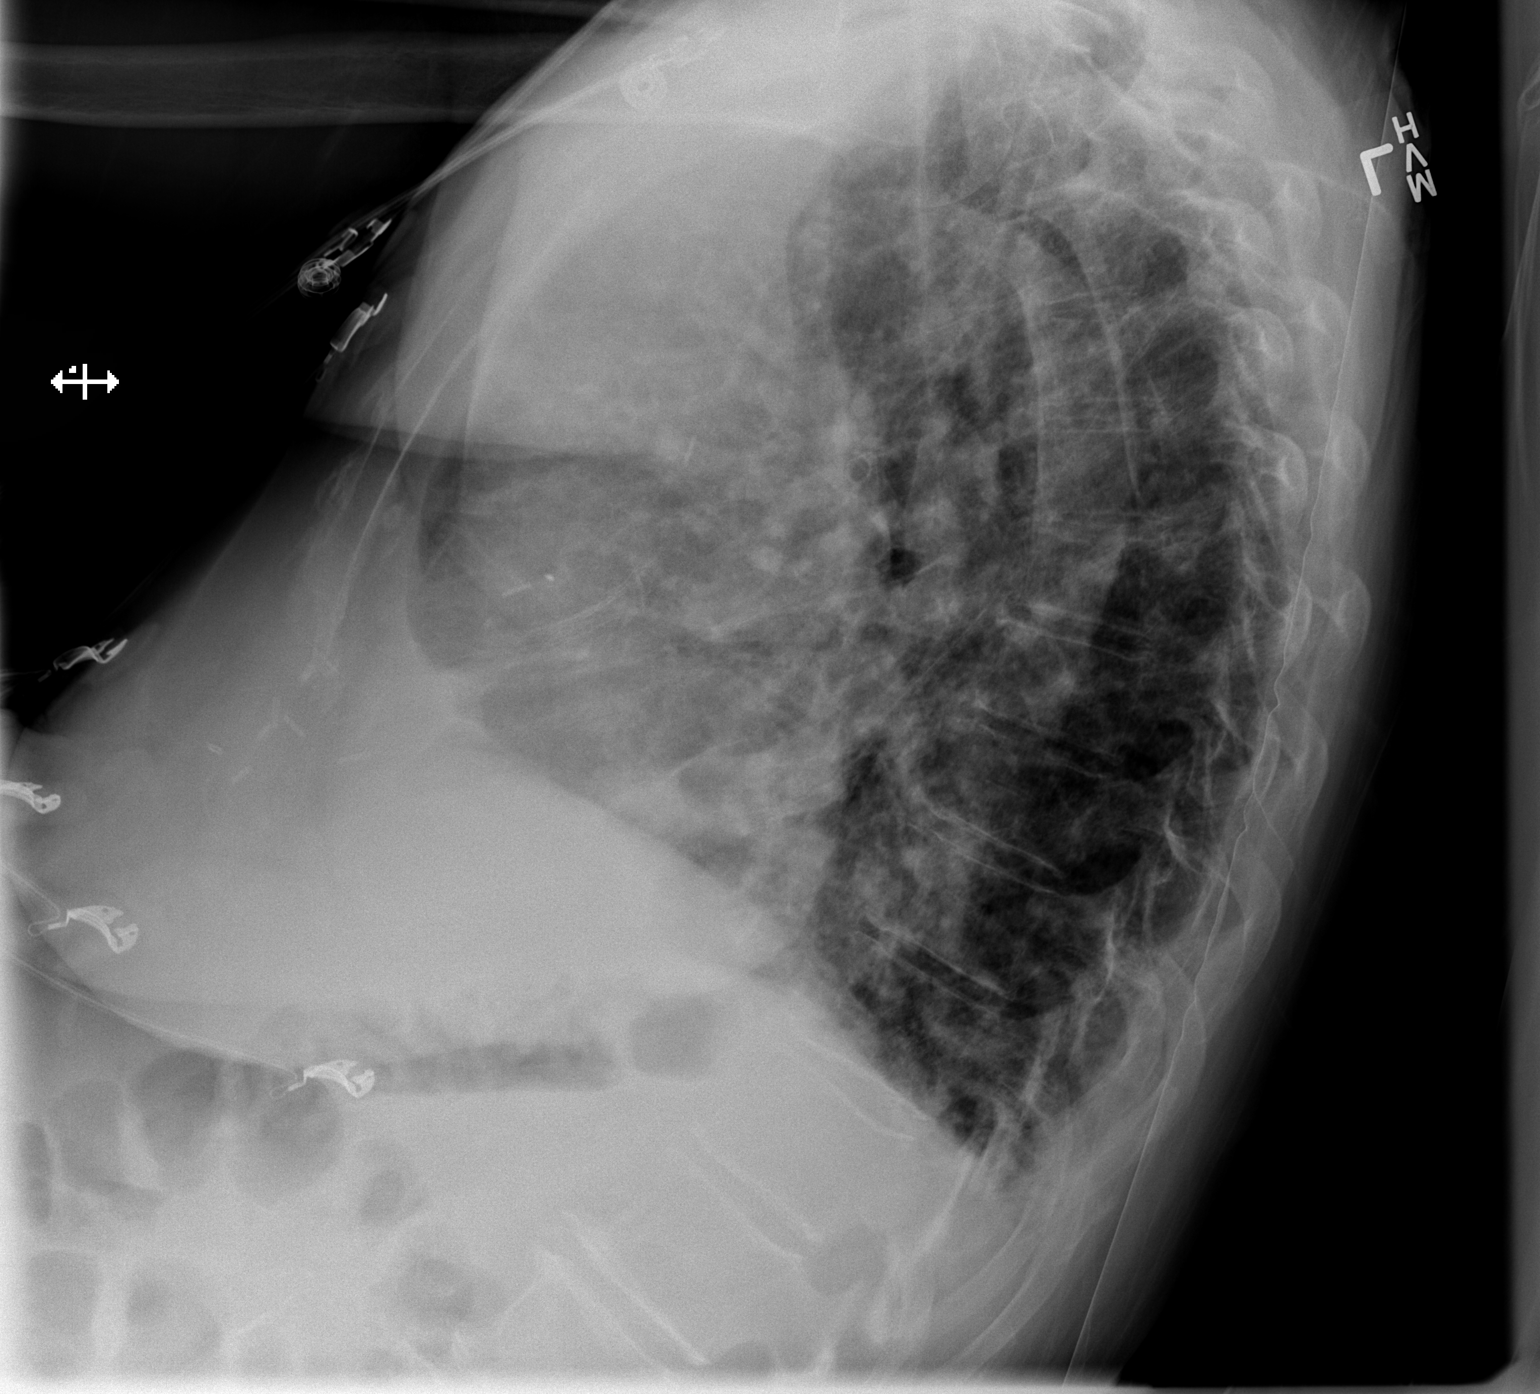

[x chest ap]
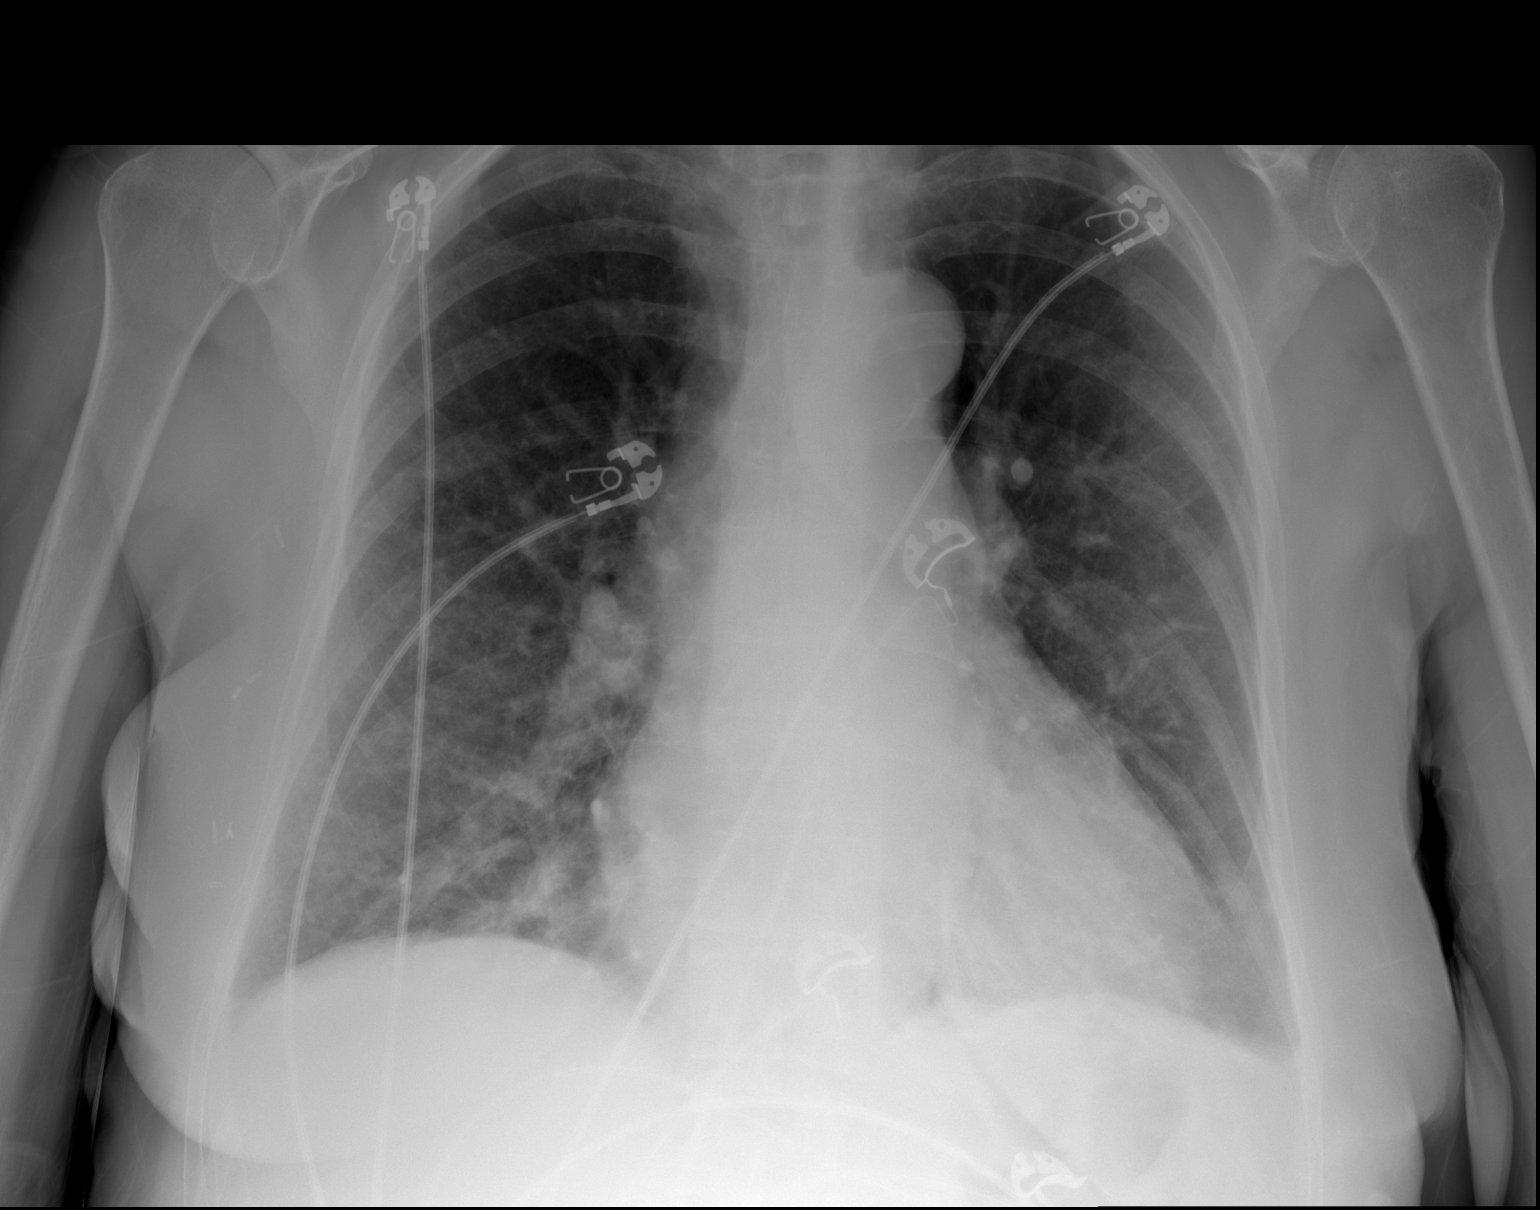

[2 of 2 positions shown; findings below may reference images not displayed]

FINDINGS: There is cardiomegaly and vascular congestion. No consolidative
process or pneumothorax. Trace bilateral pleural effusions noted. No
acute bony abnormality.
IMPRESSION: Cardiomegaly and pulmonary vascular congestion.

Trace bilateral pleural effusions.
